# Patient Record
Sex: Male | Born: 1950
Health system: Southern US, Community
[De-identification: ages and names within clinical notes are randomized; demographics above are authoritative.]

## PROBLEM LIST (undated history)

## (undated) DIAGNOSIS — Z8719 Personal history of other diseases of the digestive system: Secondary | ICD-10-CM

## (undated) DIAGNOSIS — C4491 Basal cell carcinoma of skin, unspecified: Secondary | ICD-10-CM

## (undated) DIAGNOSIS — C801 Malignant (primary) neoplasm, unspecified: Secondary | ICD-10-CM

## (undated) DIAGNOSIS — K219 Gastro-esophageal reflux disease without esophagitis: Secondary | ICD-10-CM

## (undated) DIAGNOSIS — E785 Hyperlipidemia, unspecified: Secondary | ICD-10-CM

## (undated) DIAGNOSIS — Z87442 Personal history of urinary calculi: Secondary | ICD-10-CM

## (undated) DIAGNOSIS — J309 Allergic rhinitis, unspecified: Secondary | ICD-10-CM

## (undated) DIAGNOSIS — E7800 Pure hypercholesterolemia, unspecified: Secondary | ICD-10-CM

## (undated) HISTORY — DX: Hyperlipidemia, unspecified: E78.5

## (undated) HISTORY — PX: TONSILLECTOMY: SUR1361

## (undated) HISTORY — DX: Malignant (primary) neoplasm, unspecified: C80.1

## (undated) HISTORY — PX: CHOLECYSTECTOMY: SHX55

## (undated) HISTORY — PX: SHOULDER SURGERY: SHX246

## (undated) HISTORY — DX: Pure hypercholesterolemia, unspecified: E78.00

## (undated) HISTORY — DX: Gastro-esophageal reflux disease without esophagitis: K21.9

## (undated) HISTORY — PX: TRIGGER FINGER RELEASE: SHX641

## (undated) HISTORY — DX: Basal cell carcinoma of skin, unspecified: C44.91

## (undated) HISTORY — DX: Allergic rhinitis, unspecified: J30.9

---

## 1999-12-31 ENCOUNTER — Ambulatory Visit (HOSPITAL_COMMUNITY): Admission: RE | Admit: 1999-12-31 | Discharge: 1999-12-31 | Payer: Self-pay | Admitting: Gastroenterology

## 2001-08-01 ENCOUNTER — Emergency Department (HOSPITAL_COMMUNITY): Admission: EM | Admit: 2001-08-01 | Discharge: 2001-08-01 | Payer: Self-pay | Admitting: *Deleted

## 2001-08-15 ENCOUNTER — Encounter: Payer: Self-pay | Admitting: Gastroenterology

## 2001-08-15 ENCOUNTER — Encounter: Admission: RE | Admit: 2001-08-15 | Discharge: 2001-08-15 | Payer: Self-pay | Admitting: Gastroenterology

## 2001-10-05 ENCOUNTER — Ambulatory Visit (HOSPITAL_BASED_OUTPATIENT_CLINIC_OR_DEPARTMENT_OTHER): Admission: RE | Admit: 2001-10-05 | Discharge: 2001-10-05 | Payer: Self-pay | Admitting: Urology

## 2001-10-05 ENCOUNTER — Encounter: Payer: Self-pay | Admitting: Urology

## 2003-04-25 ENCOUNTER — Ambulatory Visit (HOSPITAL_COMMUNITY): Admission: RE | Admit: 2003-04-25 | Discharge: 2003-04-25 | Payer: Self-pay | Admitting: Gastroenterology

## 2007-05-22 ENCOUNTER — Encounter: Payer: Self-pay | Admitting: Family Medicine

## 2008-05-02 ENCOUNTER — Encounter: Payer: Self-pay | Admitting: Family Medicine

## 2008-05-28 ENCOUNTER — Encounter: Payer: Self-pay | Admitting: Family Medicine

## 2008-12-04 ENCOUNTER — Encounter: Payer: Self-pay | Admitting: Family Medicine

## 2009-09-04 ENCOUNTER — Ambulatory Visit: Payer: Self-pay | Admitting: Family Medicine

## 2009-09-04 DIAGNOSIS — K219 Gastro-esophageal reflux disease without esophagitis: Secondary | ICD-10-CM

## 2009-09-04 DIAGNOSIS — J309 Allergic rhinitis, unspecified: Secondary | ICD-10-CM

## 2009-09-04 DIAGNOSIS — E785 Hyperlipidemia, unspecified: Secondary | ICD-10-CM

## 2009-09-04 HISTORY — DX: Gastro-esophageal reflux disease without esophagitis: K21.9

## 2009-09-04 HISTORY — DX: Hyperlipidemia, unspecified: E78.5

## 2009-09-04 HISTORY — DX: Allergic rhinitis, unspecified: J30.9

## 2010-01-24 DIAGNOSIS — C801 Malignant (primary) neoplasm, unspecified: Secondary | ICD-10-CM

## 2010-01-24 HISTORY — DX: Malignant (primary) neoplasm, unspecified: C80.1

## 2010-02-23 NOTE — Letter (Signed)
Summary: [facility]   Imported By: Sherian Rein 09/30/2009 13:38:33  _____________________________________________________________________  External Attachment:    Type:   Image     Comment:   External Document

## 2010-02-23 NOTE — Letter (Signed)
Summary: [facility]   Imported By: Sherian Rein 09/30/2009 13:37:25  _____________________________________________________________________  External Attachment:    Type:   Image     Comment:   External Document

## 2010-02-23 NOTE — Letter (Signed)
Summary: [facility]   Imported By: Sherian Rein 09/30/2009 13:39:55  _____________________________________________________________________  External Attachment:    Type:   Image     Comment:   External Document

## 2010-02-23 NOTE — Assessment & Plan Note (Signed)
Summary: to be est/njr   Vital Signs:  Patient profile:   60 year old male Height:      66.25 inches Weight:      169 pounds BMI:     27.17 Temp:     98.3 degrees F oral Resp:     16 per minute BP sitting:   120 / 74  (left arm) Cuff size:   regular  Vitals Entered By: Duard Brady LPN (September 04, 2009 1:59 PM)  Nutrition Counseling: Patient's BMI is greater than 25 and therefore counseled on weight management options. CC: new to establish  Is Patient Diabetic? No   History of Present Illness: Here to establish care.  Hx of GERD treated with Dexilant on as needed basis.  Symptoms currently stable. No hx of PUD.  Hx of reported hyperlidemia.  No hx of CAD  Recent rhinitis symptoms.  Freq nasal congestion. Symptoms not consistently relieved with oral  antihistamine.  Preventive Screening-Counseling & Management  Alcohol-Tobacco     Smoking Status: quit  Allergies (verified): 1)  ! Levaquin  Past History:  Family History: Last updated: 09/04/2009 Mother-  CVA and cardiac arrhythmia (?atrial fib)  Social History: Last updated: 09/04/2009 Occupation: Comptroller Married Former Smoker  Risk Factors: Smoking Status: quit (09/04/2009)  Past Medical History: GERD HYPERLIPIDEMIA ALLERGIC RHINITIS  Past Surgical History: Tonsillectomy L shoulder scromoplasty 1986, 2009 PMH-FH-SH reviewed for relevance  Family History: Mother-  CVA and cardiac arrhythmia (?atrial fib)  Social History: Occupation: Comptroller Married Former Smoker Smoking Status:  quit Occupation:  employed  Review of Systems  The patient denies anorexia, fever, weight loss, weight gain, chest pain, syncope, dyspnea on exertion, peripheral edema, prolonged cough, headaches, hemoptysis, abdominal pain, melena, hematochezia, and severe indigestion/heartburn.    Physical Exam  General:  Well-developed,well-nourished,in no acute distress; alert,appropriate and  cooperative throughout examination Head:  Normocephalic and atraumatic without obvious abnormalities. No apparent alopecia or balding. Eyes:  pupils equal, pupils round, and pupils reactive to light.   Mouth:  Oral mucosa and oropharynx without lesions or exudates.  Teeth in good repair. Neck:  No deformities, masses, or tenderness noted. Lungs:  Normal respiratory effort, chest expands symmetrically. Lungs are clear to auscultation, no crackles or wheezes. Heart:  normal rate, regular rhythm, and no murmur.   Extremities:  no edema. Neurologic:  alert & oriented X3, cranial nerves II-XII intact, and strength normal in all extremities.   Cervical Nodes:  No lymphadenopathy noted Psych:  normally interactive, good eye contact, not anxious appearing, and not depressed appearing.     Impression & Recommendations:  Problem # 1:  GERD (ICD-530.81)  His updated medication list for this problem includes:    Dexilant 60 Mg Cpdr (Dexlansoprazole) ..... One by mouth once daily  Problem # 2:  ALLERGIC RHINITIS (ICD-477.9) start Flonase. His updated medication list for this problem includes:    Fluticasone Propionate 50 Mcg/act Susp (Fluticasone propionate) .Marland Kitchen... 2 sprays per nostril once daily  Problem # 3:  DYSLIPIDEMIA (ICD-272.4) schedule CPE and reassess then.  Complete Medication List: 1)  Dexilant 60 Mg Cpdr (Dexlansoprazole) .... One by mouth once daily 2)  Fluticasone Propionate 50 Mcg/act Susp (Fluticasone propionate) .... 2 sprays per nostril once daily  Patient Instructions: 1)  Schedule CPE for October. Prescriptions: FLUTICASONE PROPIONATE 50 MCG/ACT SUSP (FLUTICASONE PROPIONATE) 2 sprays per nostril once daily  #1 x 6   Entered and Authorized by:   [name] MD   Signed by:  [name] MD on 09/04/2009   Method used:   Electronically to        [facility]  [place] [address] (retail)       [address]       [place], [place]  [address]       Ph: [phone] or  [phone]       Fax: [phone]   RxID:   [id number]    Preventive Care Screening  Colonoscopy:    Date:  08/25/2006    Results:  normal

## 2010-02-23 NOTE — Procedures (Signed)
Summary: Ccolonoscopy/[facility]  Ccolonoscopy/[facility]   Imported By: Sherian Rein 09/30/2009 13:36:00  _____________________________________________________________________  External Attachment:    Type:   Image     Comment:   External Document

## 2010-04-07 ENCOUNTER — Other Ambulatory Visit: Payer: Self-pay | Admitting: Family Medicine

## 2010-04-07 ENCOUNTER — Encounter (INDEPENDENT_AMBULATORY_CARE_PROVIDER_SITE_OTHER): Payer: Self-pay | Admitting: *Deleted

## 2010-04-07 ENCOUNTER — Other Ambulatory Visit: Payer: Self-pay

## 2010-04-07 DIAGNOSIS — Z0389 Encounter for observation for other suspected diseases and conditions ruled out: Secondary | ICD-10-CM

## 2010-04-07 DIAGNOSIS — Z0000 Encounter for general adult medical examination without abnormal findings: Secondary | ICD-10-CM

## 2010-04-07 DIAGNOSIS — E785 Hyperlipidemia, unspecified: Secondary | ICD-10-CM

## 2010-04-07 LAB — CBC WITH DIFFERENTIAL/PLATELET
Basophils Absolute: 0 10*3/uL (ref 0.0–0.1)
Basophils Relative: 0.2 % (ref 0.0–3.0)
Eosinophils Relative: 2.4 % (ref 0.0–5.0)
HCT: 43.5 % (ref 39.0–52.0)
Hemoglobin: 15.1 g/dL (ref 13.0–17.0)
Lymphocytes Relative: 30.6 % (ref 12.0–46.0)
Lymphs Abs: 1.7 10*3/uL (ref 0.7–4.0)
MCHC: 34.8 g/dL (ref 30.0–36.0)
MCV: 93.1 fl (ref 78.0–100.0)
Monocytes Relative: 7.3 % (ref 3.0–12.0)
Neutro Abs: 3.3 10*3/uL (ref 1.4–7.7)
RBC: 4.67 Mil/uL (ref 4.22–5.81)
RDW: 13 % (ref 11.5–14.6)
WBC: 5.5 10*3/uL (ref 4.5–10.5)

## 2010-04-07 LAB — URINALYSIS
Bilirubin Urine: NEGATIVE
Hgb urine dipstick: NEGATIVE
Ketones, ur: NEGATIVE
Leukocytes, UA: NEGATIVE
Nitrite: NEGATIVE
Specific Gravity, Urine: 1.015 (ref 1.000–1.030)
Total Protein, Urine: NEGATIVE
Urine Glucose: NEGATIVE
Urobilinogen, UA: 4 (ref 0.0–1.0)
pH: 8 (ref 5.0–8.0)

## 2010-04-07 LAB — HEPATIC FUNCTION PANEL
ALT: 16 U/L (ref 0–53)
AST: 21 U/L (ref 0–37)
Albumin: 4.1 g/dL (ref 3.5–5.2)
Alkaline Phosphatase: 55 U/L (ref 39–117)
Total Protein: 6.7 g/dL (ref 6.0–8.3)

## 2010-04-07 LAB — TSH: TSH: 1.09 u[IU]/mL (ref 0.35–5.50)

## 2010-04-07 LAB — BASIC METABOLIC PANEL
BUN: 18 mg/dL (ref 6–23)
Calcium: 9 mg/dL (ref 8.4–10.5)
Chloride: 106 mEq/L (ref 96–112)
Creatinine, Ser: 1 mg/dL (ref 0.4–1.5)
Glucose, Bld: 89 mg/dL (ref 70–99)
Potassium: 5.1 mEq/L (ref 3.5–5.1)
Sodium: 140 mEq/L (ref 135–145)

## 2010-04-07 LAB — LIPID PANEL
Cholesterol: 248 mg/dL — ABNORMAL HIGH (ref 0–200)
HDL: 35.7 mg/dL — ABNORMAL LOW (ref >39.00)
Total CHOL/HDL Ratio: 7
Triglycerides: 138 mg/dL (ref 0.0–149.0)

## 2010-04-20 ENCOUNTER — Encounter: Payer: Self-pay | Admitting: Family Medicine

## 2010-04-21 ENCOUNTER — Encounter: Payer: Self-pay | Admitting: Family Medicine

## 2010-04-21 ENCOUNTER — Ambulatory Visit (INDEPENDENT_AMBULATORY_CARE_PROVIDER_SITE_OTHER): Payer: PRIVATE HEALTH INSURANCE | Admitting: Family Medicine

## 2010-04-21 DIAGNOSIS — D239 Other benign neoplasm of skin, unspecified: Secondary | ICD-10-CM

## 2010-04-21 DIAGNOSIS — Z0000 Encounter for general adult medical examination without abnormal findings: Secondary | ICD-10-CM

## 2010-04-21 DIAGNOSIS — D229 Melanocytic nevi, unspecified: Secondary | ICD-10-CM

## 2010-04-21 DIAGNOSIS — E785 Hyperlipidemia, unspecified: Secondary | ICD-10-CM

## 2010-04-21 MED ORDER — ATORVASTATIN CALCIUM 20 MG PO TABS: 20.0000 mg | ORAL_TABLET | Freq: Every day | ORAL | Status: DC

## 2010-04-21 NOTE — Progress Notes (Signed)
  Subjective:    Patient ID: Dennis Villarreal, male    DOB: [birth year], 60 y.o.   MRN: [id number]  HPI Patient here for complete physical examination. Colonoscopy 2008. Last tetanus 2008.  Patient does not exercise regularly. Diet low in saturated fat. Family history reviewed. Mother died of complications of stroke. No history of premature CAD. The patient is an ex-smoker.  Father had melanoma.  Patient relates a several year history of pigmented lesion left side which he thinks has grown over the past 5 years but no itching or bleeding.  Patient has history of GERD which is controlled with PPI. Had recent endoscopy back in 2008. Seasonal allergies generally controlled with over-the-counter medication.   Review of Systems  Constitutional: Negative for fever, activity change, appetite change and fatigue.  HENT: Negative for ear pain, congestion and trouble swallowing.   Eyes: Negative for pain and visual disturbance.  Respiratory: Negative for cough, shortness of breath and wheezing.   Cardiovascular: Negative for chest pain and palpitations.  Gastrointestinal: Negative for nausea, vomiting, abdominal pain, diarrhea, constipation, blood in stool, abdominal distention and rectal pain.  Genitourinary: Negative for dysuria, hematuria and testicular pain.  Musculoskeletal: Negative for joint swelling and arthralgias.  Skin: Negative for rash.  Neurological: Negative for dizziness, syncope and headaches.  Hematological: Negative for adenopathy.  Psychiatric/Behavioral: Negative for confusion and dysphoric mood.       Objective:   Physical Exam  Constitutional: He is oriented to person, place, and time. He appears well-developed and well-nourished. No distress.  HENT:  Head: Normocephalic and atraumatic.  Right Ear: External ear normal.  Left Ear: External ear normal.  Mouth/Throat: Oropharynx is clear and moist.  Eyes: Conjunctivae and EOM are normal. Pupils are equal, round, and  reactive to light.  Neck: Normal range of motion. Neck supple. No thyromegaly present.  Cardiovascular: Normal rate, regular rhythm and normal heart sounds.   No murmur heard. Pulmonary/Chest: No respiratory distress. He has no wheezes. He has no rales.  Abdominal: Soft. Bowel sounds are normal. He exhibits no distension and no mass. There is no tenderness. There is no rebound and no guarding.  Musculoskeletal: He exhibits no edema.  Lymphadenopathy:    He has no cervical adenopathy.  Neurological: He is alert and oriented to person, place, and time. He displays normal reflexes. No cranial nerve deficit.  Skin: No rash noted.       Patient has pigmented lesions oval in shape left side which is about 9 mm x 1.5 cm. Fairly clear border but does have significant color variegation. He also states is growing in size of the past few years  Psychiatric: He has a normal mood and affect.          Assessment & Plan:  #1 health maintenance issues addressed. Discussed sun protection. Colonoscopy up to date. Labs significant for elevated total cholesterol and LDL with low HDL. See below #2 atypical pigmented lesion left side a patient with positive family history of melanoma. Given the growth pattern and abnormal features recommend further evaluation dermatologist for consideration of excision #3 hyperlipidemia check. Start Lipitor 20 mg daily and reassess lipids 2 months

## 2010-05-12 ENCOUNTER — Other Ambulatory Visit: Payer: Self-pay | Admitting: Family Medicine

## 2010-05-14 ENCOUNTER — Encounter: Payer: Self-pay | Admitting: Family Medicine

## 2010-05-19 ENCOUNTER — Encounter: Payer: Self-pay | Admitting: Family Medicine

## 2010-06-11 NOTE — Procedures (Signed)
[facility]  Patient:    Dennis Villarreal, Dennis Villarreal                       MRN: [id number] Proc. Date: 12/31/99 Adm. Date:  [id number] Attending:  Louie Bun CC:         Doreatha Lew, M.D.   Procedure Report  PROCEDURE:  Esophagogastroduodenoscopy with biopsy.  INDICATIONS FOR PROCEDURE:  Chronic gastroesophageal reflux disease, requiring daily proton pump inhibitor for the last three years.  DESCRIPTION OF PROCEDURE:  The patient was placed in the left lateral decubitus position and placed on the pulse monitor with continuous low-flow oxygen delivered by nasal cannula.  He was sedated with 50 mg IV Demerol and 5 mg IV Versed.  The Olympus video endoscope was advanced under direct vision into the oropharynx and esophagus.  The esophagus was straight and of normal caliber. At the squamocolumnar line at 36 cm, there is a 2 cm hiatal hernia.  The GE junction appeared somewhat patchulous, but there is no Barretts esophagus or visible erosions or endoscopic evidence of esophagitis.  No ring or stricture was present.  The stomach was entered, a small amount of liquid secretions were suctioned from the fundus.  Retroflexed view of the cardia confirmed the hiatal hernia and was otherwise unremarkable.  The fundus body, antrum and pylorus all appeared normal.  The duodenum was entered.  Both the bulb and second portion are well inspected and appear to be within normal limits.  The endoscope was then withdrawn into the stomach and a CLOtest obtained.  The scope was then withdrawn and the patient returned to the recovery room in stable condition.  He tolerated the procedure well and there were no immediate complications.  IMPRESSION:  A 2 cm hiatal hernia with patchulous gastroesophageal junction.  PLAN:  Continue proton pump inhibitor.  Reinforce dietary and lifestyle modifications for reflux.  Await CLOtest. DD:  12/31/99 TD:  01/01/00 Job:  [id number]

## 2010-06-11 NOTE — Op Note (Signed)
NAME:  Dennis Villarreal, Dennis Villarreal                        ACCOUNT NO.:  [id number]   MEDICAL RECORD NO.:  [id number]                   PATIENT TYPE:  AMB   LOCATION:  ENDO                                 FACILITY:  [facility]   PHYSICIAN:  John C. Madilyn Fireman, M.D.                 DATE OF BIRTH:  [birth year]   DATE OF PROCEDURE:  04/25/2003  DATE OF DISCHARGE:                                 OPERATIVE REPORT   PROCEDURE:  Colonoscopy.   INDICATION FOR PROCEDURE:  Family history of colon cancer in a second-degree  relative in a 60 year old patient with no prior colon screening.   PROCEDURE:  The patient was placed in the left lateral decubitus position  and placed no the pulse monitor with continuous low-flow oxygen delivered by  nasal cannula.  He was sedated with 87.5 mg IV fentanyl and 9 mg of IV  Versed.  The Olympus video colonoscope was inserted into the rectum and  advanced to the cecum, confirmed by transillumination of McBurney's point  and visualization of the ileocecal valve and appendiceal orifice.  The prep  was excellent.  The cecum, ascending, transverse, descending, and sigmoid  colon all appeared normal with no masses, polyps, diverticula, or other  mucosal abnormalities.  The rectum likewise appeared normal and retroflexed  view of the anus revealed no obvious internal hemorrhoids.  The colonoscope  was then withdrawn and the patient returned to the recovery room in stable  condition.  He tolerated the procedure well, and there were no immediate  complications.   IMPRESSION:  Normal colonoscopy.   PLAN:  Repeat colonoscopy in five years.                                               John C. Madilyn Fireman, M.D.    JCH/MEDQ  D:  04/25/2003  T:  04/26/2003  Job:  [id number]   cc:   Duwayne Heck L. Mahaffey, M.D.  [address]  [place]  [place] [address]  Fax: [phone]

## 2010-06-11 NOTE — Op Note (Signed)
Dennis Villarreal, Dennis Villarreal                       ACCOUNT NO.:  [id number]   MEDICAL RECORD NO.:  [id number]                   PATIENT TYPE:  AMB   LOCATION:  [facility]                                 FACILITY:  [facility]   PHYSICIAN:  Claudette Laws, M.D.               DATE OF BIRTH:  1950-07-03   DATE OF PROCEDURE:  10/05/2001  DATE OF DISCHARGE:                                 OPERATIVE REPORT   PREOPERATIVE DIAGNOSES:  Distal right ureteral calculus.   POSTOPERATIVE DIAGNOSES:  Distal right ureteral calculus, renal colic.   OPERATION:  Cystoscopy, balloon dilation right distal ureter, right rigid  ureteroscopy and stone basket extraction distal right ureteral calculus and  insertion of a 6 French 26 cm double J stent.   SURGEON:  Claudette Laws, M.D.   HISTORY OF PRESENT ILLNESS:  This patient who I last saw about six weeks ago  in the office with a small distal right ureteral calculus and intermittent  renal colic was seen, evaluated and felt that he could pass the stone  spontaneously. However since then, he has not passed the stone, he has had  intermittent symptoms of renal colic, and requested that we go ahead with a  procedure. I recommended ureteroscopy with stone basket. He understands and  agrees to the proposed surgery. I mentioned complications to him such as  postop ureteral stricture, inadvertent perforation of the ureter or  inability to extract the stone for whatever reason. I told him, he would be  left with a double J stent postop for three to four days postop. He  understands and agrees to the proposed surgery. He comes in as an outpatient  procedure.   DESCRIPTION OF PROCEDURE:  The patient was prepped and draped in the dorsal  lithotomy position under LMA anesthesia. Cystoscopy was performed with a 22  French rigid cystoscope. He had a normal anterior urethra, some early BPH,  elevation of the median lobe, normal bladder smooth, no tumors and no  calculi, normal  ureteral orifices.   Initially I intubated the distal right ureter with a 0.35 Glidewire. Using  fluoroscopic control, it was passed up to the kidney. Initially I tried to  intubate the ureter with a 6 French rigid ureteroscope but it was  unsuccessful so then we went ahead and balloon dilated the distal ureter a  with 4 cm balloon for about 5 minutes. I then reintroduced the ureteroscope  under direct vision and the stone was visualized. Using a small nitinol  basket, the stone was retrieved in the first pass. I then back loaded the  guidewire over the cystoscope and passed up a 6 French 26 cm double J stent  with fluoroscopic control. The proximal end was curled up in the renal  pelvis, the distal end was curled up in the bladder with the string attached  which was brought out through the urethra. All instruments were removed, the  bladder was emptied. The string was taped to the penis with pink tinctured  Benzoin and pink tape. A B&O suppository was placed per rectum for bladder  spasm and Xylocaine was introduced per urethra for anesthetic purposes. The  patient was then taken back to the recovery room in satisfactory condition.  The patient will go home now with plans to remove the double J stent via the  string in about three days.                                                Claudette Laws, M.D.   RFS/MEDQ  D:  10/05/2001  T:  10/06/2001  Job:  [id number]

## 2010-06-11 NOTE — Procedures (Signed)
[facility]  Patient:    RAYDAN, SCHLABACH                       MRN: [id number] Proc. Date: 12/31/99 Adm. Date:  [id number] Attending:  Louie Bun CC:         Doreatha Lew, M.D.   Procedure Report  PROCEDURE:  Flexible sigmoidoscopy.  INDICATIONS FOR PROCEDURE:  Constipation in a 60 year old patient with a history of colon cancer in a second-degree relative.  DESCRIPTION OF PROCEDURE:  The patient was placed in the left lateral decubitus position and placed on the pulse monitor with continuous low-flow oxygen delivered by nasal cannula.  He was sedated with 50 mg IV Demerol and 5 mg IV Versed for the previous EGD, and then was given 2 mg more of Versed for this procedure.  The Olympus video colonoscope was inserted into the rectum and advanced to approximately 45 cm.  Between 35 cm and 45 cm there was some solid stool which precluded visualization of some areas, and above 45 cm the prep was inadequate; the scope was not advanced beyond this point.  Otherwise, the descending, sigmoid and rectum appeared normal down to the anus.  There a retroflexed view revealed no obvious internal hemorrhoids.  The colonoscope was then withdrawn and the patient returned to the recovery room in stable condition.  He tolerated the procedure well and there were no immediate complications.  IMPRESSION:  Basically normal sigmoidoscopy to 45 cm. DD:  12/31/99 TD:  01/01/00 Job: [id number]

## 2010-06-22 ENCOUNTER — Encounter: Payer: Self-pay | Admitting: Family Medicine

## 2010-06-22 ENCOUNTER — Ambulatory Visit (INDEPENDENT_AMBULATORY_CARE_PROVIDER_SITE_OTHER): Payer: No Typology Code available for payment source | Admitting: Family Medicine

## 2010-06-22 VITALS — BP 110/78 | Temp 98.6°F | Wt 168.0 lb

## 2010-06-22 DIAGNOSIS — E785 Hyperlipidemia, unspecified: Secondary | ICD-10-CM

## 2010-06-22 LAB — LIPID PANEL
Cholesterol: 163 mg/dL (ref 0–200)
LDL Cholesterol: 98 mg/dL (ref 0–99)
Triglycerides: 118 mg/dL (ref 0.0–149.0)

## 2010-06-22 LAB — HEPATIC FUNCTION PANEL
AST: 24 U/L (ref 0–37)
Alkaline Phosphatase: 57 U/L (ref 39–117)
Bilirubin, Direct: 0.1 mg/dL (ref 0.0–0.3)
Total Protein: 6.6 g/dL (ref 6.0–8.3)

## 2010-06-22 NOTE — Progress Notes (Signed)
  Subjective:    Patient ID: Dennis Villarreal, male    DOB: 1950/05/19, 60 y.o.   MRN: [id number]  HPI Patient seen for followup hyperlipidemia. Recent complete physical. Total cholesterol 248. LDL 175. Initiated Lipitor 20 mg daily. Patient tolerating well. Compliant with therapy. No side effects. No prior history of CAD. Patient is an ex-smoker. Moderate risk factors.  Abnormal pigmented lesion noted at physical. Patient sent to dermatology and this turned out to be melanoma. He had some complications of post operative wound infection currently treated with cephalexin. Reportedly had a thickness of 0.4 mm   Review of Systems  Constitutional: Negative for activity change, fatigue and unexpected weight change.  Respiratory: Negative for shortness of breath.   Cardiovascular: Negative for chest pain and palpitations.       Objective:   Physical Exam  Constitutional: He is oriented to person, place, and time. He appears well-developed and well-nourished. No distress.  HENT:  Head: Normocephalic.  Right Ear: External ear normal.  Left Ear: External ear normal.  Mouth/Throat: Oropharynx is clear and moist. No oropharyngeal exudate.  Cardiovascular: Normal rate, regular rhythm and normal heart sounds.   No murmur heard. Pulmonary/Chest: Effort normal and breath sounds normal. No respiratory distress. He has no wheezes. He has no rales.  Musculoskeletal: He exhibits no edema.  Neurological: He is alert and oriented to person, place, and time. No cranial nerve deficit.          Assessment & Plan:  #1 hyperlipidemia. Recheck lipid and hepatic panel #2 malignant melanoma left chest wall. Followed by dermatology

## 2010-06-24 NOTE — Progress Notes (Signed)
Quick Note:  Pt informed ______ 

## 2010-07-29 ENCOUNTER — Ambulatory Visit: Payer: No Typology Code available for payment source | Admitting: Family Medicine

## 2010-07-30 ENCOUNTER — Ambulatory Visit: Payer: No Typology Code available for payment source | Admitting: Family Medicine

## 2010-07-30 ENCOUNTER — Encounter: Payer: Self-pay | Admitting: Family Medicine

## 2010-07-30 ENCOUNTER — Ambulatory Visit (INDEPENDENT_AMBULATORY_CARE_PROVIDER_SITE_OTHER): Payer: No Typology Code available for payment source | Admitting: Family Medicine

## 2010-07-30 DIAGNOSIS — K5900 Constipation, unspecified: Secondary | ICD-10-CM

## 2010-07-30 NOTE — Progress Notes (Signed)
  Subjective:    Patient ID: Dennis Villarreal, male    DOB: 03/30/1950, 60 y.o.   MRN: [id number]  HPI  Constipation off and on for 10 years. Especially worse over the past couple months. Drinks plenty of fluids. No regular exercise. TSH normal in March. Colonoscopy 2008. Eats fair number of high fiber foods. He has not taken any stool softeners or laxatives at this time. No bloody stools. Good appetite. No weight loss. No anticholinergic drugs.  Review of Systems  Constitutional: Negative for fever, chills, appetite change and unexpected weight change.  Gastrointestinal: Positive for constipation. Negative for nausea, vomiting, abdominal pain, blood in stool and abdominal distention.       Objective:   Physical Exam  Constitutional: He appears well-developed and well-nourished. No distress.  Cardiovascular: Normal rate, regular rhythm and normal heart sounds.   No murmur heard. Pulmonary/Chest: Effort normal and breath sounds normal. No respiratory distress. He has no wheezes. He has no rales. He exhibits no tenderness.  Abdominal: Soft. Bowel sounds are normal. He exhibits no distension and no mass. There is no tenderness. There is no rebound and no guarding.  Genitourinary:       Rectal exam normal sphincter tone. No impaction. No mass palpated          Assessment & Plan:  Constipation. Discussed measures including exercise, plenty of fluids. Increase fiber consumption, short-term use of senna or MiraLax

## 2010-07-30 NOTE — Patient Instructions (Signed)
Constipation in Adults Constipation is having fewer than 2 bowel movements per week. Usually, the stools are hard. As we grow older, constipation is more common. If you try to fix constipation with laxatives, the problem may get worse. This is because laxatives taken over a long period of time make the colon muscles weaker. A low-fiber diet, not taking in enough fluids, and taking some medicines may make these problems worse. MEDICATIONS THAT MAY CAUSE CONSTIPATION  Water pills (diuretics).  Calcium channel blockers (used to control blood pressure and for the heart).   Certain pain medicines (narcotics).   Anticholinergics.  Anti-inflammatory agents.   Antacids that contain aluminum.   DISEASES THAT CONTRIBUTE TO CONSTIPATION  Diabetes.  Parkinson's disease.   Dementia.   Stroke.  Depression.   Illnesses that cause problems with salt and water metabolism.   HOME CARE INSTRUCTIONS  Constipation is usually best cared for without medicines. Increasing dietary fiber and eating more fruits and vegetables is the best way to manage constipation.   Slowly increase fiber intake to 25 to 38 grams per day. Whole grains, fruits, vegetables, and legumes are good sources of fiber. A dietitian can further help you incorporate high-fiber foods into your diet.   Drink enough water and fluids to keep your urine clear or pale yellow.   A fiber supplement may be added to your diet if you cannot get enough fiber from foods.   Increasing your activities also helps improve regularity.   Suppositories, as suggested by your caregiver, will also help. If you are using antacids, such as aluminum or calcium containing products, it will be helpful to switch to products containing magnesium if your caregiver says it is okay.   If you have been given a liquid injection (enema) today, this is only a temporary measure. It should not be relied on for treatment of longstanding (chronic) constipation.    Stronger measures, such as magnesium sulfate, should be avoided if possible. This may cause uncontrollable diarrhea. Using magnesium sulfate may not allow you time to make it to the bathroom.  SEEK IMMEDIATE MEDICAL CARE IF:  There is bright red blood in the stool.   The constipation stays for more than 4 days.   There is belly (abdominal) or rectal pain.   You do not seem to be getting better.   You have any questions or concerns.  MAKE SURE YOU:  Understand these instructions.   Will watch your condition.   Will get help right away if you are not doing well or get worse.  Document Released: 10/09/2003 Document Re-Released: 04/06/2009 [facility] Patient Information 2011 [place], [place].  Try to exercise more consistently.  Consider over the counter such as Senna or Miralax.

## 2010-09-28 ENCOUNTER — Telehealth: Payer: Self-pay | Admitting: Family Medicine

## 2010-09-28 NOTE — Telephone Encounter (Signed)
Pt informed of referral

## 2010-09-28 NOTE — Telephone Encounter (Signed)
Will go ahead and set up with Dr Narda Bonds (ENT)

## 2010-09-28 NOTE — Telephone Encounter (Signed)
Pt requesting a referral for the constant ringing in his ears. ENT? Please call pt if necessary.

## 2010-11-01 ENCOUNTER — Other Ambulatory Visit: Payer: Self-pay | Admitting: Family Medicine

## 2010-11-11 ENCOUNTER — Telehealth: Payer: Self-pay | Admitting: Family Medicine

## 2010-11-11 MED ORDER — PANTOPRAZOLE SODIUM 40 MG PO TBEC: 40.0000 mg | DELAYED_RELEASE_TABLET | Freq: Every day | ORAL | Status: DC

## 2010-11-11 NOTE — Telephone Encounter (Signed)
Per Tedd Sias, this patient's Prior Auth for Dexilant will be denied unless: patient tries (or has tried) Nexium AND one of the following: protonix, omeprazole, pantoprazole, Zegrid OTC, Prevacid OTC. I did not see anything documented to say that he has done this. Please let me know if pt has tried & failed Nexium & one of the above. Thanks.

## 2010-11-11 NOTE — Telephone Encounter (Signed)
Pt informed on home VM Dr Caryl Never has decided to try Protonix 40 mg as his insurance has denied Dexilant.  Protonix sent to pt pharmacy, pt informed of change in meds.

## 2010-11-11 NOTE — Telephone Encounter (Signed)
Let's try Protonix (pantoprozole) 40 mg po qd #90 with 3 refills

## 2010-12-14 ENCOUNTER — Ambulatory Visit: Payer: PRIVATE HEALTH INSURANCE | Admitting: Family Medicine

## 2010-12-24 ENCOUNTER — Ambulatory Visit: Payer: No Typology Code available for payment source | Admitting: Family Medicine

## 2011-01-22 ENCOUNTER — Ambulatory Visit (INDEPENDENT_AMBULATORY_CARE_PROVIDER_SITE_OTHER): Payer: PRIVATE HEALTH INSURANCE | Admitting: Internal Medicine

## 2011-01-22 ENCOUNTER — Encounter: Payer: Self-pay | Admitting: Internal Medicine

## 2011-01-22 VITALS — BP 110/72 | HR 75 | Temp 99.0°F | Resp 16 | Wt 166.8 lb

## 2011-01-22 DIAGNOSIS — J329 Chronic sinusitis, unspecified: Secondary | ICD-10-CM

## 2011-01-22 MED ORDER — AMOXICILLIN-POT CLAVULANATE 875-125 MG PO TABS: 1.0000 | ORAL_TABLET | Freq: Two times a day (BID) | ORAL | Status: AC

## 2011-01-22 MED ORDER — PROMETHAZINE-CODEINE 6.25-10 MG/5ML PO SYRP: 5.0000 mL | ORAL_SOLUTION | ORAL | Status: AC | PRN

## 2011-01-22 NOTE — Progress Notes (Signed)
  Subjective:    Patient ID: Dennis Villarreal, male    DOB: September 24, 1950, 60 y.o.   MRN: [id number]  HPI Dennis Villarreal presents with a 4 day h/o cough that productive of thick, bitter sputum; he has had head congestion with pain behind the sinuses, no SOB, mild nausea, anorexia, no sore throat. No documented fever at home but low grade fever in the office. Feels like a wet bubble in the ear, no drainage from the [facility].  Past Medical History  Diagnosis Date  . DYSLIPIDEMIA 09/04/2009  . ALLERGIC RHINITIS 09/04/2009  . GERD 09/04/2009   Past Surgical History  Procedure Date  . Tonsillectomy   . Shoulder surgery 1986, 2009    left shoulder scromoplasty   Family History  Problem Relation Age of Onset  . Heart disease Mother     CVA and cardiac arrhythmia, ? atrial fib  . Stroke Mother    History   Social History  . Marital Status: Married    Spouse Name: N/A    Number of Children: N/A  . Years of Education: N/A   Occupational History  . Not on file.   Social History Main Topics  . Smoking status: Former Smoker -- 1.0 packs/day for 20 years    Types: Cigarettes    Quit date: 04/20/1981  . Smokeless tobacco: Not on file  . Alcohol Use: Yes  . Drug Use: Not on file  . Sexually Active: Not on file   Other Topics Concern  . Not on file   Social History Narrative  . No narrative on file       Review of Systems System review is negative for any constitutional, cardiac, pulmonary, GI or neuro symptoms or complaints other than as described in the HPI.     Objective:   Physical Exam Vitals reviewed - low grade fever Gen'l - WNWD white man in no acute distress HEENT - mild sinus discomfort to exam, EACs/TMs normal, throat w/o erythema Neck - supple Nodes - negative cervical supraclavicular region Chest - good breath sounds, no rales or wheezes, no increased work of breathing Cor - RRR, 2 + radial pulse        Assessment & Plan:  URI/sinus - looks like a bacterial  infection with congestion and fever.  Plan - augmentin bid x 7, prom/cod 1 tsp q 6, supportive care including stove top vaporizer treatments.

## 2011-01-22 NOTE — Patient Instructions (Signed)
Upper respiratory infection / sinus - lungs are clear with no signs of pneumonia or bronchitis. Plan - Augmentin twice a day for 7 days; promethazine with codeine 1 tsp every 6 hours for cough; hydrate; tylenol for fever and aches - up to 3,000 mg per day maximum dose.    Sinusitis Sinuses are air pockets within the bones of your face. The growth of bacteria within a sinus leads to infection. The infection prevents the sinuses from draining. This infection is called sinusitis. SYMPTOMS   There will be different areas of pain depending on which sinuses have become infected.  The maxillary sinuses often produce pain beneath the eyes.     Frontal sinusitis may cause pain in the middle of the forehead and above the eyes.  Other problems (symptoms) include:  Toothaches.     Colored, pus-like (purulent) drainage from the nose.     Swelling, warmth, and tenderness over the sinus areas may be signs of infection.  TREATMENT   Sinusitis is most often determined by an exam.X-rays may be taken. If x-rays have been taken, make sure you obtain your results or find out how you are to obtain them. Your caregiver may give you medications (antibiotics). These are medications that will help kill the bacteria causing the infection. You may also be given a medication (decongestant) that helps to reduce sinus swelling.   HOME CARE INSTRUCTIONS    Only take over-the-counter or prescription medicines for pain, discomfort, or fever as directed by your caregiver.     Drink extra fluids. Fluids help thin the mucus so your sinuses can drain more easily.     Applying either moist heat or ice packs to the sinus areas may help relieve discomfort.     Use saline nasal sprays to help moisten your sinuses. The sprays can be found at your local drugstore.  SEEK IMMEDIATE MEDICAL CARE IF:  You have a fever.     You have increasing pain, severe headaches, or toothache.     You have nausea, vomiting, or drowsiness.       You develop unusual swelling around the face or trouble seeing.  MAKE SURE YOU:    Understand these instructions.     Will watch your condition.     Will get help right away if you are not doing well or get worse.  Document Released: 01/10/2005 Document Revised: 09/22/2010 Document Reviewed: 08/09/2006 [facility] Patient Information 2012 [place], [place].

## 2011-05-13 ENCOUNTER — Other Ambulatory Visit: Payer: Self-pay | Admitting: Family Medicine

## 2011-05-17 ENCOUNTER — Other Ambulatory Visit: Payer: Self-pay | Admitting: Family Medicine

## 2011-06-17 ENCOUNTER — Other Ambulatory Visit (INDEPENDENT_AMBULATORY_CARE_PROVIDER_SITE_OTHER): Payer: PRIVATE HEALTH INSURANCE

## 2011-06-17 DIAGNOSIS — Z0000 Encounter for general adult medical examination without abnormal findings: Secondary | ICD-10-CM

## 2011-06-17 LAB — TSH: TSH: 1.2 u[IU]/mL (ref 0.35–5.50)

## 2011-06-17 LAB — CBC WITH DIFFERENTIAL/PLATELET
Basophils Relative: 0.2 % (ref 0.0–3.0)
Eosinophils Relative: 2.2 % (ref 0.0–5.0)
HCT: 44.1 % (ref 39.0–52.0)
Lymphs Abs: 1.8 10*3/uL (ref 0.7–4.0)
MCV: 93.8 fl (ref 78.0–100.0)
Monocytes Absolute: 0.4 10*3/uL (ref 0.1–1.0)
Platelets: 163 10*3/uL (ref 150.0–400.0)
RBC: 4.7 Mil/uL (ref 4.22–5.81)
WBC: 5.9 10*3/uL (ref 4.5–10.5)

## 2011-06-17 LAB — LIPID PANEL: Cholesterol: 150 mg/dL (ref 0–200)

## 2011-06-17 LAB — POCT URINALYSIS DIPSTICK
Glucose, UA: NEGATIVE
Ketones, UA: NEGATIVE
Spec Grav, UA: 1.02
Urobilinogen, UA: 0.2

## 2011-06-17 LAB — BASIC METABOLIC PANEL
BUN: 14 mg/dL (ref 6–23)
CO2: 27 mEq/L (ref 19–32)
Chloride: 109 mEq/L (ref 96–112)
Potassium: 4.6 mEq/L (ref 3.5–5.1)

## 2011-06-17 LAB — HEPATIC FUNCTION PANEL
ALT: 18 U/L (ref 0–53)
Total Protein: 6.8 g/dL (ref 6.0–8.3)

## 2011-06-17 LAB — PSA: PSA: 1.43 ng/mL (ref 0.10–4.00)

## 2011-06-30 ENCOUNTER — Encounter: Payer: Self-pay | Admitting: Family Medicine

## 2011-06-30 ENCOUNTER — Ambulatory Visit (INDEPENDENT_AMBULATORY_CARE_PROVIDER_SITE_OTHER): Payer: PRIVATE HEALTH INSURANCE | Admitting: Family Medicine

## 2011-06-30 VITALS — BP 110/82 | HR 72 | Temp 98.4°F | Resp 12 | Ht 66.0 in | Wt 168.0 lb

## 2011-06-30 DIAGNOSIS — Z85820 Personal history of malignant melanoma of skin: Secondary | ICD-10-CM

## 2011-06-30 DIAGNOSIS — Z0000 Encounter for general adult medical examination without abnormal findings: Secondary | ICD-10-CM

## 2011-06-30 NOTE — Patient Instructions (Signed)
Check on insurance coverage for shingles vaccine Check on last tetanus.  This vaccine should be given every 10 years.

## 2011-06-30 NOTE — Progress Notes (Signed)
  Subjective:    Patient ID: Dennis Villarreal, male    DOB: 1950-06-08, 61 y.o.   MRN: [id number]  HPI  Patient here for complete physical. We noted atypical pigmented lesion left back last year he was refer to dermatologist and this proved to be melanoma. 0.4 mm depth. Elliptical excision. Sees dermatologist regularly. Other problems are history of hyperlipidemia, GERD, allergic rhinitis. GERD symptoms well controlled with Protonix.  Colonoscopy 2008. Last tetanus unknown he thinks less than 10 years ago. No history of shingles vaccine. Brother diagnosed with prostate cancer in the past year  Past Medical History  Diagnosis Date  . DYSLIPIDEMIA 09/04/2009  . ALLERGIC RHINITIS 09/04/2009  . GERD 09/04/2009  . Cancer 2012    melanoma   Past Surgical History  Procedure Date  . Tonsillectomy   . Shoulder surgery 1986, 2009    left shoulder scromoplasty    reports that he quit smoking about 30 years ago. His smoking use included Cigarettes. He has a 20 pack-year smoking history. He does not have any smokeless tobacco history on file. He reports that he drinks alcohol. His drug history not on file. family history includes Heart disease in his mother and Stroke in his mother. Allergies  Allergen Reactions  . Levofloxacin     REACTION: hives and GI upset      Review of Systems  Constitutional: Negative for fever, activity change, appetite change and fatigue.  HENT: Negative for ear pain, congestion and trouble swallowing.   Eyes: Negative for pain and visual disturbance.  Respiratory: Negative for cough, shortness of breath and wheezing.   Cardiovascular: Negative for chest pain and palpitations.  Gastrointestinal: Negative for nausea, vomiting, abdominal pain, diarrhea, constipation, blood in stool, abdominal distention and rectal pain.  Genitourinary: Negative for dysuria, hematuria and testicular pain.  Musculoskeletal: Negative for joint swelling and arthralgias.  Skin: Negative  for rash.  Neurological: Negative for dizziness, syncope and headaches.  Hematological: Negative for adenopathy.  Psychiatric/Behavioral: Negative for confusion and dysphoric mood.       Objective:   Physical Exam  Constitutional: He is oriented to person, place, and time. He appears well-developed and well-nourished. No distress.  HENT:  Head: Normocephalic and atraumatic.  Right Ear: External ear normal.  Left Ear: External ear normal.  Mouth/Throat: Oropharynx is clear and moist.  Eyes: Conjunctivae and EOM are normal. Pupils are equal, round, and reactive to light.  Neck: Normal range of motion. Neck supple. No thyromegaly present.  Cardiovascular: Normal rate, regular rhythm and normal heart sounds.   No murmur heard. Pulmonary/Chest: No respiratory distress. He has no wheezes. He has no rales.  Abdominal: Soft. Bowel sounds are normal. He exhibits no distension and no mass. There is no tenderness. There is no rebound and no guarding.  Musculoskeletal: He exhibits no edema.  Lymphadenopathy:    He has no cervical adenopathy.  Neurological: He is alert and oriented to person, place, and time. He displays normal reflexes. No cranial nerve deficit.  Skin: No rash noted.  Psychiatric: He has a normal mood and affect.          Assessment & Plan:  Complete physical. Check on insurance coverage for shingles vaccine. Labs reviewed with patient. Continue regular dermatology followup.

## 2011-07-29 ENCOUNTER — Other Ambulatory Visit: Payer: Self-pay | Admitting: Family Medicine

## 2011-08-15 ENCOUNTER — Ambulatory Visit (INDEPENDENT_AMBULATORY_CARE_PROVIDER_SITE_OTHER): Payer: PRIVATE HEALTH INSURANCE | Admitting: Family Medicine

## 2011-08-15 ENCOUNTER — Encounter: Payer: Self-pay | Admitting: Family Medicine

## 2011-08-15 VITALS — BP 110/72 | Temp 98.5°F | Wt 170.0 lb

## 2011-08-15 DIAGNOSIS — M76899 Other specified enthesopathies of unspecified lower limb, excluding foot: Secondary | ICD-10-CM

## 2011-08-15 DIAGNOSIS — M7061 Trochanteric bursitis, right hip: Secondary | ICD-10-CM

## 2011-08-15 DIAGNOSIS — M6530 Trigger finger, unspecified finger: Secondary | ICD-10-CM

## 2011-08-15 DIAGNOSIS — M6740 Ganglion, unspecified site: Secondary | ICD-10-CM

## 2011-08-15 NOTE — Patient Instructions (Addendum)
Ganglion A ganglion is a swelling under the skin that is filled with a thick, jelly-like substance. It is a synovial cyst. This is caused by a break (rupture) of the joint lining from the joint space. A ganglion often occurs near an area of repeated minor trauma (damage caused by an accident). Trauma may also be a repetitive movement at work or in a sport. TREATMENT  It often goes away without treatment. It may reappear later. Sometimes a ganglion may need to be surgically removed. Often they are drained and injected with a steroid. Sometimes they respond to:  Rest.   Splinting.  HOME CARE INSTRUCTIONS   Your caregiver will decide the best way of treating your ganglion. Do not try to break the ganglion yourself by pressing on it, poking it with a needle, or hitting it with a heavy object.   Use medications as directed.  SEEK MEDICAL CARE IF:   The ganglion becomes larger or more painful.   You have increased redness or swelling.   You have weakness or numbness in your hand or wrist.  MAKE SURE YOU:   Understand these instructions.   Will watch your condition.   Will get help right away if you are not doing well or get worse.  Document Released: 01/08/2000 Document Revised: 12/30/2010 Document Reviewed: 03/06/2007 [facility] Patient Information 2012 [place], [place]. Trigger Finger Trigger finger (digital tendinitis and stenosing tenosynovitis) is a common disorder that causes an often painful catching of the fingers or thumb. It occurs as a clicking, snapping or locking of a finger in the palm of the hand. The reason for this is that there is a problem with the tendons which flex the fingers sliding smoothly through their sheaths. The cause of this may be inflammation of the tendon and sheath, or from a thickening or nodule in the tendon. The condition may occur in any finger or a couple fingers at the same time. The cause may be overuse while doing the same activity over and over again  with your hands.  Tendons are the tough cords that connect the muscles to bones. Muscles and tendons are part of the system which allows your body to move. When muscles contract in the forearm on the palm side, they pull the tendons toward the elbow and cause the fingers and thumb to bend (flex) toward the palm. These are the flexor tendons. The tendons slide through a slippery smooth membrane (synovium) which is called the tendon sheath. The sheaths have areas of tough fibrous tissues surrounding them which hold the tendons close to the bone. These are called pulleys because they work like a pulley. The first pulley is in the palm of the hand near the crease which runs across your palm. If the area of the tendon thickening is near the pulley, the tendon cannot slide smoothly through the pulley and this causes the trigger finger. The finger may lock with the finger curled or suddenly straighten out with a snap. This is more common in patients with rheumatoid arthritis and diabetes. Left untreated, the condition may get worse to the point where the finger becomes locked in flexion, like making a fist, or less commonly locked with the finger straightened out. DIAGNOSIS  Your caregiver will easily make this diagnosis on examination. TREATMENT   Splinting for 6 to 8 weeks of time may be helpful. Use the splints as your caregiver suggests.   Heat used for twenty minutes at least four times a day followed by ice  packs for twenty minutes unless directed otherwise by your caregiver may be helpful. If you find either heat or cold seems to be making the problem worse, quit using them and ask your caregiver for directions.   Cortisone injections along with splinting may speed up recovery. Several injections may be required. Cortisone may give relief after one injection.   Only take over-the-counter or prescription medicines for pain, discomfort, or fever as directed by your caregiver.   Surgery is another  treatment that may be used if conservative treatments using injection and splinting does not work. Surgery can be minor without incisions (a cut does not have to be made) and can be done with a needle through the skin. No stitches are needed and most patients may return to work the same day.   Other surgical choices involve an open procedure where the surgeon opens the hand through a small incision (cut) and cuts the pulley so the tendon can again slide smoothly. Your hand will still work fine. This small operation requires stitches and the recovery will be a little longer and the incisions will need to be protected until completely healed. You may have to limit your activities for up to 6 months.   Occupational or hand therapy may be required if there is stiffness remaining in the finger.  RISKS AND COMPLICATIONS Complications are uncommon but some problems that may occur are:  Recurrence of the trigger finger. This does not mean that the surgery was not well done. It simply means that you may have formed scar tissue following surgery that causes the problem to reoccur.   Infection which could ruin the results of the surgery and can result in a finger which is frozen and can not move normally.   Nerve injury is possible which could result in permanent numbness of one or more fingers.  CARE AFTER SURGERY  Elevate your hand above your heart and use ice as instructed.   Follow instructions regarding finger motion/exercise.   Keep the surgical wound dry for at least 48 hrs or longer if instructed.   Keep your follow-up appointments.   Return to work and normal activities as instructed.  SEEK IMMEDIATE MEDICAL CARE IF:  Your problems are getting worse or you do not obtain relief from the treatment. Document Released: 10/31/2003 Document Revised: 12/30/2010 Document Reviewed: 06/24/2008 [facility] Patient Information 2012 [place], [place].  Hip Bursitis Bursitis is a swelling and soreness  (inflammation) of a fluid-filled sac (bursa). This sac overlies and protects the joints.  CAUSES   Injury.   Overuse of the muscles surrounding the joint.   Arthritis.   Gout.   Infection.   Cold weather.   Inadequate warm-up and conditioning prior to activities.  The cause may not be known.  SYMPTOMS   Mild to severe irritation.   Tenderness and swelling over the outside of the hip.   Pain with motion of the hip.   If the bursa becomes infected, a fever may be present. Redness, tenderness, and warmth will develop over the hip.  Symptoms usually lessen in 3 to 4 weeks with treatment, but can come back. TREATMENT If conservative treatment does not work, your caregiver may advise draining the bursa and injecting cortisone into the area. This may speed up the healing process. This may also be used as an initial treatment of choice. HOME CARE INSTRUCTIONS   Apply ice to the affected area for 15 to 20 minutes every 3 to 4 hours while awake for the  first 2 days. Put the ice in a plastic bag and place a towel between the bag of ice and your skin.   Rest the painful joint as much as possible, but continue to put the joint through a normal range of motion at least 4 times per day. When the pain lessens, begin normal, slow movements and usual activities to help prevent stiffness of the hip.   Only take over-the-counter or prescription medicines for pain, discomfort, or fever as directed by your caregiver.   Use crutches to limit weight bearing on the hip joint, if advised.   Elevate your painful hip to reduce swelling. Use pillows for propping and cushioning your legs and hips.   Gentle massage may provide comfort and decrease swelling.  SEEK IMMEDIATE MEDICAL CARE IF:   Your pain increases even during treatment, or you are not improving.   You have a fever.   You have heat and inflammation over the involved bursa.   You have any other questions or concerns.  MAKE SURE YOU:     Understand these instructions.   Will watch your condition.   Will get help right away if you are not doing well or get worse.  Document Released: 07/02/2001 Document Revised: 12/30/2010 Document Reviewed: 01/30/2008 [facility] Patient Information 2012 [place], [place].

## 2011-08-15 NOTE — Progress Notes (Signed)
  Subjective:    Patient ID: Dennis Villarreal, male    DOB: 09-05-1950, 61 y.o.   MRN: [id number]  HPI  Patient with several items as follows  Left middle finger frequent trigger issues. Nonpainful. Occasionally he has to manually open. For the most part this is not causing any major issues. Also, he recently noted small minimally painful mobile cystic lesion volar surface left hand fifth digit.  Denies injury.  Right lateral hip pain over the past couple months. Pain with first walking and generally improves after several minutes of walking. Has used Advil with slight relief. No heat or ice. No medial hip pain. No appetite or weight changes. Denies low back pain.  Past Medical History  Diagnosis Date  . DYSLIPIDEMIA 09/04/2009  . ALLERGIC RHINITIS 09/04/2009  . GERD 09/04/2009  . Cancer 2012    melanoma   Past Surgical History  Procedure Date  . Tonsillectomy   . Shoulder surgery 1986, 2009    left shoulder scromoplasty    reports that he quit smoking about 30 years ago. His smoking use included Cigarettes. He has a 20 pack-year smoking history. He does not have any smokeless tobacco history on file. He reports that he drinks alcohol. His drug history not on file. family history includes Heart disease in his mother and Stroke in his mother. Allergies  Allergen Reactions  . Levofloxacin     REACTION: hives and GI upset      Review of Systems  Constitutional: Negative for fever, chills, appetite change and unexpected weight change.  Genitourinary: Negative for dysuria.  Neurological: Negative for weakness and numbness.       Objective:   Physical Exam  Constitutional: He appears well-developed and well-nourished.  Cardiovascular: Normal rate and regular rhythm.   Musculoskeletal:       Left hand reveals small mobile approximately 2 mm cystic lesion ventral surface base of the fifth phalanx. Minimally tender to palpation. Full range of motion fifth digit  Left middle finger  reveals full fluid range of motion. No tender nodules noted  Right hip reveals tenderness greater trochanteric bursa region. Full range of motion right hip with internal and external rotation. Full-strength.          Assessment & Plan:  #1 benign ganglion cyst left hand. Reassurance given #2 trigger finger left middle finger. Minimally bothersome at this time. Observation. We could not appreciate or localize any tender nodule to inject but if he has ongoing issues consider orthopedic referral #3 right greater trochanteric bursitis. 2 try icing initially and over-the-counter anti-inflammatories as needed. Consider corticosteroid injection if not improving

## 2011-09-04 ENCOUNTER — Other Ambulatory Visit: Payer: Self-pay | Admitting: Family Medicine

## 2011-12-14 ENCOUNTER — Other Ambulatory Visit: Payer: Self-pay | Admitting: Family Medicine

## 2012-01-25 DIAGNOSIS — J189 Pneumonia, unspecified organism: Secondary | ICD-10-CM

## 2012-01-25 HISTORY — DX: Pneumonia, unspecified organism: J18.9

## 2012-09-04 ENCOUNTER — Other Ambulatory Visit (INDEPENDENT_AMBULATORY_CARE_PROVIDER_SITE_OTHER): Payer: 59

## 2012-09-04 DIAGNOSIS — Z0000 Encounter for general adult medical examination without abnormal findings: Secondary | ICD-10-CM

## 2012-09-04 LAB — LIPID PANEL
HDL: 37.4 mg/dL — ABNORMAL LOW (ref >39.00)
LDL Cholesterol: 103 mg/dL — ABNORMAL HIGH (ref 0–99)
Total CHOL/HDL Ratio: 4
Triglycerides: 125 mg/dL (ref 0.0–149.0)

## 2012-09-04 LAB — CBC WITH DIFFERENTIAL/PLATELET
Eosinophils Relative: 2.4 % (ref 0.0–5.0)
Monocytes Relative: 6.5 % (ref 3.0–12.0)
Neutrophils Relative %: 59.7 % (ref 43.0–77.0)
Platelets: 162 10*3/uL (ref 150.0–400.0)
RBC: 4.77 Mil/uL (ref 4.22–5.81)
WBC: 5.9 10*3/uL (ref 4.5–10.5)

## 2012-09-04 LAB — BASIC METABOLIC PANEL
CO2: 28 mEq/L (ref 19–32)
Calcium: 9.3 mg/dL (ref 8.4–10.5)
Chloride: 105 mEq/L (ref 96–112)
Sodium: 140 mEq/L (ref 135–145)

## 2012-09-04 LAB — HEPATIC FUNCTION PANEL
ALT: 17 U/L (ref 0–53)
AST: 19 U/L (ref 0–37)
Alkaline Phosphatase: 55 U/L (ref 39–117)
Bilirubin, Direct: 0.2 mg/dL (ref 0.0–0.3)
Total Bilirubin: 1.2 mg/dL (ref 0.3–1.2)

## 2012-09-04 LAB — POCT URINALYSIS DIPSTICK
Bilirubin, UA: NEGATIVE
Blood, UA: NEGATIVE
Glucose, UA: NEGATIVE
Ketones, UA: NEGATIVE
Nitrite, UA: NEGATIVE
Spec Grav, UA: 1.02

## 2012-09-13 ENCOUNTER — Encounter: Payer: Self-pay | Admitting: Family Medicine

## 2012-09-13 ENCOUNTER — Ambulatory Visit (INDEPENDENT_AMBULATORY_CARE_PROVIDER_SITE_OTHER): Payer: 59 | Admitting: Family Medicine

## 2012-09-13 VITALS — BP 128/78 | HR 66 | Temp 98.8°F | Ht 66.0 in | Wt 169.0 lb

## 2012-09-13 DIAGNOSIS — F329 Major depressive disorder, single episode, unspecified: Secondary | ICD-10-CM | POA: Insufficient documentation

## 2012-09-13 DIAGNOSIS — F341 Dysthymic disorder: Secondary | ICD-10-CM

## 2012-09-13 DIAGNOSIS — Z0000 Encounter for general adult medical examination without abnormal findings: Secondary | ICD-10-CM

## 2012-09-13 DIAGNOSIS — F32A Depression, unspecified: Secondary | ICD-10-CM

## 2012-09-13 MED ORDER — SERTRALINE HCL 50 MG PO TABS: 50.0000 mg | ORAL_TABLET | Freq: Every day | ORAL | Status: DC

## 2012-09-13 NOTE — Progress Notes (Signed)
Subjective:    Patient ID: Dennis Villarreal, male    DOB: 1950-07-31, 62 y.o.   MRN: [id number]  HPI Patient here for complete physical. Chronic problems include history of hyperlipidemia, GERD, and history of melanoma. Remains on Lipitor for hyperlipidemia. Protonix 40 mg daily and reflux is been well controlled. He continues to see dermatologist yearly for skin cancer screening. Not aware of any new skin lesions.  Since last visit, he has taken on new job. Generally working well but had tremendous job stress. Poor sleep quality. Some depressed mood. No history of panic disorder. Frequently has situational stressors and he is extremely stressed at work.  Past Medical History  Diagnosis Date  . DYSLIPIDEMIA 09/04/2009  . ALLERGIC RHINITIS 09/04/2009  . GERD 09/04/2009  . Cancer 2012    melanoma   Past Surgical History  Procedure Laterality Date  . Tonsillectomy    . Shoulder surgery  1986, 2009    left shoulder scromoplasty    reports that he quit smoking about 31 years ago. His smoking use included Cigarettes. He has a 20 pack-year smoking history. He does not have any smokeless tobacco history on file. He reports that  drinks alcohol. His drug history is not on file. family history includes Heart disease in his mother; Stroke in his mother. Allergies  Allergen Reactions  . Levofloxacin     REACTION: hives and GI upset     Review of Systems  Constitutional: Negative for fever, activity change, appetite change, fatigue and unexpected weight change.  HENT: Negative for ear pain, congestion and trouble swallowing.   Eyes: Negative for pain and visual disturbance.  Respiratory: Negative for cough, shortness of breath and wheezing.   Cardiovascular: Negative for chest pain and palpitations.  Gastrointestinal: Negative for nausea, vomiting, abdominal pain, diarrhea, constipation, blood in stool, abdominal distention and rectal pain.  Genitourinary: Negative for dysuria, hematuria and  testicular pain.  Musculoskeletal: Negative for joint swelling and arthralgias.  Skin: Negative for rash.  Neurological: Negative for dizziness, syncope and headaches.  Hematological: Negative for adenopathy.  Psychiatric/Behavioral: Positive for sleep disturbance and dysphoric mood. Negative for suicidal ideas and confusion. The patient is nervous/anxious.        Objective:   Physical Exam  Constitutional: He is oriented to person, place, and time. He appears well-developed and well-nourished. No distress.  HENT:  Head: Normocephalic and atraumatic.  Right Ear: External ear normal.  Left Ear: External ear normal.  Mouth/Throat: Oropharynx is clear and moist.  Eyes: Conjunctivae and EOM are normal. Pupils are equal, round, and reactive to light.  Neck: Normal range of motion. Neck supple. No thyromegaly present.  Cardiovascular: Normal rate, regular rhythm and normal heart sounds.   No murmur heard. Pulmonary/Chest: No respiratory distress. He has no wheezes. He has no rales.  Abdominal: Soft. Bowel sounds are normal. He exhibits no distension and no mass. There is no tenderness. There is no rebound and no guarding.  Musculoskeletal: He exhibits no edema.  Lymphadenopathy:    He has no cervical adenopathy.  Neurological: He is alert and oriented to person, place, and time. He displays normal reflexes. No cranial nerve deficit.  Skin: No rash noted.  Psychiatric: He has a normal mood and affect.          Assessment & Plan:  Complete physical. Lab work reviewed with no major abnormalities. Colonoscopy up to date. He thinks tetanus within the past 10 years. We discussed regular exercise. Discussed options regarding his stress and  anxiety issues. He has some mild depression. We agreed to trial of sertraline 50 mg once daily and reassess one month. We reviewed possible side effects. Discussed nonpharmacologic ways of dealing with stress and anxiety He is aware of importance of sun  protection with history of melanoma

## 2012-09-17 ENCOUNTER — Other Ambulatory Visit: Payer: Self-pay | Admitting: Family Medicine

## 2013-01-04 ENCOUNTER — Telehealth: Payer: Self-pay | Admitting: Family Medicine

## 2013-01-04 NOTE — Telephone Encounter (Signed)
Noted  

## 2013-01-04 NOTE — Telephone Encounter (Signed)
Patient Information:  Caller Name: [place]  Phone: [phone]  Patient: Dennis Villarreal, Dennis Villarreal  Gender: Male  DOB: 1950-12-29  Age: 62 Years  PCP: [name] (Family Practice)  Office Follow Up:  Does the office need to follow up with this patient?: No  Instructions For The Office: N/A  RN Note:  Pt is requesting an appt for Monday; office appt for Saturday at [facility] but pt declined  Symptoms  Reason For Call & Symptoms: Pt is calling and states that he is having dizziness; sx started "a few months" ago  dizziness is on and off; but when it does come on it can be severe; also having eye pressure with the dizziness;  dizziness and vision impairment last a "couple" minutes; last episode of dizziness was on 01/03/13; no dixzziness today;  dizziness occurs daily  Reviewed Health History In EMR: Yes  Reviewed Medications In EMR: Yes  Reviewed Allergies In EMR: Yes  Reviewed Surgeries / Procedures: Yes  Date of Onset of Symptoms: Unknown  Guideline(s) Used:  Dizziness  Disposition Per Guideline:   See Within 2 Weeks in Office  Reason For Disposition Reached:   Dizziness not present now, but is a chronic symptom (recurrent or ongoing AND lasting > 4 weeks)  Advice Given:  Some Causes of Temporary Dizziness:  Poor Fluid Intake - Not drinking enough fluids and being a little dehydrated is a common cause of temporary dizziness. This is always worse during hot weather.  Standing Up Suddenly - Standing up suddenly (especially getting out of bed) or prolonged standing in one place are common causes of temporary dizziness. Not drinking enough fluids always makes it worse. Certain medications can cause or increase this type of dizziness (e.g., blood pressure medications).  Heat Exposure - Hot weather, hot tubs, or too much sun exposure are common causes of temporary dizziness. Not drinking enough fluids always makes it worse.  Drink Fluids:  Drink several glasses of fruit juice, other clear  fluids, or water. This will improve hydration and blood glucose. If you have a fever or have had heat exposure, make sure the fluids are cold.  Rest for 1-2 Hours:  Lie down with feet elevated for 1 hour. This will improve blood flow and increase blood flow to the brain.  Stand Up Slowly:  In the mornings, sit up for a few minutes before you stand up. That will help your blood flow make the adjustment.  Sit down or lie down if you feel dizzy.  Call Back If:  Still feel dizzy after 2 hours of rest and fluids  Passes out (faints)  You become worse.  Patient Will Follow Care Advice:  YES  Appointment Scheduled:  01/07/2013 16:30:00 Appointment Scheduled Provider:  [name] (Family Practice) Scheduled per office staff

## 2013-01-07 ENCOUNTER — Encounter: Payer: Self-pay | Admitting: Family Medicine

## 2013-01-07 ENCOUNTER — Ambulatory Visit (INDEPENDENT_AMBULATORY_CARE_PROVIDER_SITE_OTHER): Payer: 59 | Admitting: Family Medicine

## 2013-01-07 VITALS — BP 124/74 | HR 60 | Temp 98.2°F | Wt 178.0 lb

## 2013-01-07 DIAGNOSIS — R42 Dizziness and giddiness: Secondary | ICD-10-CM

## 2013-01-07 NOTE — Patient Instructions (Signed)
Vertigo Vertigo means you feel like you or your surroundings are moving when they are not. Vertigo can be dangerous if it occurs when you are at work, driving, or performing difficult activities.  CAUSES  Vertigo occurs when there is a conflict of signals sent to your brain from the visual and sensory systems in your body. There are many different causes of vertigo, including:  Infections, especially in the inner ear.  A bad reaction to a drug or misuse of alcohol and medicines.  Withdrawal from drugs or alcohol.  Rapidly changing positions, such as lying down or rolling over in bed.  A migraine headache.  Decreased blood flow to the brain.  Increased pressure in the brain from a head injury, infection, tumor, or bleeding. SYMPTOMS  You may feel as though the world is spinning around or you are falling to the ground. Because your balance is upset, vertigo can cause nausea and vomiting. You may have involuntary eye movements (nystagmus). DIAGNOSIS  Vertigo is usually diagnosed by physical exam. If the cause of your vertigo is unknown, your caregiver may perform imaging tests, such as an MRI scan (magnetic resonance imaging). TREATMENT  Most cases of vertigo resolve on their own, without treatment. Depending on the cause, your caregiver may prescribe certain medicines. If your vertigo is related to body position issues, your caregiver may recommend movements or procedures to correct the problem. In rare cases, if your vertigo is caused by certain inner ear problems, you may need surgery. HOME CARE INSTRUCTIONS   Follow your caregiver's instructions.  Avoid driving.  Avoid operating heavy machinery.  Avoid performing any tasks that would be dangerous to you or others during a vertigo episode.  Tell your caregiver if you notice that certain medicines seem to be causing your vertigo. Some of the medicines used to treat vertigo episodes can actually make them worse in some people. SEEK  IMMEDIATE MEDICAL CARE IF:   Your medicines do not relieve your vertigo or are making it worse.  You develop problems with talking, walking, weakness, or using your arms, hands, or legs.  You develop severe headaches.  Your nausea or vomiting continues or gets worse.  You develop visual changes.  A family member notices behavioral changes.  Your condition gets worse. MAKE SURE YOU:  Understand these instructions.  Will watch your condition.  Will get help right away if you are not doing well or get worse. Document Released: 10/20/2004 Document Revised: 04/04/2011 Document Reviewed: 07/29/2010 [employer] Patient Information 2014 [employer].  

## 2013-01-07 NOTE — Progress Notes (Signed)
Pre visit review using our clinic review tool, if applicable. No additional management support is needed unless otherwise documented below in the visit note. 

## 2013-01-07 NOTE — Progress Notes (Signed)
   Subjective:    Patient ID: Dennis Villarreal, male    DOB: [birth year], 62 y.o.   MRN: [id number]  HPI Patient here as a work in today with 2 month history of dizziness. On further questioning, this sounds more like vertigo and disequilibrium. He denies any orthostatic symptoms or syncope. He has some chronic bilateral hearing loss and has been seen by ENT physician previously. He has some chronic tinnitus and chronic bilateral hearing loss (?sensorineural) left > right.  Recently saw audiology. They've not recommended hearing aid yet. Relates 2 month history of symptoms which are also daily. Worse with head movement. Rare nausea. No vomiting. He denies any focal weakness, speech changes, headaches, ataxia, fever, chills, appetite or weight changes. No alleviating factors.  Past Medical History  Diagnosis Date  . DYSLIPIDEMIA 09/04/2009  . ALLERGIC RHINITIS 09/04/2009  . GERD 09/04/2009  . Cancer 2012    melanoma   Past Surgical History  Procedure Laterality Date  . Tonsillectomy    . Shoulder surgery  1986, 2009    left shoulder scromoplasty    reports that he quit smoking about 31 years ago. His smoking use included Cigarettes. He has a 20 pack-year smoking history. He does not have any smokeless tobacco history on file. He reports that he drinks alcohol. His drug history is not on file. family history includes Heart disease in his mother; Stroke in his mother. Allergies  Allergen Reactions  . Levofloxacin     REACTION: hives and GI upset      Review of Systems  Constitutional: Negative for appetite change and unexpected weight change.  HENT: Positive for hearing loss. Negative for congestion, ear pain and trouble swallowing.   Respiratory: Negative for cough and shortness of breath.   Cardiovascular: Negative for chest pain.  Neurological: Positive for dizziness. Negative for weakness and headaches.       Objective:   Physical Exam  Constitutional: He is oriented to  person, place, and time. He appears well-developed and well-nourished.  HENT:  Right Ear: External ear normal.  Left Ear: External ear normal.  Mouth/Throat: Oropharynx is clear and moist.  Neck: Neck supple. No thyromegaly present.  No carotid bruits  Cardiovascular: Normal rate.   Pulmonary/Chest: Effort normal and breath sounds normal. No respiratory distress. He has no wheezes. He has no rales.  Neurological: He is alert and oriented to person, place, and time. He has normal reflexes. No cranial nerve deficit. Coordination normal.  Weber lateralizes to right ear.   Rinne-air conduction > bone conduction bilateral.  Psychiatric: He has a normal mood and affect. His behavior is normal.          Assessment & Plan:  Patient has history of relatively constant vertigo/disequilibrium symptoms over 2 months duration.  ? atypical for benign positional vertigo with this duration.   Nonfocal neurologic exam.  May be candidate for vestibular rehab.  I would like to get neurology consult first.

## 2013-01-31 ENCOUNTER — Telehealth: Payer: Self-pay | Admitting: Family Medicine

## 2013-01-31 ENCOUNTER — Ambulatory Visit (INDEPENDENT_AMBULATORY_CARE_PROVIDER_SITE_OTHER): Payer: 59 | Admitting: Family Medicine

## 2013-01-31 ENCOUNTER — Encounter: Payer: Self-pay | Admitting: Family Medicine

## 2013-01-31 VITALS — BP 132/82 | HR 61 | Temp 97.6°F | Ht 66.0 in | Wt 174.0 lb

## 2013-01-31 DIAGNOSIS — J329 Chronic sinusitis, unspecified: Secondary | ICD-10-CM

## 2013-01-31 DIAGNOSIS — K219 Gastro-esophageal reflux disease without esophagitis: Secondary | ICD-10-CM

## 2013-01-31 MED ORDER — CEFDINIR 300 MG PO CAPS: 300.0000 mg | ORAL_CAPSULE | Freq: Two times a day (BID) | ORAL | Status: AC

## 2013-01-31 MED ORDER — HYDROCODONE-HOMATROPINE 5-1.5 MG/5ML PO SYRP: 5.0000 mL | ORAL_SOLUTION | Freq: Every evening | ORAL | Status: DC | PRN

## 2013-01-31 NOTE — Progress Notes (Signed)
Pre visit review using our clinic review tool, if applicable. No additional management support is needed unless otherwise documented below in the visit note. 

## 2013-01-31 NOTE — Progress Notes (Signed)
Dennis Villarreal [id number] 01-Jul-1950 02/03/2013      Progress Note-Follow Up  Subjective  Chief Complaint  Chief Complaint  Patient presents with  . Cough    w/ blood tinged sputum    HPI  Patient is a 63 year old male who is in with complaints Cough 11 days with cold. Onset last Sunday tightening in throat.  Head cold, sinus pressure, headache in back, dull and nagging pains, stuffed nose  missed a couple days of work.  Started to feel better until 2 days ago.  Bright red blood coughing up x 2 days.   Tylenol cold and flu for cough Recent travel to [facility] by car,  No palpitations, chest pain,  SOB Denies nausea, vomiting, diarrhea, constipation. No fever, night sweats, cold chills.  Bright red blood, mostly in the morning No pain in throat or mouth     Past Medical History  Diagnosis Date  . DYSLIPIDEMIA 09/04/2009  . ALLERGIC RHINITIS 09/04/2009  . GERD 09/04/2009  . Cancer 2012    melanoma    Past Surgical History  Procedure Laterality Date  . Tonsillectomy    . Shoulder surgery  1986, 2009    left shoulder scromoplasty    Family History  Problem Relation Age of Onset  . Heart disease Mother     CVA and cardiac arrhythmia, ? atrial fib  . Stroke Mother     History   Social History  . Marital Status: Married    Spouse Name: N/A    Number of Children: N/A  . Years of Education: N/A   Occupational History  . Not on file.   Social History Main Topics  . Smoking status: Former Smoker -- 1.00 packs/day for 20 years    Types: Cigarettes    Quit date: 04/20/1981  . Smokeless tobacco: Not on file  . Alcohol Use: Yes  . Drug Use: Not on file  . Sexual Activity: Not on file   Other Topics Concern  . Not on file   Social History Narrative  . No narrative on file    Current Outpatient Prescriptions on File Prior to Visit  Medication Sig Dispense Refill  . atorvastatin (LIPITOR) 20 MG tablet TAKE 1 TABLET BY MOUTH EVERY DAY  30 tablet  6  .  Linaclotide (LINZESS) 145 MCG CAPS capsule Take 145 mcg by mouth daily.      . multivitamin (ONE-A-DAY MEN'S) TABS Take 1 tablet by mouth daily.        . pantoprazole (PROTONIX) 40 MG tablet TAKE 1 TABLET BY MOUTH EVERY DAY  90 tablet  3   No current facility-administered medications on file prior to visit.    Allergies  Allergen Reactions  . Levofloxacin     REACTION: hives and GI upset    Review of Systems  Review of Systems  Constitutional: Positive for malaise/fatigue. Negative for fever.  HENT: Positive for congestion.   Eyes: Negative for discharge.  Respiratory: Positive for cough and sputum production. Negative for shortness of breath.   Cardiovascular: Negative for chest pain, palpitations and leg swelling.  Gastrointestinal: Negative for nausea, abdominal pain and diarrhea.  Genitourinary: Negative for dysuria.  Musculoskeletal: Positive for myalgias. Negative for falls.  Skin: Negative for rash.  Neurological: Negative for loss of consciousness and headaches.  Endo/Heme/Allergies: Negative for polydipsia.  Psychiatric/Behavioral: Negative for depression and suicidal ideas. The patient is not nervous/anxious and does not have insomnia.     Objective  BP 132/82  Pulse 61  Temp(Src) 97.6 F (36.4 C) (Oral)  Ht 5\' 6"  (1.676 m)  Wt 174 lb (78.926 kg)  BMI 28.10 kg/m2  SpO2 97%  Physical Exam Physical Exam  Constitutional: He is oriented to person, place, and time and well-developed, well-nourished, and in no distress. No distress.  HENT:  Head: Normocephalic and atraumatic.  Eyes: Conjunctivae are normal.  Neck: Neck supple. No thyromegaly present.  Cardiovascular: Normal rate, regular rhythm and normal heart sounds.   No murmur heard. Pulmonary/Chest: Effort normal and breath sounds normal. No respiratory distress.  Abdominal: He exhibits no distension and no mass. There is no tenderness.  Musculoskeletal: He exhibits no edema.  Neurological: He is alert  and oriented to person, place, and time.  Skin: Skin is warm.  Psychiatric: Memory, affect and judgment normal.    Lab Results  Component Value Date   TSH 1.19 09/04/2012   Lab Results  Component Value Date   WBC 5.9 09/04/2012   HGB 15.2 09/04/2012   HCT 45.3 09/04/2012   MCV 94.8 09/04/2012   PLT 162.0 09/04/2012   Lab Results  Component Value Date   CREATININE 0.8 09/04/2012   BUN 13 09/04/2012   NA 140 09/04/2012   K 4.9 09/04/2012   CL 105 09/04/2012   CO2 28 09/04/2012   Lab Results  Component Value Date   ALT 17 09/04/2012   AST 19 09/04/2012   ALKPHOS 55 09/04/2012   BILITOT 1.2 09/04/2012   Lab Results  Component Value Date   CHOL 165 09/04/2012   Lab Results  Component Value Date   HDL 37.40* 09/04/2012   Lab Results  Component Value Date   LDLCALC 103* 09/04/2012   Lab Results  Component Value Date   TRIG 125.0 09/04/2012   Lab Results  Component Value Date   CHOLHDL 4 09/04/2012     Assessment & Plan  Sinusitis Started on cefdinir and probiotics, given Hycodan for cough and encouraged increased rest and hydration  GERD Well controlled on current meds most days

## 2013-01-31 NOTE — Patient Instructions (Signed)
Mucinex 600 mg po bid x 10 days Probiotic Digestive Advantage, Florastor, Culturelle, Align Calcium, magnesium Zinc tab daily hydrate  Sinusitis Sinusitis is redness, soreness, and swelling (inflammation) of the paranasal sinuses. Paranasal sinuses are air pockets within the bones of your face (beneath the eyes, the middle of the forehead, or above the eyes). In healthy paranasal sinuses, mucus is able to drain out, and air is able to circulate through them by way of your nose. However, when your paranasal sinuses are inflamed, mucus and air can become trapped. This can allow bacteria and other germs to grow and cause infection. Sinusitis can develop quickly and last only a short time (acute) or continue over a long period (chronic). Sinusitis that lasts for more than 12 weeks is considered chronic.  CAUSES  Causes of sinusitis include:  Allergies.  Structural abnormalities, such as displacement of the cartilage that separates your nostrils (deviated septum), which can decrease the air flow through your nose and sinuses and affect sinus drainage.  Functional abnormalities, such as when the small hairs (cilia) that line your sinuses and help remove mucus do not work properly or are not present. SYMPTOMS  Symptoms of acute and chronic sinusitis are the same. The primary symptoms are pain and pressure around the affected sinuses. Other symptoms include:  Upper toothache.  Earache.  Headache.  Bad breath.  Decreased sense of smell and taste.  A cough, which worsens when you are lying flat.  Fatigue.  Fever.  Thick drainage from your nose, which often is green and may contain pus (purulent).  Swelling and warmth over the affected sinuses. DIAGNOSIS  Your caregiver will perform a physical exam. During the exam, your caregiver may:  Look in your nose for signs of abnormal growths in your nostrils (nasal polyps).  Tap over the affected sinus to check for signs of infection.  View  the inside of your sinuses (endoscopy) with a special imaging device with a light attached (endoscope), which is inserted into your sinuses. If your caregiver suspects that you have chronic sinusitis, one or more of the following tests may be recommended:  Allergy tests.  Nasal culture A sample of mucus is taken from your nose and sent to a lab and screened for bacteria.  Nasal cytology A sample of mucus is taken from your nose and examined by your caregiver to determine if your sinusitis is related to an allergy. TREATMENT  Most cases of acute sinusitis are related to a viral infection and will resolve on their own within 10 days. Sometimes medicines are prescribed to help relieve symptoms (pain medicine, decongestants, nasal steroid sprays, or saline sprays).  However, for sinusitis related to a bacterial infection, your caregiver will prescribe antibiotic medicines. These are medicines that will help kill the bacteria causing the infection.  Rarely, sinusitis is caused by a fungal infection. In theses cases, your caregiver will prescribe antifungal medicine. For some cases of chronic sinusitis, surgery is needed. Generally, these are cases in which sinusitis recurs more than 3 times per year, despite other treatments. HOME CARE INSTRUCTIONS   Drink plenty of water. Water helps thin the mucus so your sinuses can drain more easily.  Use a humidifier.  Inhale steam 3 to 4 times a day (for example, sit in the bathroom with the shower running).  Apply a warm, moist washcloth to your face 3 to 4 times a day, or as directed by your caregiver.  Use saline nasal sprays to help moisten and clean your  sinuses.  Take over-the-counter or prescription medicines for pain, discomfort, or fever only as directed by your caregiver. SEEK IMMEDIATE MEDICAL CARE IF:  You have increasing pain or severe headaches.  You have nausea, vomiting, or drowsiness.  You have swelling around your face.  You have  vision problems.  You have a stiff neck.  You have difficulty breathing. MAKE SURE YOU:   Understand these instructions.  Will watch your condition.  Will get help right away if you are not doing well or get worse. Document Released: 01/10/2005 Document Revised: 04/04/2011 Document Reviewed: 01/25/2011 [facility] Patient Information 2014 [place], [place].

## 2013-01-31 NOTE — Telephone Encounter (Signed)
Patient Information:  Caller Name: Ed[place]  Phone: [phone]  Patient: Casmer, Yepiz  Gender: Male  DOB: 1950/10/03  Age: 63 Years  PCP: Carolann Littler [facility])  Office Follow Up:  Does the office need to follow up with this patient?: No  Instructions For The Office: N/A  RN Note:  Tajh states he has had cold symptoms since 01/20/13.  Cough is worsening and now producing blood tinged sputum along with blood tinged nasal discharge at times.  Afebrile.  Denies difficulty breathing or wheezing.  No appts available at [facility] location.  Scheduled appt for today at 1445 with Dr. Charlett Blake [facility] Location).  Symptoms  Reason For Call & Symptoms: Cough and nasal congestion  Reviewed Health History In EMR: Yes  Reviewed Medications In EMR: Yes  Reviewed Allergies In EMR: Yes  Reviewed Surgeries / Procedures: Yes  Date of Onset of Symptoms: 01/20/2013  Treatments Tried: Tylenol Cold and Flu  Treatments Tried Worked: Yes  Guideline(s) Used:  Cough  Disposition Per Guideline:   See Today in Office  Reason For Disposition Reached:   Coughing up rusty-colored (reddish-brown) or blood-tinged sputum  Advice Given:  Call Back If:  You become worse.  Patient Will Follow Care Advice:  YES

## 2013-02-03 ENCOUNTER — Encounter: Payer: Self-pay | Admitting: Family Medicine

## 2013-02-03 DIAGNOSIS — J329 Chronic sinusitis, unspecified: Secondary | ICD-10-CM | POA: Insufficient documentation

## 2013-02-03 NOTE — Assessment & Plan Note (Signed)
Well controlled on current meds most days

## 2013-02-03 NOTE — Assessment & Plan Note (Signed)
Started on cefdinir and probiotics, given Hycodan for cough and encouraged increased rest and hydration

## 2013-02-07 ENCOUNTER — Ambulatory Visit (INDEPENDENT_AMBULATORY_CARE_PROVIDER_SITE_OTHER): Payer: 59 | Admitting: Neurology

## 2013-02-07 ENCOUNTER — Encounter: Payer: Self-pay | Admitting: Neurology

## 2013-02-07 VITALS — BP 132/76 | HR 64 | Temp 98.0°F | Ht 66.0 in | Wt 172.0 lb

## 2013-02-07 DIAGNOSIS — R42 Dizziness and giddiness: Secondary | ICD-10-CM

## 2013-02-07 DIAGNOSIS — H9319 Tinnitus, unspecified ear: Secondary | ICD-10-CM

## 2013-02-07 DIAGNOSIS — H93A9 Pulsatile tinnitus, unspecified ear: Secondary | ICD-10-CM

## 2013-02-07 DIAGNOSIS — G44209 Tension-type headache, unspecified, not intractable: Secondary | ICD-10-CM

## 2013-02-07 NOTE — Progress Notes (Addendum)
NEUROLOGY CONSULTATION NOTE  Dennis Villarreal MRN: [id number] DOB: 12/09/1950  Referring provider: Dr. Elease Hashimoto Primary care provider: Dr. Elease Hashimoto  Reason for consult:  Dizziness.  HISTORY OF PRESENT ILLNESS: Dennis Villarreal is a 63 year old right-handed man with history of dyslipidemia, GERD, melanoma and allergic rhinitis who presents for dizziness.  Records and images were personally reviewed where available.    He began experiencing dizziness approximately 6 months ago.  He describes it as a sense of movement.  It usually occurs with change in head movement or just getting up but on occasion it can occur while walking.  It lasts about 30 seconds.  He stumbles a bit when it occurs but he has never fell.  There is no associated nausea or vomiting.  Unchanged over the past 6 months.    About 4 months ago, he began experiencing a pulsating sound and sensation in his head.  The pulsating sensation is felt behind his eyes and he hears a wooshing type sound in his head.  It is intermittent and states that he can trigger it when he turns his eyes to the right.  He has a history of dull headaches, but has been more frequent lately.  It can occur anywhere on the head, but often left-sided.  It is non-throbbing.  It is not associated with nausea, vomiting, photophobia or phonophobia.  It occurs about 3 times a week and usually only lasts 30 minutes.  He does not take medication for it.  Over the past few months, he has had brief changes in vision, in which he "loses focus".  It usually occurs when he is reading or looking at something up close.  He says his eyes become crossed and sees double vision (horizontal) or blurred vision.  He has seen an optometrist and his eyes are fine.  There is no eye drooping and it can occur anytime of day.    Over the past 6 weeks, he has had about 6 episodes of left arm fatigue.  It occurs spontaneously, not any particular time of day, and not with activity.   It is a sensation that he had performed a long day of manual labor with his arm.  No shooting pain or numbness.  Just a dull aching and fatigue.  It is very brief, lasting only up to one minute.  He denies fever, neck pain, facial weakness, facial numbness or focal weakness of the extremities.  He denies swallowing or breathing difficulties.  He has history of bilateral hearing loss and chronic tinnitus, progressed over the past several years.  He went to see the audiologist, who wanted to hold off on hearing aids until his symptoms are under better control.  Recently, he has had an upper respiratory tract infection.  Over the past couple of weeks, he developed cough, head cold, sinus pressure, nasal congestion.  He also was coughing up bright red blood.  Last week, he was started on cefdinir, priobiotics and hycodan.  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  PAST MEDICAL HISTORY: Past Medical History  Diagnosis Date  . DYSLIPIDEMIA 09/04/2009  . ALLERGIC RHINITIS 09/04/2009  . GERD 09/04/2009  . Cancer 2012    melanoma    PAST SURGICAL HISTORY: Past Surgical History  Procedure Laterality Date  . Tonsillectomy    . Shoulder surgery  1986, 2009    left shoulder scromoplasty    MEDICATIONS: Current  Outpatient Prescriptions on File Prior to Visit  Medication Sig Dispense Refill  . atorvastatin (LIPITOR) 20 MG tablet TAKE 1 TABLET BY MOUTH EVERY DAY  30 tablet  6  . cefdinir (OMNICEF) 300 MG capsule Take 1 capsule (300 mg total) by mouth 2 (two) times daily.  20 capsule  0  . HYDROcodone-homatropine (HYCODAN) 5-1.5 MG/5ML syrup Take 5 mLs by mouth at bedtime as needed for cough.  120 mL  0  . Linaclotide (LINZESS) 145 MCG CAPS capsule Take 145 mcg by mouth daily.      . multivitamin (ONE-A-DAY MEN'S) TABS Take 1 tablet by mouth daily.        . pantoprazole (PROTONIX) 40 MG tablet TAKE 1 TABLET BY MOUTH EVERY DAY  90  tablet  3   No current facility-administered medications on file prior to visit.    ALLERGIES: Allergies  Allergen Reactions  . Levofloxacin     REACTION: hives and GI upset  . Other Hives    levaquin    FAMILY HISTORY: Family History  Problem Relation Age of Onset  . Heart disease Mother     CVA and cardiac arrhythmia, ? atrial fib  . Stroke Mother     SOCIAL HISTORY: History   Social History  . Marital Status: Married    Spouse Name: N/A    Number of Children: N/A  . Years of Education: N/A   Occupational History  . Not on file.   Social History Main Topics  . Smoking status: Former Smoker -- 1.00 packs/day for 20 years    Types: Cigarettes    Quit date: 04/20/1981  . Smokeless tobacco: Not on file  . Alcohol Use: Yes  . Drug Use: Not on file  . Sexual Activity: Not on file   Other Topics Concern  . Not on file   Social History Narrative  . No narrative on file    REVIEW OF SYSTEMS: Constitutional: No fevers, chills, or sweats, no generalized fatigue, change in appetite Eyes: No visual changes, double vision, eye pain Ear, nose and throat: Hearing loss, nasal congestion Cardiovascular: No chest pain, palpitations Respiratory:  No shortness of breath at rest or with exertion, wheezes GastrointestinaI: No nausea, vomiting, diarrhea, abdominal pain, fecal incontinence Genitourinary:  No dysuria, urinary retention or frequency Musculoskeletal:  No neck pain, back pain Integumentary: No rash, pruritus, skin lesions Neurological: as above Psychiatric: No depression, insomnia, anxiety Endocrine: No palpitations, fatigue, diaphoresis, mood swings, change in appetite, change in weight, increased thirst Hematologic/Lymphatic:  No anemia, purpura, petechiae. Allergic/Immunologic: no itchy/runny eyes, nasal congestion, recent allergic reactions, rashes  PHYSICAL EXAM: Filed Vitals:   02/07/13 0754  BP: 132/76  Pulse: 64  Temp: 98 F (36.7 C)   General:  No acute distress Head:  Normocephalic/atraumatic Neck: supple, no paraspinal tenderness, full range of motion Back: No paraspinal tenderness Heart: regular rate and rhythm Lungs: Clear to auscultation bilaterally. Vascular: No carotid bruits.  No orbital bruits. Neurological Exam: Mental status: alert and oriented to person, place, and time, speech fluent and not dysarthric, language intact. Cranial nerves: CN I: not tested CN II: pupils equal, round and reactive to light, visual fields intact, fundi unremarkable. CN III, IV, VI:  full range of motion, no nystagmus, no ptosis CN V: facial sensation intact CN VII: upper and lower face symmetric CN VIII: reduced hearing bilaterally CN IX, X: gag intact, uvula midline CN XI: sternocleidomastoid and trapezius muscles intact CN XII: tongue midline Bulk & Tone: normal, no  fasciculations. Motor: 5/5 throughout Sensation: pinprick and vibration intact Deep Tendon Reflexes: 2+ throughout, toes down Finger to nose testing: normal Heel to shin: normal Gait: normal stance and stride.  Able to walk in tandem. Romberg negative. Dix-Hallpike negative.  IMPRESSION: 1.  Vertigo, more likely peripheral vestibulopathy 2.  Pulsatile tinnitus 3.  Tension-type headaches. 4.  Brief diplopia  5.  Subjective brief episodic left arm fatigue (not sure what to make of this.  Unusual presentation.  Exam is normal.)  PLAN: Since this is new and persistent for several months, we should rule out intracranial abnormality.  Also we will check intracranial and extracranial vasculature to look for structural cause of pulsatile tinnitus.  If unremarkable, then may benefit from vestibular rehab and evaluation by ophthalmologist. 1.  MRI of brain w/wo 2.  MRA head and neck 3.  Follow up soon after  45 minutes spent with patient, over 50% spent counseling and coordinating care.  Thank you for allowing me to take part in the care of this patient.  Metta Clines,  DO  CC:  Carolann Littler, MD

## 2013-02-07 NOTE — Patient Instructions (Addendum)
Most likely, it is related to the peripheral vestibular system (meaning the nerve in the ear or the inner ear itself), rather than something going on in the brain.  But we will get MRI to make sure.  The pulsating sensation in your head is often benign and without any specific cause, but the MRI will look for any specific cause of it.  The headaches are likely tension-type headaches.  I want to see you right after the MRIs so we can review and determine next steps.  Your MRI/MRA is scheduled at [facility] located at [address] (beside the hospital) on Friday, February 15, 2013 at 3:00 pm.   Please arrive 15 minutes prior to your appointment.     [phone].

## 2013-02-11 ENCOUNTER — Other Ambulatory Visit: Payer: Self-pay | Admitting: Physician Assistant

## 2013-02-11 ENCOUNTER — Telehealth: Payer: Self-pay

## 2013-02-11 DIAGNOSIS — J329 Chronic sinusitis, unspecified: Secondary | ICD-10-CM

## 2013-02-11 MED ORDER — HYDROCODONE-HOMATROPINE 5-1.5 MG/5ML PO SYRP: 5.0000 mL | ORAL_SOLUTION | Freq: Every evening | ORAL | Status: DC | PRN

## 2013-02-11 NOTE — Telephone Encounter (Signed)
Refill granted.  No additional refills without return appointment. Rx ready for pickup.  Office open until 7 PM tonight.

## 2013-02-11 NOTE — Telephone Encounter (Signed)
Patient informed and voiced understanding

## 2013-02-11 NOTE — Telephone Encounter (Signed)
Patient states he is out of his cough syrup and it has really helped him. Pt stated he thinks he was in the wind a little bit to much.  Please advise refill of cough syrup? Last RX was done on 01-31-13 quantity 120 mls with 0 refills

## 2013-02-15 ENCOUNTER — Ambulatory Visit (HOSPITAL_COMMUNITY): Payer: 59

## 2013-02-18 ENCOUNTER — Ambulatory Visit: Payer: 59 | Admitting: Neurology

## 2013-03-13 ENCOUNTER — Other Ambulatory Visit: Payer: Self-pay | Admitting: Family Medicine

## 2013-03-13 ENCOUNTER — Telehealth: Payer: Self-pay | Admitting: Family Medicine

## 2013-03-13 NOTE — Telephone Encounter (Signed)
Left a message for patient to return our call. Please try and get ahold of him tomorrow

## 2013-03-13 NOTE — Telephone Encounter (Signed)
Please advise 

## 2013-03-13 NOTE — Telephone Encounter (Signed)
Saw Dr B for a sinus infection was given antibiotic.  He is still not feeling well and would like another round of the antibiotic

## 2013-03-13 NOTE — Telephone Encounter (Signed)
Please call and inform pt of this and get an appt scheduled with Dr Anastasio Auerbach office.

## 2013-03-13 NOTE — Telephone Encounter (Signed)
I reviewed his chart and he has already had 2 antibiotics in past 2 months, I think he will need to be seen again by someone to assess best course of treatment

## 2013-03-14 NOTE — Telephone Encounter (Signed)
Left message for patient to return my call.

## 2013-03-18 NOTE — Telephone Encounter (Signed)
Left message for patient to return my call.

## 2013-09-21 ENCOUNTER — Other Ambulatory Visit: Payer: Self-pay | Admitting: Family Medicine

## 2013-09-29 ENCOUNTER — Other Ambulatory Visit: Payer: Self-pay | Admitting: Family Medicine

## 2013-12-25 ENCOUNTER — Ambulatory Visit (INDEPENDENT_AMBULATORY_CARE_PROVIDER_SITE_OTHER): Payer: 59 | Admitting: Family Medicine

## 2013-12-25 ENCOUNTER — Encounter: Payer: Self-pay | Admitting: Family Medicine

## 2013-12-25 VITALS — BP 137/78 | HR 60 | Temp 98.5°F | Ht 66.0 in | Wt 170.0 lb

## 2013-12-25 DIAGNOSIS — J0100 Acute maxillary sinusitis, unspecified: Secondary | ICD-10-CM

## 2013-12-25 MED ORDER — HYDROCODONE-HOMATROPINE 5-1.5 MG/5ML PO SYRP: 5.0000 mL | ORAL_SOLUTION | ORAL | Status: DC | PRN

## 2013-12-25 MED ORDER — AMOXICILLIN-POT CLAVULANATE 875-125 MG PO TABS: 1.0000 | ORAL_TABLET | Freq: Two times a day (BID) | ORAL | Status: DC

## 2013-12-25 NOTE — Progress Notes (Signed)
Pre visit review using our clinic review tool, if applicable. No additional management support is needed unless otherwise documented below in the visit note. 

## 2013-12-25 NOTE — Progress Notes (Signed)
   Subjective:    Patient ID: Dennis Villarreal, male    DOB: 12-06-1950, 63 y.o.   MRN: [id number]  HPI Here for 5 days of sinus pressure, PND, ST, and a dry cough.    Review of Systems  Constitutional: Negative.   HENT: Positive for congestion, postnasal drip and sinus pressure.   Eyes: Negative.   Respiratory: Positive for cough.        Objective:   Physical Exam  Constitutional: He appears well-developed and well-nourished.  HENT:  Right Ear: External ear normal.  Left Ear: External ear normal.  Nose: Nose normal.  Mouth/Throat: Oropharynx is clear and moist.  Eyes: Conjunctivae are normal.  Pulmonary/Chest: Effort normal and breath sounds normal.  Lymphadenopathy:    He has no cervical adenopathy.          Assessment & Plan:  Written out of work for today

## 2013-12-29 ENCOUNTER — Other Ambulatory Visit: Payer: Self-pay | Admitting: Family Medicine

## 2013-12-30 ENCOUNTER — Other Ambulatory Visit: Payer: Self-pay | Admitting: Family Medicine

## 2014-01-08 ENCOUNTER — Other Ambulatory Visit: Payer: Self-pay | Admitting: Family Medicine

## 2014-02-04 ENCOUNTER — Telehealth: Payer: Self-pay | Admitting: Family Medicine

## 2014-02-04 NOTE — Telephone Encounter (Signed)
Patient Name: Dennis Villarreal  DOB: May 19, 1950    Nurse Assessment  Nurse: Mallie Mussel, RN, Alveta Heimlich Date/Time Eilene Ghazi Time): 02/04/2014 9:44:21 AM  Confirm and document reason for call. If symptomatic, describe symptoms. ---Caller states that he has had a cough which began Sunday. He has fatigue, body aches and a fever which began around noon yesterday. Temp is 100.5 orally. He thinks he has the flu. He denies difficulty breathing. He has some cough medication, hydrocodone/homatropine left over from being seen in December.  Has the patient traveled out of the country within the last 30 days? ---No  Does the patient require triage? ---Yes  Related visit to physician within the last 2 weeks? ---No  Does the PT have any chronic conditions? (i.e. diabetes, asthma, etc.) ---No     Guidelines    Guideline Title Affirmed Question Affirmed Notes  Influenza - Seasonal [1] Probable influenza (fever) with no complications AND [8] NOT HIGH RISK (all triage questions negative)    Final Disposition User   [place], RN, Alveta Heimlich

## 2014-02-04 NOTE — Telephone Encounter (Signed)
Can you please set up appointment.

## 2014-02-04 NOTE — Telephone Encounter (Signed)
Pt states he is hoping to go to work tomorrow. We set a tentative appt for Friday. If he is better, he will call back and cancel appt.

## 2014-02-04 NOTE — Telephone Encounter (Signed)
Saw Dr. Sarajane Jews on 12/25/13

## 2014-02-04 NOTE — Telephone Encounter (Signed)
Pt called bacck and states his fever is back to 101 tonight.  appt made for wed.

## 2014-02-04 NOTE — Telephone Encounter (Signed)
Recommend he be seen for any persistent fever or worsening symptoms.

## 2014-02-05 ENCOUNTER — Ambulatory Visit (INDEPENDENT_AMBULATORY_CARE_PROVIDER_SITE_OTHER): Payer: 59 | Admitting: Family Medicine

## 2014-02-05 ENCOUNTER — Ambulatory Visit (INDEPENDENT_AMBULATORY_CARE_PROVIDER_SITE_OTHER)
Admission: RE | Admit: 2014-02-05 | Discharge: 2014-02-05 | Disposition: A | Discharge status: Home / Self Care | Payer: 59 | Source: Ambulatory Visit | Attending: Family Medicine | Admitting: Family Medicine

## 2014-02-05 ENCOUNTER — Encounter: Payer: Self-pay | Admitting: Family Medicine

## 2014-02-05 VITALS — BP 136/72 | HR 79 | Temp 99.2°F | Wt 171.0 lb

## 2014-02-05 DIAGNOSIS — R509 Fever, unspecified: Secondary | ICD-10-CM

## 2014-02-05 DIAGNOSIS — R059 Cough, unspecified: Secondary | ICD-10-CM

## 2014-02-05 DIAGNOSIS — R05 Cough: Secondary | ICD-10-CM | POA: Diagnosis not present

## 2014-02-05 IMAGING — CR DG CHEST 2V
2 series · 2 of 2 positions shown · non-contrast
Comparison: None available

CLINICAL DATA: Acute cough and fever for 4 days

EXAM:
CHEST - 2 VIEW

[view not recorded (1 of 2)]
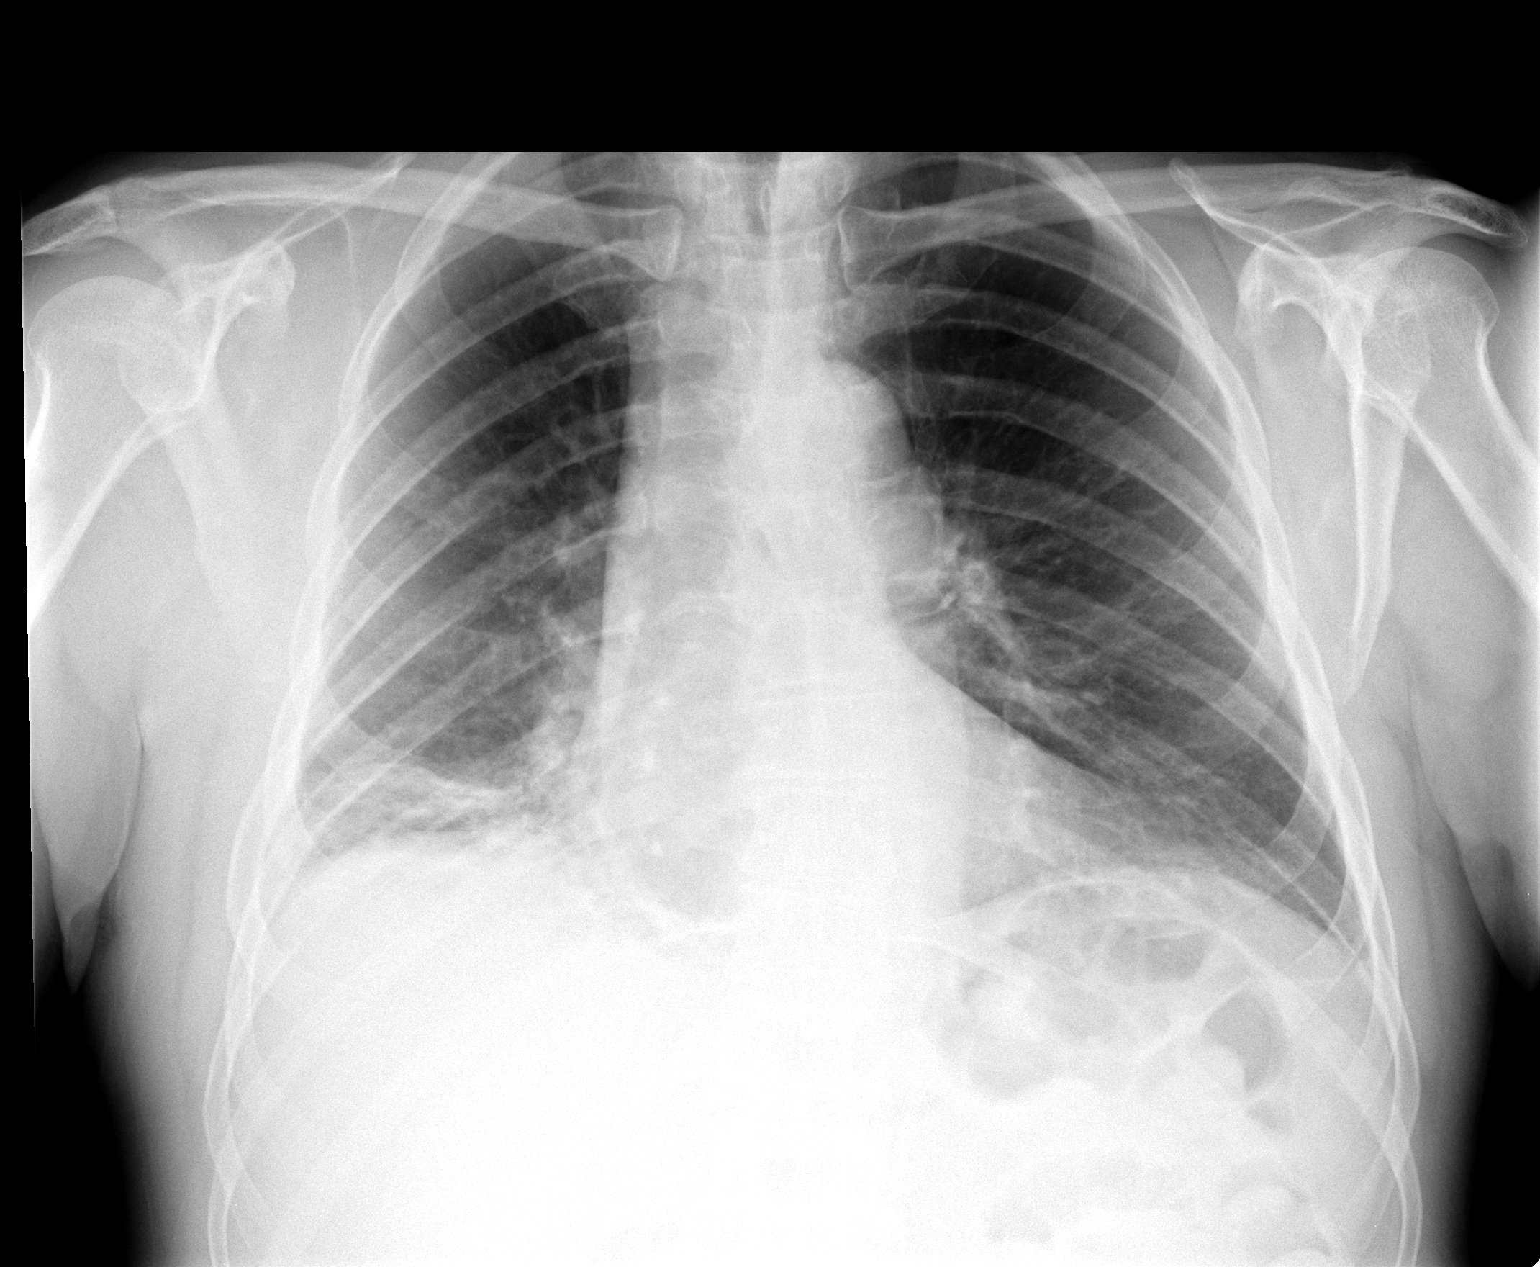

[view not recorded (2 of 2)]
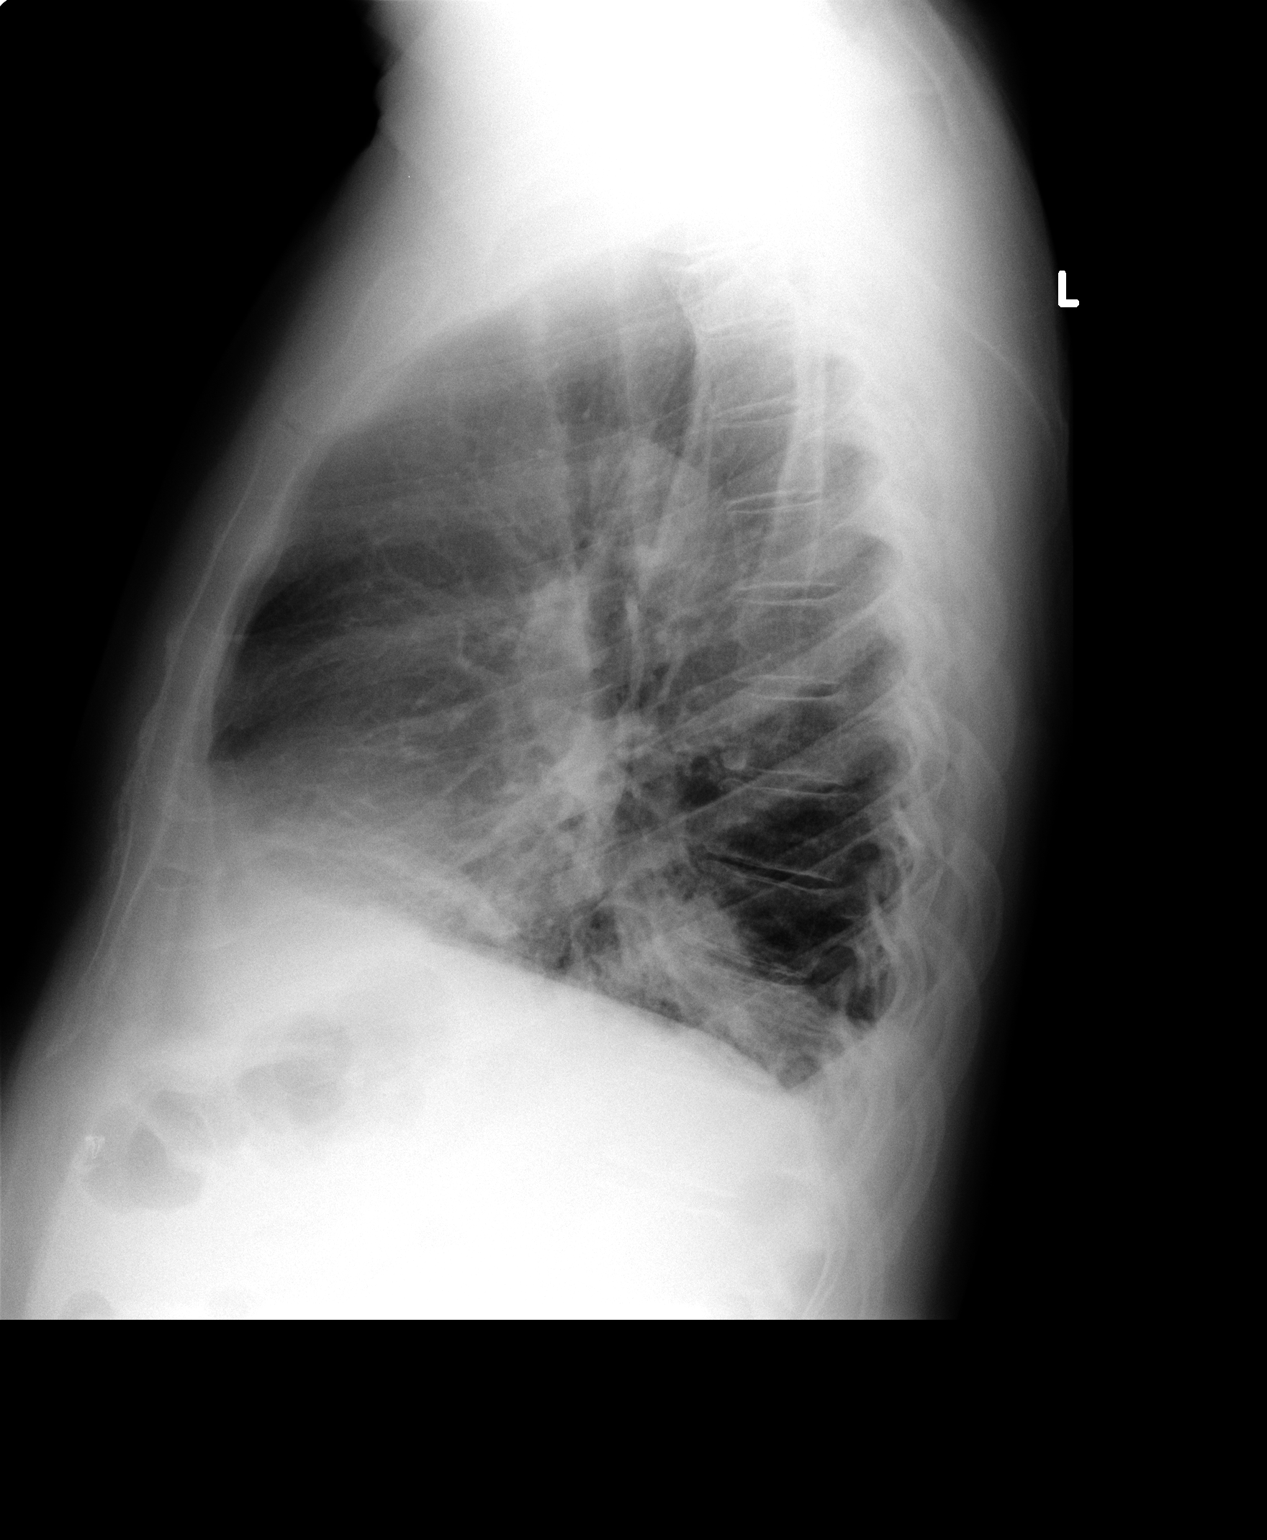

[2 of 2 positions shown; findings below may reference images not displayed]

FINDINGS: Streaky right middle and bibasilar bronchovascular opacities, worse
in the right lower lobe compatible with bilateral pneumonia. Normal
heart size and vascularity. Upper lobes clear. No edema, effusion or
pneumothorax. Trachea midline. Slight curvature of the thoracic
spine.
IMPRESSION: Right middle and bibasilar streaky opacities compatible with basilar
pneumonia, worsened in the right lower lobe.

## 2014-02-05 MED ORDER — AZITHROMYCIN 250 MG PO TABS: ORAL_TABLET | ORAL | Status: AC

## 2014-02-05 NOTE — Patient Instructions (Signed)

## 2014-02-05 NOTE — Telephone Encounter (Signed)
Noted  

## 2014-02-05 NOTE — Progress Notes (Signed)
   Subjective:    Patient ID: Dennis Villarreal, male    DOB: 10/07/1950, 64 y.o.   MRN: [id number]  HPI Patient seen with onset last weekend of cough and past couple days fever up to 101.6. He had upper respiratory illness back in December and was treated Augmentin and seemed to fully improve following that. He has prior allergies to Levaquin. He is not having nausea or vomiting. Increased malaise. Some dyspnea with activity but not at rest. Intermittent headaches. Body aches. Keeping down fluids well. No history of smoking. No sick contacts.  Reviewed with no changes  Past Medical History  Diagnosis Date  . DYSLIPIDEMIA 09/04/2009  . ALLERGIC RHINITIS 09/04/2009  . GERD 09/04/2009  . Cancer 2012    melanoma   Past Surgical History  Procedure Laterality Date  . Tonsillectomy    . Shoulder surgery  1986, 2009    left shoulder scromoplasty    reports that he quit smoking about 32 years ago. His smoking use included Cigarettes. He has a 20 pack-year smoking history. He has never used smokeless tobacco. He reports that he drinks alcohol. He reports that he does not use illicit drugs. family history includes Heart disease in his mother; Stroke in his mother. Allergies  Allergen Reactions  . Levofloxacin     REACTION: hives and GI upset  . Other Hives    levaquin      Review of Systems  Constitutional: Positive for fever, chills and fatigue.  HENT: Negative for congestion and sore throat.   Respiratory: Positive for cough and shortness of breath. Negative for wheezing.   Cardiovascular: Negative for chest pain, palpitations and leg swelling.       Objective:   Physical Exam  Constitutional: He appears well-developed and well-nourished.  HENT:  Right Ear: External ear normal.  Left Ear: External ear normal.  Mouth/Throat: Oropharynx is clear and moist.  Neck: Neck supple.  Cardiovascular: Normal rate and regular rhythm.   Pulmonary/Chest:  Normal respiratory rate. No  respiratory distress. Rales right base. Left base is clear  Musculoskeletal: He exhibits no edema.  Lymphadenopathy:    He has no cervical adenopathy.          Assessment & Plan:  Febrile illness with cough. Asymmetric lung exam with rales right base. Suspect community acquired pneumonia. He does not have evidence for dehydration, increased respiratory rate, hypotension, confusion or other high risk. Sent for chest x-ray. Prior allergy to Levaquin. Start Zithromax. Reassess 2 days. We'll plan to keep him out of work this week

## 2014-02-05 NOTE — Progress Notes (Signed)
Pre visit review using our clinic review tool, if applicable. No additional management support is needed unless otherwise documented below in the visit note. 

## 2014-02-06 ENCOUNTER — Telehealth: Payer: Self-pay | Admitting: Family Medicine

## 2014-02-06 NOTE — Telephone Encounter (Signed)
We should definitely see him back. We'll try to work on some liquids such as may be clear soups first

## 2014-02-06 NOTE — Telephone Encounter (Signed)
Pt is back on his feet today.  Pt states he is not hungry. Would like to know if he should be trying to eat anything. Pt afraid of vomiting.  Do you want to see pt on Friday. Would like a cb if you do not want to see.

## 2014-02-07 ENCOUNTER — Encounter: Payer: Self-pay | Admitting: Family Medicine

## 2014-02-07 ENCOUNTER — Ambulatory Visit (INDEPENDENT_AMBULATORY_CARE_PROVIDER_SITE_OTHER): Payer: 59 | Admitting: Family Medicine

## 2014-02-07 ENCOUNTER — Ambulatory Visit: Payer: Self-pay | Admitting: Family Medicine

## 2014-02-07 VITALS — BP 130/74 | HR 78 | Temp 98.1°F | Wt 168.0 lb

## 2014-02-07 DIAGNOSIS — J189 Pneumonia, unspecified organism: Secondary | ICD-10-CM

## 2014-02-07 NOTE — Progress Notes (Signed)
   Subjective:    Patient ID: Dennis Villarreal, male    DOB: 08-13-1950, 64 y.o.   MRN: [id number]  HPI Follow-up community-acquired pneumonia. Right middle lobe and lower lobe involvement. He had prior history of anaphylaxis with Levaquin. We started Zithromax. His fever is down today. He is having less nausea still poor appetite. Keeping fluids down. Some cough which is nonproductive. He had some nausea with Hycodan cough syrup. Overall feels better. He has some dyspnea with activity but not at rest. Patient did smoke previously for several years but has been quit now for many years. No recent hemoptysis  Past Medical History  Diagnosis Date  . DYSLIPIDEMIA 09/04/2009  . ALLERGIC RHINITIS 09/04/2009  . GERD 09/04/2009  . Cancer 2012    melanoma   Past Surgical History  Procedure Laterality Date  . Tonsillectomy    . Shoulder surgery  1986, 2009    left shoulder scromoplasty    reports that he quit smoking about 32 years ago. His smoking use included Cigarettes. He has a 20 pack-year smoking history. He has never used smokeless tobacco. He reports that he drinks alcohol. He reports that he does not use illicit drugs. family history includes Heart disease in his mother; Stroke in his mother. Allergies  Allergen Reactions  . Levofloxacin     REACTION: hives and GI upset  . Other Hives    levaquin      Review of Systems  Constitutional: Positive for fatigue. Negative for fever and chills.  Respiratory: Positive for cough and shortness of breath.        Objective:   Physical Exam  Constitutional: He appears well-developed and well-nourished.  HENT:  Mouth/Throat: Oropharynx is clear and moist.  Cardiovascular: Normal rate and regular rhythm.   Pulmonary/Chest: Effort normal and breath sounds normal.  He has a few faint rales right base otherwise clear. Normal respiratory rate          Assessment & Plan:  Community acquired pneumonia involving right middle and probably  lower lobes. Clinically improved. Finish out [employer]. Stay hydrated well. Consider over-the-counter Mucinex DM. Wrote prescription for repeat chest x-ray in 3 weeks to make sure this is clearing with his prior smoking history

## 2014-02-07 NOTE — Patient Instructions (Addendum)
Follow up for any fever or increased shortness of breath. Consider Mucinex DM for cough

## 2014-02-07 NOTE — Telephone Encounter (Signed)
Pt is scheduled for 10:30 today.

## 2014-02-07 NOTE — Progress Notes (Signed)
Pre visit review using our clinic review tool, if applicable. No additional management support is needed unless otherwise documented below in the visit note. 

## 2014-02-12 ENCOUNTER — Telehealth: Payer: Self-pay | Admitting: Family Medicine

## 2014-02-12 MED ORDER — HYDROCODONE-HOMATROPINE 5-1.5 MG/5ML PO SYRP: 5.0000 mL | ORAL_SOLUTION | ORAL | Status: DC | PRN

## 2014-02-12 NOTE — Telephone Encounter (Signed)
Pt request refill HYDROcodone-homatropine (HYDROMET) 5-1.5 MG/5ML syrup Pt still has ongoing cough and would like a refill of cough med. Pt has finished antibiotic and is feeling better, just ongoing cough.

## 2014-02-12 NOTE — Telephone Encounter (Signed)
Refill OK

## 2014-02-12 NOTE — Telephone Encounter (Signed)
Last visit = 11/29/14

## 2014-02-12 NOTE — Telephone Encounter (Signed)
Pt is aware that Rx is ready for pickup  

## 2014-02-21 ENCOUNTER — Ambulatory Visit (INDEPENDENT_AMBULATORY_CARE_PROVIDER_SITE_OTHER)
Admission: RE | Admit: 2014-02-21 | Discharge: 2014-02-21 | Disposition: A | Discharge status: Home / Self Care | Payer: 59 | Source: Ambulatory Visit | Attending: Family Medicine | Admitting: Family Medicine

## 2014-02-21 DIAGNOSIS — J189 Pneumonia, unspecified organism: Secondary | ICD-10-CM

## 2014-02-21 IMAGING — CR DG CHEST 2V
2 series · 2 of 2 positions shown · non-contrast
Comparison: [DATE]

CLINICAL DATA: Followup pneumonia, congestion, mild cough, former
smoker, GERD

EXAM:
CHEST  2 VIEW

[view not recorded (1 of 2)]
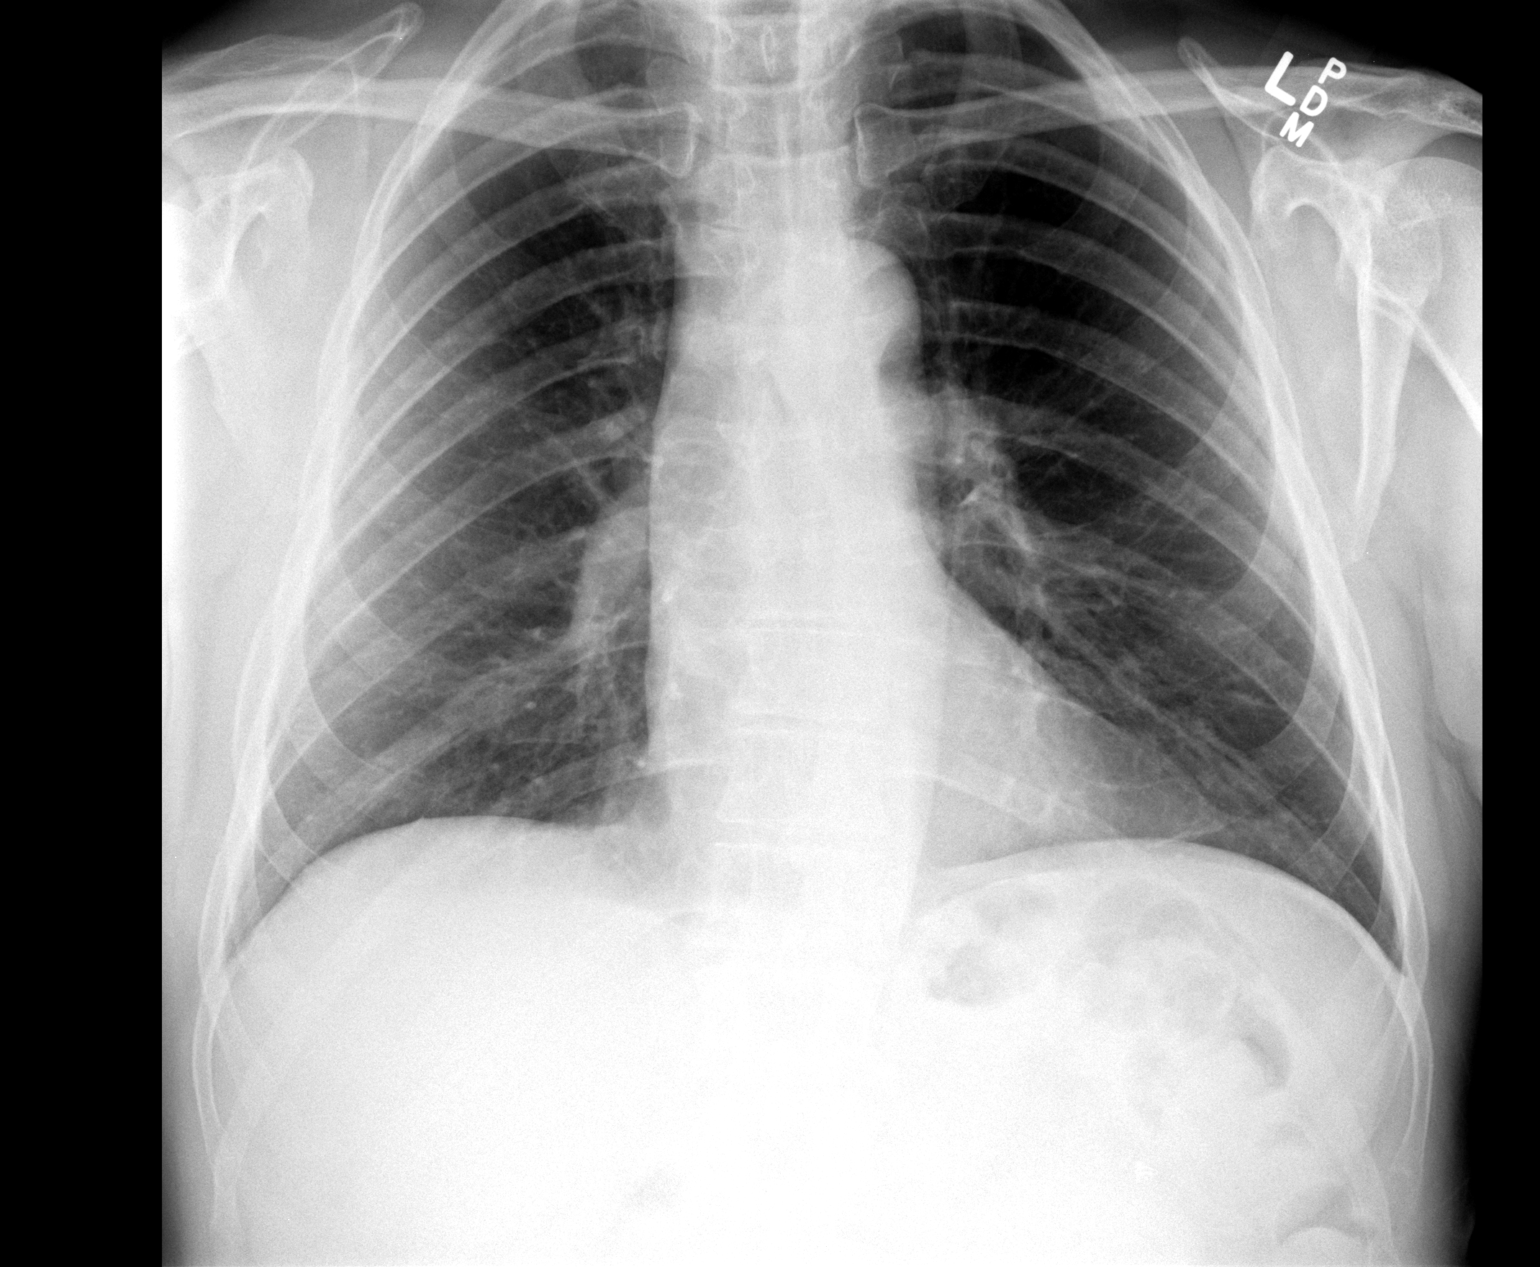

[view not recorded (2 of 2)]
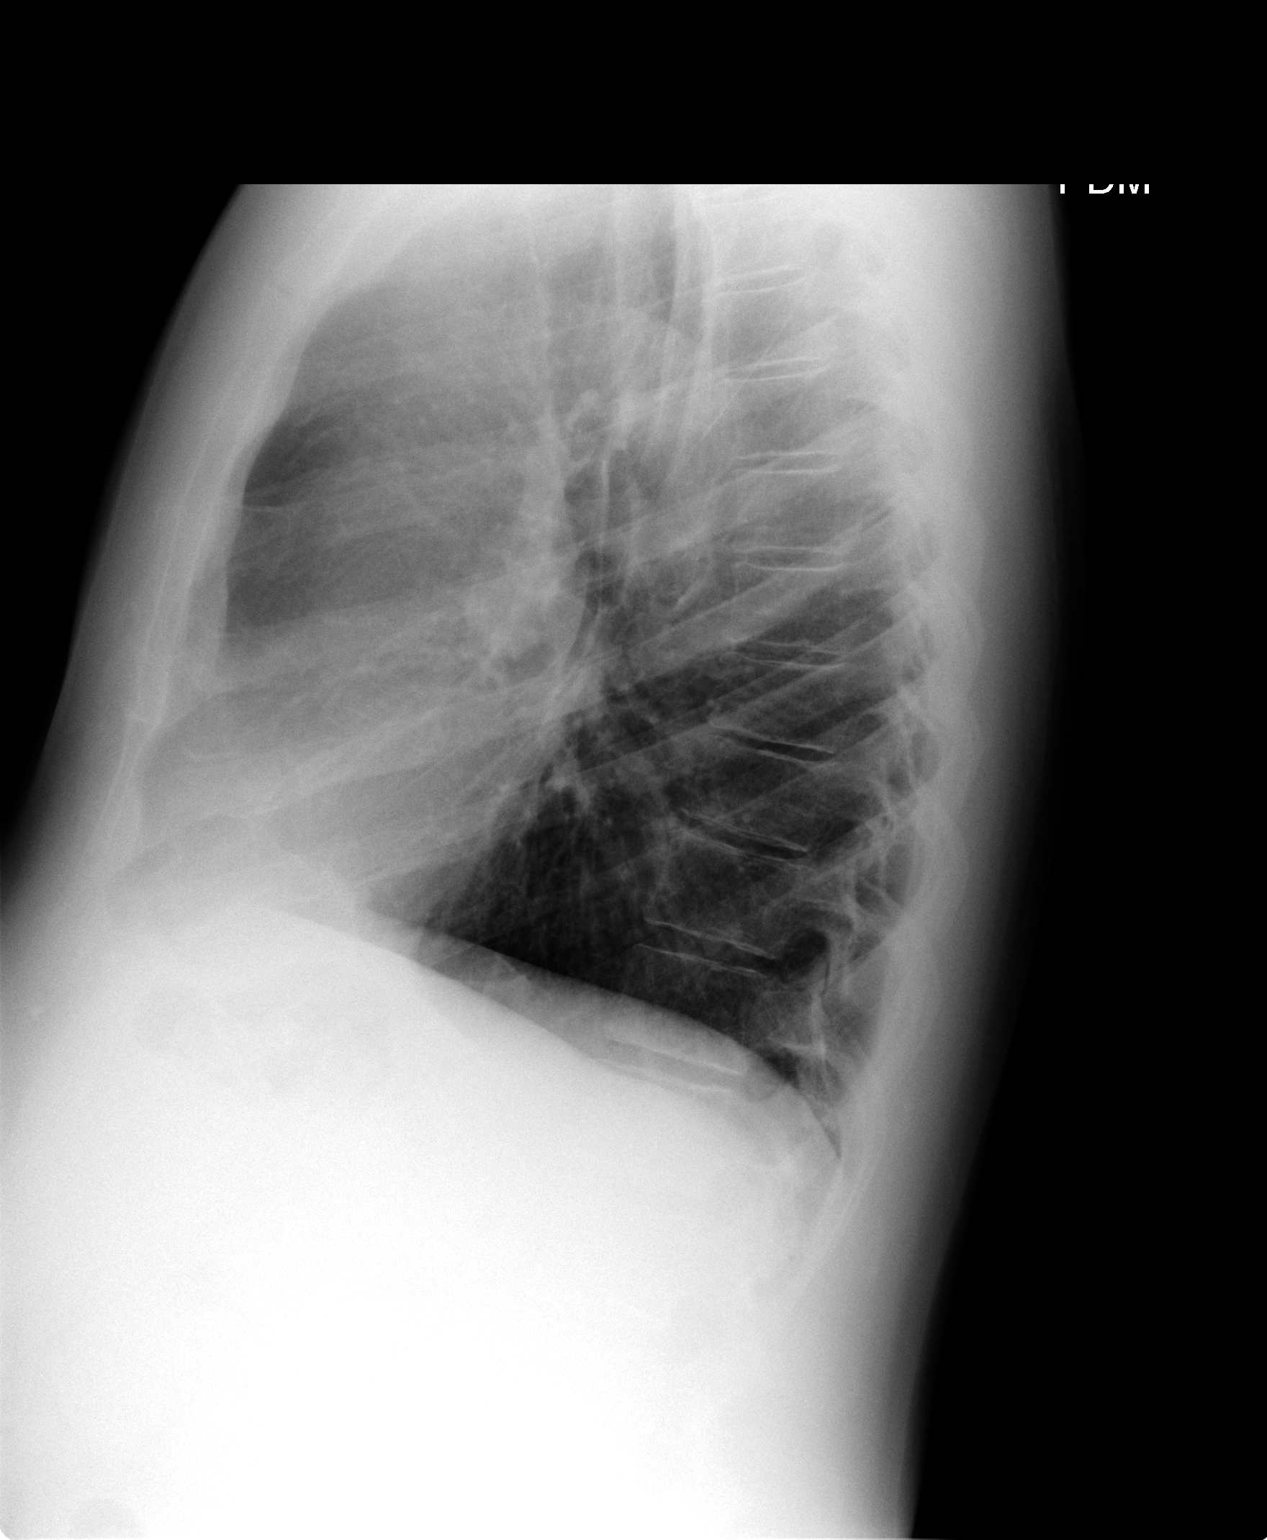

[2 of 2 positions shown; findings below may reference images not displayed]

FINDINGS: Tips of lung apices excluded.

Normal heart size, mediastinal contours and pulmonary vascularity.

Lungs clear.

Improved RIGHT basilar opacities since previous study.

No pleural effusion or gross evidence of pneumothorax within
limitations of apical exclusion.

Bones unremarkable.
IMPRESSION: No acute abnormalities.

Improved RIGHT basilar aeration since previous exam.

## 2014-02-25 ENCOUNTER — Other Ambulatory Visit: Payer: Self-pay | Admitting: Family Medicine

## 2014-02-26 ENCOUNTER — Telehealth: Payer: Self-pay | Admitting: Family Medicine

## 2014-02-26 MED ORDER — BENZONATATE 200 MG PO CAPS: 200.0000 mg | ORAL_CAPSULE | Freq: Three times a day (TID) | ORAL | Status: DC | PRN

## 2014-02-26 NOTE — Telephone Encounter (Signed)
i recommend that we try Tessalon Perles 200 mg q 8 hours prn cough #30.  We need to try to get away from using the hydrocodone chronically to avoid dependency.

## 2014-02-26 NOTE — Telephone Encounter (Signed)
Daughter fu on rx.  Aware that script will be called in to  [facility] [address]

## 2014-02-26 NOTE — Telephone Encounter (Signed)
Pt request refill of the following: HYDROcodone-homatropine (HYDROMET) 5-1.5 MG/5ML syrup ° ° °Phamacy: ° °

## 2014-02-26 NOTE — Telephone Encounter (Signed)
Last visit 02/07/14 Last refill 02/12/14 254ml 0 refill

## 2014-02-26 NOTE — Telephone Encounter (Signed)
Pt called and will pu rx on his way home form work.

## 2014-02-26 NOTE — Telephone Encounter (Signed)
Rx sent to pharmacy   

## 2014-03-17 ENCOUNTER — Telehealth: Payer: Self-pay | Admitting: Family Medicine

## 2014-03-17 ENCOUNTER — Ambulatory Visit: Payer: 59 | Admitting: Family Medicine

## 2014-03-17 ENCOUNTER — Other Ambulatory Visit: Payer: Self-pay | Admitting: Family Medicine

## 2014-03-17 NOTE — Telephone Encounter (Signed)
Refill sent to pharmacy.   

## 2014-03-17 NOTE — Telephone Encounter (Signed)
Pt cancelled 1:45 appt for today. Pt walked in this morning w/ double vision in one eye,. Went to eye md after he left here. They were able to see him right away and  are referring to neuro dr.  However pt did want refill of benzonatate (TESSALON) 200 MG capsule . Pt states this does help w/ his cough.  [facility]

## 2014-03-17 NOTE — Telephone Encounter (Signed)
Refill Tessalon OK.

## 2014-03-17 NOTE — Telephone Encounter (Signed)
[facility] Night - Client TELEPHONE [place] [facility] Patient Name: Dennis Villarreal Gender: Male DOB: 1950/10/02 Age: 64 Y 13 M 23 D Return Phone Number: [phone] (Primary) Address: [address] City/State/Zip: [place] [address] Client [facility] Night - Client Client Site [facility] - Night Physician Carolann Littler Contact Type Call Call Type Triage / Clinical Relationship To Patient Self Return Phone Number [phone] (Primary) Chief Complaint VISION - sudden loss or sudden decrease (NOT blurred vision) Initial Comment Caller states he is experiencing possible ministrokes. His vision decreases and feels dizzy. Yesterday and today he sees a bright flash in his right eye. *would like to speak with on call if possible* PreDisposition Home Care Nurse Assessment Nurse: Melina Modena, RN, Estill Bamberg Date/Time Eilene Ghazi Time): 03/14/2014 7:46:29 PM Confirm and document reason for call. If symptomatic, describe symptoms. ---Caller states he is experiencing possible mini-strokes. His vision decreases and felt dizzy yesterday but that has subsided. Yesterday and today he sees a bright flash in his right eye; double vision Has the patient traveled out of the country within the last 30 days? ---Not Applicable Does the patient require triage? ---Yes Related visit to physician within the last 2 weeks? ---N/A Does the PT have any chronic conditions? (i.e. diabetes, asthma, etc.) ---No Guidelines Guideline Title Affirmed Question Affirmed Notes Nurse Date/Time (Eastern Time) Vision Loss or Change Flashes of light (Exception: transient from pressing on the eyeball) [place], RN, Estill Bamberg 03/14/2014 7:46:50 PM Disp. Time Eilene Ghazi Time) Disposition Final User 03/14/2014 7:42:45 PM Send to Urgent Sherre Poot 03/14/2014 7:49:27 PM See Physician within 4 Hours (or PCP triage) Yes [place],

## 2014-03-17 NOTE — Telephone Encounter (Signed)
Left message on Vm per Dr. B patient needs to go to ER.

## 2014-03-17 NOTE — Telephone Encounter (Signed)
Refill OK

## 2014-03-17 NOTE — Telephone Encounter (Signed)
Spoke with patient. Pt went to [facility] doctor. Eye doctor is going to send patient to Neurology office.

## 2014-03-17 NOTE — Telephone Encounter (Signed)
Spoke with patient he stated that he did not go to any urgent care over the weekend. When he went to his eye doctor everything checked out ok at the visit. The eye is going to send patient to Neurology office Pt stated that he should have appt this week.

## 2014-03-17 NOTE — Telephone Encounter (Signed)
Called pt wife and she stated that the patient is at work. Called patient work number and there was no answer.

## 2014-03-18 ENCOUNTER — Ambulatory Visit (INDEPENDENT_AMBULATORY_CARE_PROVIDER_SITE_OTHER): Payer: 59 | Admitting: Neurology

## 2014-03-18 ENCOUNTER — Encounter: Payer: Self-pay | Admitting: Neurology

## 2014-03-18 VITALS — Ht 66.0 in

## 2014-03-18 DIAGNOSIS — H532 Diplopia: Secondary | ICD-10-CM

## 2014-03-18 DIAGNOSIS — H547 Unspecified visual loss: Secondary | ICD-10-CM | POA: Insufficient documentation

## 2014-03-18 DIAGNOSIS — F411 Generalized anxiety disorder: Secondary | ICD-10-CM

## 2014-03-18 DIAGNOSIS — R42 Dizziness and giddiness: Secondary | ICD-10-CM

## 2014-03-18 NOTE — Progress Notes (Signed)
[facility]    Provider:  Dr Jaynee Eagles Referring Provider: [name], MD Primary Care Physician:  [name], MD  CC:  Diplopia, dizziness,   HPI:  Dennis Villarreal is a 64 y.o. male here as a referral from Dr. Elease Hashimoto for multiple complaints including diplopia, dizziness, vision changes. PMHx HLD, anxiety,   A year ago he started experiencing moments of dizziness, horizontal double vision for about 6 months. A few days ago he had it again. It was a double vision all of a sudden. He has pain in the right eye with headache. Throughout the weekend, Friday night and Saturday he was seeing a light in the periphery of his vision bilaterally. Recent VEP test was normal. He is having anxiety mostly related to work. The double lasts less than a minute. It has happened 6-8 times, mainly when sitting in front of his computer. He never paid attention to it thought his eyes were tired. Usually mid afternoon. No history of headaches, no migraines. No room spinning. He gets lightheaded like he needs to hold onto something. Momentary. Mostly random. No CP, SOB. He is experiencing weakness in the shoulder and arms no pain. No jaw claudication. No pain on eye movements. No previous history of neurologic problems before 18 months ago. He has some tingling and numbness in the toes and feet. Tingling in the fingers with spasms in the left hand. No falls. Has swooshing in his ears, sometimes like birds, it varies. Wears hearing aids. He has history of bilateral hearing loss and chronic tinnitus, progressed over the past several years. Denies room spinning. He is stressed out at work. He hears pulses in his ears. He has significant anxiety.   Reviewed notes, labs and imaging from outside physicians, which showed: He was seen by Dr. Loretta Plume in January 2015 for similar complaints. Dr. Loretta Plume recommended MRI of the brain and MRA, patient declined to get the tests. Notes from ophthalmology OS  20/20, OD 20/20, normal exam, formal visul field testing normal.   Review of Systems: Patient complains of symptoms per HPI as well as the following symptoms: blurred vision, double vision, cough, ringing in ears, memory loss, confusion, dizziness,anxiety, decreased energy, disinterest in activities  Pertinent negatives per HPI. All others negative.   History   Social History  . Marital Status: Married    Spouse Name: N/A  . Number of Children: 3  . Years of Education: College   Occupational History  . Dance movement psychotherapist   Social History Main Topics  . Smoking status: Former Smoker -- 1.00 packs/day for 20 years    Types: Cigarettes    Quit date: 01/24/1982  . Smokeless tobacco: Never Used  . Alcohol Use: 0.0 oz/week    0 Standard drinks or equivalent per week     Comment: socially  . Drug Use: No  . Sexual Activity: Not on file   Other Topics Concern  . Not on file   Social History Narrative   Lives at home with wife and youngest daughter.   Has 3 children.    Caffeine: 2 cups/day     Family History  Problem Relation Age of Onset  . Heart disease Mother     CVA and cardiac arrhythmia, ? atrial fib  . Stroke Mother   . Melanoma Father   . Glaucoma Father   . Glaucoma Brother   . Hypertension Mother     Past Medical History  Diagnosis Date  . DYSLIPIDEMIA 09/04/2009  . ALLERGIC RHINITIS 09/04/2009  . GERD 09/04/2009  . Cancer 2012    melanoma  . High cholesterol     just above borderline per patient     Past Surgical History  Procedure Laterality Date  . Tonsillectomy    . Shoulder surgery  1986, 2009    left shoulder scromoplasty    Current Outpatient Prescriptions  Medication Sig Dispense Refill  . atorvastatin (LIPITOR) 20 MG tablet TAKE 1 TABLET BY MOUTH EVERY DAY 30 tablet 6  . benzonatate (TESSALON) 200 MG capsule TAKE ONE CAPSULE BY MOUTH EVERY 8 HOURS AS NEEDED FOR COUGH 30 capsule 0  . multivitamin (ONE-A-DAY  MEN'S) TABS Take 1 tablet by mouth daily.      . pantoprazole (PROTONIX) 40 MG tablet TAKE 1 TABLET BY MOUTH EVERY DAY 90 tablet 1  . atorvastatin (LIPITOR) 20 MG tablet TAKE 1 TABLET BY MOUTH EVERY DAY (Patient not taking: Reported on 03/18/2014) 30 tablet 11  . Linaclotide (LINZESS) 145 MCG CAPS capsule Take 145 mcg by mouth daily.     No current facility-administered medications for this visit.    Allergies as of 03/18/2014 - Review Complete 03/18/2014  Allergen Reaction Noted  . Levofloxacin  09/04/2009  . Other Hives 02/07/2013    Vitals: Ht 5\' 6"  (1.676 m) Last Weight:  Wt Readings from Last 1 Encounters:  02/07/14 168 lb (76.204 kg)   Last Height:   Ht Readings from Last 1 Encounters:  03/18/14 5\' 6"  (1.676 m)   Physical exam: Exam: Gen: NAD, conversant, well nourised, well groomed                     CV: RRR, no MRG. No Carotid Bruits. No peripheral edema, warm, nontender Eyes: Conjunctivae clear without exudates or hemorrhage  Neuro: Detailed Neurologic Exam  Speech:    Speech is normal; fluent and spontaneous with normal comprehension.  Cognition:    The patient is oriented to person, place, and time;     recent and remote memory intact;     language fluent;     normal attention, concentration,     fund of knowledge Cranial Nerves:    The pupils are equal, round, and reactive to light. The fundi are normal and spontaneous venous pulsations are present. Visual fields are full to finger confrontation. Extraocular movements are intact. Trigeminal sensation is intact and the muscles of mastication are normal. The face is symmetric. The palate elevates in the midline. Hearing impaired . Voice is normal. Shoulder shrug is normal. The tongue has normal motion without fasciculations.   Coordination:    Normal finger to nose and heel to shin. Normal rapid alternating movements.   Gait:    Heel-toe and tandem gait are normal.   Motor Observation:    No asymmetry, no  atrophy, and no involuntary movements noted. Tone:    Normal muscle tone.    Posture:    Posture is normal. normal erect    Strength:    Strength is V/V in the upper and lower limbs.      Sensation: intact to LT     Reflex Exam:  DTR's:    Deep tendon reflexes in the upper and lower extremities are normal bilaterally.   Toes:    The toes are downgoing bilaterally.   Clonus:    Clonus is absent.       Assessment/Plan:  HPI:  MARSDEN ZAINO is a 64  y.o. male here as a referral from Dr. Elease Hashimoto for multiple complaints including diplopia, dizziness, vision changes, fatigue. PMHx HLD, anxiety. He describes acute onset diplopia, pain in the eye with headache, pulsatile tinnitus. Also has episodes of feeling lightheaded like he is going to pass out. Also has some other complaints including paresthesias.   -Says he has "gone round and round" for his anxiety with his pcp and asks if I can recommend anything. Suggest psychiatry at this point to explore medication for his anxiety. - Having episodes of presyncope, will defer back to pcp for cardiac workup as clinically warranted - Will order MRI of the brain, MRA of the head and neck, labwork - Suggest daily ASA. Folllow closely with PCP to manage vascular risk factors.   Sarina Ill, MD  [facility] [address] [place], [place] [address]  Phone [phone] Fax [phone]

## 2014-03-18 NOTE — Patient Instructions (Signed)
Overall you are doing fairly well but I do want to suggest a few things today:   Remember to drink plenty of fluid, eat healthy meals and do not skip any meals. Try to eat protein with a every meal and eat a healthy snack such as fruit or nuts in between meals. Try to keep a regular sleep-wake schedule and try to exercise daily, particularly in the form of walking, 20-30 minutes a day, if you can.   As far as your medications are concerned, I would like to suggest: Daily ASA  As far as diagnostic testing: MRI of the brain, MRA of the head and neck, Labs  I would like to see you back after workup complete, sooner if we need to. Please call us with any interim questions, concerns, problems, updates or refill requests.   Please also call us for any test results so we can go over those with you on the phone.  My clinical assistant and will answer any of your questions and relay your messages to me and also relay most of my messages to you.   Our phone number is [phone]. We also have an after hours call service for urgent matters and there is a physician on-call for urgent questions. For any emergencies you know to call 911 or go to the nearest emergency room

## 2014-03-19 LAB — THYROID PANEL WITH TSH
Free Thyroxine Index: 2 (ref 1.2–4.9)
T3 Uptake Ratio: 29 % (ref 24–39)
T4 TOTAL: 7 ug/dL (ref 4.5–12.0)
TSH: 1.56 u[IU]/mL (ref 0.450–4.500)

## 2014-03-19 LAB — COMPREHENSIVE METABOLIC PANEL
A/G RATIO: 1.8 (ref 1.1–2.5)
ALBUMIN: 4.3 g/dL (ref 3.6–4.8)
ALT: 14 IU/L (ref 0–44)
AST: 22 IU/L (ref 0–40)
Alkaline Phosphatase: 54 IU/L (ref 39–117)
BUN/Creatinine Ratio: 16 (ref 10–22)
BUN: 14 mg/dL (ref 8–27)
Bilirubin Total: 0.7 mg/dL (ref 0.0–1.2)
CALCIUM: 9.3 mg/dL (ref 8.6–10.2)
CO2: 26 mmol/L (ref 18–29)
CREATININE: 0.87 mg/dL (ref 0.76–1.27)
Chloride: 101 mmol/L (ref 97–108)
GFR, EST AFRICAN AMERICAN: 106 mL/min/{1.73_m2} (ref >59)
GFR, EST NON AFRICAN AMERICAN: 92 mL/min/{1.73_m2} (ref >59)
GLOBULIN, TOTAL: 2.4 g/dL (ref 1.5–4.5)
GLUCOSE: 79 mg/dL (ref 65–99)
Potassium: 5 mmol/L (ref 3.5–5.2)
Sodium: 140 mmol/L (ref 134–144)
Total Protein: 6.7 g/dL (ref 6.0–8.5)

## 2014-03-19 LAB — HEMOGLOBIN A1C
Est. average glucose Bld gHb Est-mCnc: 111 mg/dL
Hgb A1c MFr Bld: 5.5 % (ref 4.8–5.6)

## 2014-03-19 LAB — SEDIMENTATION RATE: Sed Rate: 2 mm/hr (ref 0–30)

## 2014-03-19 LAB — C-REACTIVE PROTEIN: CRP: 0.1 mg/L (ref 0.0–4.9)

## 2014-03-20 ENCOUNTER — Ambulatory Visit (INDEPENDENT_AMBULATORY_CARE_PROVIDER_SITE_OTHER): Payer: 59

## 2014-03-20 ENCOUNTER — Telehealth: Payer: Self-pay | Admitting: *Deleted

## 2014-03-20 DIAGNOSIS — R42 Dizziness and giddiness: Secondary | ICD-10-CM

## 2014-03-20 DIAGNOSIS — H532 Diplopia: Secondary | ICD-10-CM

## 2014-03-20 DIAGNOSIS — H547 Unspecified visual loss: Secondary | ICD-10-CM

## 2014-03-20 MED ORDER — GADOPENTETATE DIMEGLUMINE 469.01 MG/ML IV SOLN: 20.0000 mL | Freq: Once | INTRAVENOUS | Status: AC | PRN

## 2014-03-20 NOTE — Telephone Encounter (Signed)
Left a message on patients voicemail that lab were normal. Gave [place] phone number and told patient to call back if they had any more questions.

## 2014-03-25 ENCOUNTER — Telehealth: Payer: Self-pay | Admitting: Neurology

## 2014-03-25 ENCOUNTER — Other Ambulatory Visit (INDEPENDENT_AMBULATORY_CARE_PROVIDER_SITE_OTHER): Payer: 59

## 2014-03-25 DIAGNOSIS — Z0000 Encounter for general adult medical examination without abnormal findings: Secondary | ICD-10-CM

## 2014-03-25 LAB — CBC WITH DIFFERENTIAL/PLATELET
BASOS ABS: 0 10*3/uL (ref 0.0–0.1)
Basophils Relative: 0.3 % (ref 0.0–3.0)
Eosinophils Absolute: 0.2 10*3/uL (ref 0.0–0.7)
Eosinophils Relative: 2.4 % (ref 0.0–5.0)
HEMATOCRIT: 42 % (ref 39.0–52.0)
Hemoglobin: 14.4 g/dL (ref 13.0–17.0)
Lymphocytes Relative: 33.9 % (ref 12.0–46.0)
Lymphs Abs: 2.1 10*3/uL (ref 0.7–4.0)
MCHC: 34.3 g/dL (ref 30.0–36.0)
MCV: 92.1 fl (ref 78.0–100.0)
MONO ABS: 0.4 10*3/uL (ref 0.1–1.0)
Monocytes Relative: 6.8 % (ref 3.0–12.0)
NEUTROS PCT: 56.6 % (ref 43.0–77.0)
Neutro Abs: 3.6 10*3/uL (ref 1.4–7.7)
Platelets: 172 10*3/uL (ref 150.0–400.0)
RBC: 4.57 Mil/uL (ref 4.22–5.81)
RDW: 13.5 % (ref 11.5–15.5)
WBC: 6.3 10*3/uL (ref 4.0–10.5)

## 2014-03-25 LAB — PSA: PSA: 1.06 ng/mL (ref 0.10–4.00)

## 2014-03-25 LAB — HEPATIC FUNCTION PANEL
ALT: 14 U/L (ref 0–53)
AST: 18 U/L (ref 0–37)
Albumin: 4.3 g/dL (ref 3.5–5.2)
Alkaline Phosphatase: 57 U/L (ref 39–117)
BILIRUBIN DIRECT: 0.2 mg/dL (ref 0.0–0.3)
BILIRUBIN TOTAL: 1.1 mg/dL (ref 0.2–1.2)
Total Protein: 6.8 g/dL (ref 6.0–8.3)

## 2014-03-25 LAB — BASIC METABOLIC PANEL
BUN: 14 mg/dL (ref 6–23)
CO2: 31 meq/L (ref 19–32)
CREATININE: 0.82 mg/dL (ref 0.40–1.50)
Calcium: 9.2 mg/dL (ref 8.4–10.5)
Chloride: 103 mEq/L (ref 96–112)
GFR: 100.69 mL/min (ref >60.00)
GLUCOSE: 88 mg/dL (ref 70–99)
Potassium: 4.2 mEq/L (ref 3.5–5.1)
Sodium: 138 mEq/L (ref 135–145)

## 2014-03-25 LAB — LIPID PANEL
Cholesterol: 179 mg/dL (ref 0–200)
HDL: 43.7 mg/dL (ref >39.00)
LDL CALC: 116 mg/dL — AB (ref 0–99)
NonHDL: 135.3
Total CHOL/HDL Ratio: 4
Triglycerides: 96 mg/dL (ref 0.0–149.0)
VLDL: 19.2 mg/dL (ref 0.0–40.0)

## 2014-03-25 LAB — TSH: TSH: 1.06 u[IU]/mL (ref 0.35–4.50)

## 2014-03-25 NOTE — Telephone Encounter (Signed)
Called patient to discuss the images of his brain and neck. He was not home, left message that I will try again tomorrow.

## 2014-03-26 NOTE — Telephone Encounter (Signed)
Pt called back returning your call, would like a call back.

## 2014-03-26 NOTE — Telephone Encounter (Signed)
Called again this evening, no answer. Left message.   Terrence Dupont - would you call patient back please and ask him to make an appointment to discuss MRI findings? I probably need 30 minutes with this patient. Have tried to call him a few times, playing phone tag. Thanks.

## 2014-03-27 ENCOUNTER — Telehealth: Payer: Self-pay | Admitting: *Deleted

## 2014-03-27 NOTE — Telephone Encounter (Signed)
Talked with patient and he said Dr. Jaynee Eagles left a voicemail saying it was not an urgent matter and she would discuss the results with him over the phone. I told him I would check with Dr. Jaynee Eagles  About this because in her notes she wanted him to set up an appointment to come in. Told him we would give him a call back. Pt verbalized understanding.

## 2014-03-27 NOTE — Telephone Encounter (Signed)
Discussed MRI of the brain, MRA of the head and neck with patient. He has a right basal ganglia remote-age infarct likely due to small-vessel disease. He also has non-specific white matter changes likely small-vessel ischemic changes.  Discussed following closely with his pcp to manage vascular risk factors and taking daily asa 81mg .for stroke prevention.  If he likes, he is welcome to follow up in the office so that I can show him the images. Thank you.

## 2014-04-02 ENCOUNTER — Encounter: Payer: Self-pay | Admitting: Family Medicine

## 2014-04-02 ENCOUNTER — Ambulatory Visit (INDEPENDENT_AMBULATORY_CARE_PROVIDER_SITE_OTHER): Payer: 59 | Admitting: Family Medicine

## 2014-04-02 VITALS — BP 128/78 | HR 60 | Temp 98.4°F | Ht 66.0 in | Wt 170.0 lb

## 2014-04-02 DIAGNOSIS — Z23 Encounter for immunization: Secondary | ICD-10-CM | POA: Diagnosis not present

## 2014-04-02 DIAGNOSIS — Z0000 Encounter for general adult medical examination without abnormal findings: Secondary | ICD-10-CM | POA: Diagnosis not present

## 2014-04-02 NOTE — Progress Notes (Signed)
Pre visit review using our clinic review tool, if applicable. No additional management support is needed unless otherwise documented below in the visit note. 

## 2014-04-02 NOTE — Patient Instructions (Signed)
Check on coverage for shingles vaccine 

## 2014-04-02 NOTE — Progress Notes (Signed)
Subjective:    Patient ID: Dennis Villarreal, male    DOB: [birth year], 64 y.o.   MRN: [id number]  HPI Patient seen for complete physical. He has had some recent issues with diplopia and has had neurologic workup which has been unrevealing. He has history of hyperlipidemia and GERD. Remains on Lipitor and Protonix. He has occasional breakthrough GERD symptoms. No dysphagia. Appetite and weight are stable. Remote history of melanoma. He is followed by dermatologist twice yearly for screening  Last tetanus unknown. Other immunizations up-to-date but exception of no shingles. Former smoker. Quit 1984. Denies any sore throat or dysphagia.  Past Medical History  Diagnosis Date  . DYSLIPIDEMIA 09/04/2009  . ALLERGIC RHINITIS 09/04/2009  . GERD 09/04/2009  . Cancer 2012    melanoma  . High cholesterol     just above borderline per patient    Past Surgical History  Procedure Laterality Date  . Tonsillectomy    . Shoulder surgery  1986, 2009    left shoulder scromoplasty    reports that he quit smoking about 32 years ago. His smoking use included Cigarettes. He has a 20 pack-year smoking history. He has never used smokeless tobacco. He reports that he drinks alcohol. He reports that he does not use illicit drugs. family history includes Glaucoma in his brother and father; Heart disease in his mother; Hypertension in his mother; Melanoma in his father; Stroke in his mother. Allergies  Allergen Reactions  . Levofloxacin     REACTION: hives and GI upset  . Other Hives    levaquin      Review of Systems  Constitutional: Negative for fever, activity change, appetite change and fatigue.  HENT: Negative for congestion, ear pain, sore throat, trouble swallowing and voice change.   Eyes: Negative for pain and visual disturbance.  Respiratory: Negative for cough, shortness of breath and wheezing.   Cardiovascular: Negative for chest pain and palpitations.  Gastrointestinal: Negative for nausea,  vomiting, abdominal pain, diarrhea, constipation, blood in stool, abdominal distention and rectal pain.  Genitourinary: Negative for dysuria, hematuria and testicular pain.  Musculoskeletal: Negative for joint swelling and arthralgias.  Skin: Negative for rash.  Neurological: Negative for dizziness, syncope and headaches.  Hematological: Negative for adenopathy.  Psychiatric/Behavioral: Negative for confusion and dysphoric mood.       Objective:   Physical Exam  Constitutional: He is oriented to person, place, and time. He appears well-developed and well-nourished. No distress.  HENT:  Head: Normocephalic and atraumatic.  Right Ear: External ear normal.  Left Ear: External ear normal.  He's had previous tonsillectomy. He does have some pinkish to reddish colored tissue in the left tonsillar region which may be some residual tonsillar tissue.  Eyes: Conjunctivae and EOM are normal. Pupils are equal, round, and reactive to light.  Neck: Normal range of motion. Neck supple. No thyromegaly present.  No neck adenopathy  Cardiovascular: Normal rate, regular rhythm and normal heart sounds.   No murmur heard. Pulmonary/Chest: No respiratory distress. He has no wheezes. He has no rales.  Abdominal: Soft. Bowel sounds are normal. He exhibits no distension and no mass. There is no tenderness. There is no rebound and no guarding.  Musculoskeletal: He exhibits no edema.  Lymphadenopathy:    He has no cervical adenopathy.  Neurological: He is alert and oriented to person, place, and time. He displays normal reflexes. No cranial nerve deficit.  Skin: No rash noted.  Psychiatric: He has a normal mood and affect.  Assessment & Plan:  Complete physical. Labs reviewed with no major concerns. Tetanus booster given. Check on coverage for shingles vaccine. Reassess 2 weeks to reassess oropharyngeal exam. He has some redness left tonsillar region which may be from his recent vomiting. If still  prominent at that point consider ENT referral.

## 2014-04-16 ENCOUNTER — Ambulatory Visit (INDEPENDENT_AMBULATORY_CARE_PROVIDER_SITE_OTHER): Payer: 59 | Admitting: Family Medicine

## 2014-04-16 ENCOUNTER — Encounter: Payer: Self-pay | Admitting: Family Medicine

## 2014-04-16 VITALS — BP 118/76 | HR 56 | Temp 97.7°F | Wt 170.0 lb

## 2014-04-16 DIAGNOSIS — J358 Other chronic diseases of tonsils and adenoids: Secondary | ICD-10-CM | POA: Diagnosis not present

## 2014-04-16 NOTE — Progress Notes (Signed)
   Subjective:    Patient ID: Dennis Villarreal, male    DOB: 1950/08/26, 64 y.o.   MRN: [id number]  HPI Patient seen for recent physical exam. Incidental note of some prominent tissue left tonsillar region. He quit smoking 1984. He did have recent episode of vomiting but has not had any sore throat complaints nor any appetite or weight changes. No adenopathy noted. He states he had tonsillectomy around age 73.  Past Medical History  Diagnosis Date  . DYSLIPIDEMIA 09/04/2009  . ALLERGIC RHINITIS 09/04/2009  . GERD 09/04/2009  . Cancer 2012    melanoma  . High cholesterol     just above borderline per patient    Past Surgical History  Procedure Laterality Date  . Tonsillectomy    . Shoulder surgery  1986, 2009    left shoulder scromoplasty    reports that he quit smoking about 32 years ago. His smoking use included Cigarettes. He has a 20 pack-year smoking history. He has never used smokeless tobacco. He reports that he drinks alcohol. He reports that he does not use illicit drugs. family history includes Glaucoma in his brother and father; Heart disease in his mother; Hypertension in his mother; Melanoma in his father; Stroke in his mother. Allergies  Allergen Reactions  . Levofloxacin     REACTION: hives and GI upset  . Other Hives    levaquin      Review of Systems  Constitutional: Negative for fever, chills, appetite change and unexpected weight change.  Hematological: Negative for adenopathy.       Objective:   Physical Exam  Constitutional: He appears well-developed and well-nourished.  HENT:  Oropharynx reveals some pinkish colored tissue left tonsillar region. This is more prominent in the left side.  Neck: Neck supple.  Cardiovascular: Normal rate and regular rhythm.   Pulmonary/Chest: Effort normal and breath sounds normal. No respiratory distress. He has no wheezes. He has no rales.  Lymphadenopathy:    He has no cervical adenopathy.          Assessment &  Plan:  Prominence of left tonsillar tissue. He's had previous tonsillectomy. This may simply be some residual tonsillar tissue but would like to get ENT opinion. He does not have any worrisome symptoms such as appetite or weight changes or any adenopathy

## 2014-04-16 NOTE — Patient Instructions (Signed)
We will call you regarding ENT referral.

## 2014-04-16 NOTE — Progress Notes (Signed)
Pre visit review using our clinic review tool, if applicable. No additional management support is needed unless otherwise documented below in the visit note. 

## 2014-06-18 ENCOUNTER — Other Ambulatory Visit: Payer: Self-pay | Admitting: Family Medicine

## 2014-09-08 ENCOUNTER — Other Ambulatory Visit: Payer: Self-pay | Admitting: Family Medicine

## 2014-11-10 ENCOUNTER — Other Ambulatory Visit: Payer: Self-pay | Admitting: Family Medicine

## 2015-01-06 ENCOUNTER — Telehealth: Payer: Self-pay | Admitting: Family Medicine

## 2015-01-06 DIAGNOSIS — N50819 Testicular pain, unspecified: Secondary | ICD-10-CM

## 2015-01-06 NOTE — Telephone Encounter (Signed)
Dennis Villarreal called saying he'd like a referral to a Urologist due to having testicular pain. He's scheduled for an appt with Dr. Elease Hashimoto on 12.22.16. If you have questions or concerns, please feel free to call him.  Pt's ph# [phone] Thank you.

## 2015-01-06 NOTE — Telephone Encounter (Signed)
Pt has already been seen for this issue by Dr. Elease Hashimoto. Due to the pain not completely resolving he would like to be referred to a specialist. He is going to contact his insurance company and call me back with offices in his network.

## 2015-01-06 NOTE — Telephone Encounter (Signed)
Patient says Dr Jeffie Pollock is who he would like to see at [facility]. He says he will take the first available. He would also like to be placed on the cancellation list.

## 2015-01-06 NOTE — Telephone Encounter (Signed)
Left message to see if we can discuss at up coming appt.

## 2015-01-07 NOTE — Telephone Encounter (Signed)
Spoke with patient and referral entered.

## 2015-01-08 ENCOUNTER — Ambulatory Visit: Payer: 59 | Admitting: Family Medicine

## 2015-01-15 ENCOUNTER — Ambulatory Visit: Payer: Self-pay | Admitting: Family Medicine

## 2015-03-23 ENCOUNTER — Other Ambulatory Visit: Payer: Self-pay | Admitting: Family Medicine

## 2015-05-21 ENCOUNTER — Other Ambulatory Visit (INDEPENDENT_AMBULATORY_CARE_PROVIDER_SITE_OTHER): Payer: Commercial Managed Care - PPO

## 2015-05-21 DIAGNOSIS — Z0000 Encounter for general adult medical examination without abnormal findings: Secondary | ICD-10-CM | POA: Diagnosis not present

## 2015-05-21 LAB — CBC WITH DIFFERENTIAL/PLATELET
BASOS ABS: 0 10*3/uL (ref 0.0–0.1)
BASOS PCT: 0.3 % (ref 0.0–3.0)
EOS ABS: 0.3 10*3/uL (ref 0.0–0.7)
Eosinophils Relative: 4.7 % (ref 0.0–5.0)
HCT: 44.1 % (ref 39.0–52.0)
Hemoglobin: 15.2 g/dL (ref 13.0–17.0)
LYMPHS ABS: 1.9 10*3/uL (ref 0.7–4.0)
Lymphocytes Relative: 28.4 % (ref 12.0–46.0)
MCHC: 34.4 g/dL (ref 30.0–36.0)
MCV: 92.1 fl (ref 78.0–100.0)
MONOS PCT: 7.2 % (ref 3.0–12.0)
Monocytes Absolute: 0.5 10*3/uL (ref 0.1–1.0)
NEUTROS ABS: 3.9 10*3/uL (ref 1.4–7.7)
Neutrophils Relative %: 59.4 % (ref 43.0–77.0)
PLATELETS: 187 10*3/uL (ref 150.0–400.0)
RBC: 4.78 Mil/uL (ref 4.22–5.81)
RDW: 13.4 % (ref 11.5–15.5)
WBC: 6.6 10*3/uL (ref 4.0–10.5)

## 2015-05-21 LAB — HEPATIC FUNCTION PANEL
ALK PHOS: 61 U/L (ref 39–117)
ALT: 17 U/L (ref 0–53)
AST: 21 U/L (ref 0–37)
Albumin: 4.4 g/dL (ref 3.5–5.2)
BILIRUBIN DIRECT: 0.2 mg/dL (ref 0.0–0.3)
Total Bilirubin: 1 mg/dL (ref 0.2–1.2)
Total Protein: 6.9 g/dL (ref 6.0–8.3)

## 2015-05-21 LAB — BASIC METABOLIC PANEL
BUN: 13 mg/dL (ref 6–23)
CO2: 30 meq/L (ref 19–32)
Calcium: 9.3 mg/dL (ref 8.4–10.5)
Chloride: 105 mEq/L (ref 96–112)
Creatinine, Ser: 0.95 mg/dL (ref 0.40–1.50)
GFR: 84.66 mL/min (ref >60.00)
GLUCOSE: 100 mg/dL — AB (ref 70–99)
Potassium: 4.8 mEq/L (ref 3.5–5.1)
SODIUM: 141 meq/L (ref 135–145)

## 2015-05-21 LAB — LIPID PANEL
CHOL/HDL RATIO: 4
Cholesterol: 162 mg/dL (ref 0–200)
HDL: 40.9 mg/dL (ref >39.00)
LDL Cholesterol: 106 mg/dL — ABNORMAL HIGH (ref 0–99)
NONHDL: 120.97
Triglycerides: 77 mg/dL (ref 0.0–149.0)
VLDL: 15.4 mg/dL (ref 0.0–40.0)

## 2015-05-21 LAB — TSH: TSH: 1.15 u[IU]/mL (ref 0.35–4.50)

## 2015-05-21 LAB — PSA: PSA: 1.65 ng/mL (ref 0.10–4.00)

## 2015-05-25 ENCOUNTER — Encounter: Payer: Self-pay | Admitting: Family Medicine

## 2015-05-25 ENCOUNTER — Ambulatory Visit (INDEPENDENT_AMBULATORY_CARE_PROVIDER_SITE_OTHER): Payer: Commercial Managed Care - PPO | Admitting: Family Medicine

## 2015-05-25 VITALS — BP 120/80 | HR 75 | Temp 98.3°F | Ht 66.0 in | Wt 171.3 lb

## 2015-05-25 DIAGNOSIS — Z0000 Encounter for general adult medical examination without abnormal findings: Secondary | ICD-10-CM

## 2015-05-25 NOTE — Progress Notes (Signed)
Subjective:    Patient ID: Dennis Villarreal, male    DOB: 1950/12/24, 65 y.o.   MRN: [id number]  HPI  Patient seen for complete physical  He has history of melanoma and gets yearly checkups with dermatology, though he has not been checked thus far this year. Hyperlipidemia treated with atorvastatin. He takes pantoprazole for reflux symptoms. Denies any medication side effects.   No history of shingles vaccine. Tetanus up-to-date. Colonoscopy will be due next year. No consistent exercise. Quit smoking 1984  Past Medical History  Diagnosis Date  . DYSLIPIDEMIA 09/04/2009  . ALLERGIC RHINITIS 09/04/2009  . GERD 09/04/2009  . Cancer ([place]) 2012    melanoma  . High cholesterol     just above borderline per patient    Past Surgical History  Procedure Laterality Date  . Tonsillectomy    . Shoulder surgery  1986, 2009    left shoulder scromoplasty    reports that he quit smoking about 33 years ago. His smoking use included Cigarettes. He has a 20 pack-year smoking history. He has never used smokeless tobacco. He reports that he drinks alcohol. He reports that he does not use illicit drugs. family history includes Glaucoma in his brother and father; Heart disease in his mother; Hypertension in his mother; Melanoma in his father; Stroke in his mother. Allergies  Allergen Reactions  . Levofloxacin     REACTION: hives and GI upset  . Other Hives    levaquin       Review of Systems  Constitutional: Negative for fever, activity change, appetite change and fatigue.  HENT: Negative for congestion, ear pain and trouble swallowing.   Eyes: Negative for pain and visual disturbance.  Respiratory: Negative for cough, shortness of breath and wheezing.   Cardiovascular: Negative for chest pain and palpitations.  Gastrointestinal: Negative for nausea, vomiting, abdominal pain, diarrhea, constipation, blood in stool, abdominal distention and rectal pain.  Endocrine: Negative for polydipsia and  polyuria.  Genitourinary: Negative for dysuria, hematuria and testicular pain.  Musculoskeletal: Negative for joint swelling and arthralgias.  Skin: Negative for rash.  Neurological: Negative for dizziness, syncope and headaches.  Hematological: Negative for adenopathy.  Psychiatric/Behavioral: Negative for confusion and dysphoric mood.       Objective:   Physical Exam  Constitutional: He is oriented to person, place, and time. He appears well-developed and well-nourished. No distress.  HENT:  Head: Normocephalic and atraumatic.  Right Ear: External ear normal.  Left Ear: External ear normal.  Mouth/Throat: Oropharynx is clear and moist.  Eyes: Conjunctivae and EOM are normal. Pupils are equal, round, and reactive to light.  Neck: Normal range of motion. Neck supple. No thyromegaly present.  Cardiovascular: Normal rate, regular rhythm and normal heart sounds.   No murmur heard. Pulmonary/Chest: No respiratory distress. He has no wheezes. He has no rales.  Abdominal: Soft. Bowel sounds are normal. He exhibits no distension and no mass. There is no tenderness. There is no rebound and no guarding.  Musculoskeletal: He exhibits no edema.  Lymphadenopathy:    He has no cervical adenopathy.  Neurological: He is alert and oriented to person, place, and time. He displays normal reflexes. No cranial nerve deficit.  Skin: No rash noted.  No concerning skin lesions noted  Psychiatric: He has a normal mood and affect.          Assessment & Plan:   Physical exam. Labs reviewed. Borderline elevated glucose of 100. Other labs stable. We've recommended weight loss and establishing  more consistent exercise. Reduce high glycemic foods such as sweetened tea and colas. Colonoscopy repeat by next year. Continue yearly dermatology skin check with past history of melanoma. Check on coverage for shingles vaccine.  [name] MD [facility]

## 2015-05-25 NOTE — Patient Instructions (Signed)
Continue with yearly dermatology skin checks Check on coverage for shingles vaccine. Try to reduce high sugar and white starch foods.

## 2015-05-25 NOTE — Progress Notes (Signed)
Pre visit review using our clinic review tool, if applicable. No additional management support is needed unless otherwise documented below in the visit note. 

## 2015-05-28 ENCOUNTER — Telehealth: Payer: Self-pay | Admitting: Family Medicine

## 2015-05-28 NOTE — Telephone Encounter (Signed)
Pt had a physical on 05-25-15 and needs on our letterhead  that he had a age appropriate physical.

## 2015-06-01 NOTE — Telephone Encounter (Signed)
Pt call to follow up on letter that he need written on company letter head. He this letter by 06/08/15 .

## 2015-06-01 NOTE — Telephone Encounter (Signed)
Letter mailed to pt and he is aware.

## 2015-06-01 NOTE — Telephone Encounter (Signed)
Wrote letter and printed for signature.

## 2015-07-06 ENCOUNTER — Encounter: Payer: Self-pay | Admitting: Family Medicine

## 2015-07-06 ENCOUNTER — Ambulatory Visit (INDEPENDENT_AMBULATORY_CARE_PROVIDER_SITE_OTHER): Payer: Commercial Managed Care - PPO | Admitting: Family Medicine

## 2015-07-06 VITALS — BP 122/80 | HR 65 | Temp 98.4°F | Ht 66.0 in | Wt 173.0 lb

## 2015-07-06 DIAGNOSIS — J069 Acute upper respiratory infection, unspecified: Secondary | ICD-10-CM | POA: Diagnosis not present

## 2015-07-06 DIAGNOSIS — B9789 Other viral agents as the cause of diseases classified elsewhere: Principal | ICD-10-CM

## 2015-07-06 NOTE — Progress Notes (Signed)
   Subjective:    Patient ID: Dennis Villarreal, male    DOB: April 20, 1950, 65 y.o.   MRN: [id number]  HPI Patient seen with acute upper respiratory symptoms Onset Saturday of sore throat. He's had some postnasal drainage. Denies any myalgias. Increased malaise. No fever or chills. No sick contacts. Took over-the-counter decongestant with some relief yesterday but less today. Denies any purulent secretions.  No headaches. Mild laryngitis symptoms-past couple of days  Past Medical History  Diagnosis Date  . DYSLIPIDEMIA 09/04/2009  . ALLERGIC RHINITIS 09/04/2009  . GERD 09/04/2009  . Cancer ([place]) 2012    melanoma  . High cholesterol     just above borderline per patient    Past Surgical History  Procedure Laterality Date  . Tonsillectomy    . Shoulder surgery  1986, 2009    left shoulder scromoplasty    reports that he quit smoking about 33 years ago. His smoking use included Cigarettes. He has a 20 pack-year smoking history. He has never used smokeless tobacco. He reports that he drinks alcohol. He reports that he does not use illicit drugs. family history includes Glaucoma in his brother and father; Heart disease in his mother; Hypertension in his mother; Melanoma in his father; Stroke in his mother. Allergies  Allergen Reactions  . Levofloxacin     REACTION: hives and GI upset  . Other Hives    levaquin      Review of Systems  Constitutional: Positive for fatigue.  HENT: Positive for congestion, sore throat and voice change.   Respiratory: Positive for cough.        Objective:   Physical Exam  Constitutional: He appears well-developed and well-nourished.  HENT:  Right Ear: External ear normal.  Left Ear: External ear normal.  Mouth/Throat: Oropharynx is clear and moist.  Neck: Neck supple.  Cardiovascular: Normal rate and regular rhythm.   Pulmonary/Chest: Effort normal and breath sounds normal. No respiratory distress. He has no wheezes. He has no rales.    Lymphadenopathy:    He has no cervical adenopathy.          Assessment & Plan:  Probable viral URI with cough. Continue over-the-counter decongestant as needed. Stay well-hydrated. Follow-up if symptoms persist or worsen.  [name] MD  Primary Care at [facility]

## 2015-07-06 NOTE — Progress Notes (Signed)
Pre visit review using our clinic review tool, if applicable. No additional management support is needed unless otherwise documented below in the visit note. 

## 2015-07-06 NOTE — Patient Instructions (Signed)
Upper Respiratory Infection, Adult Most upper respiratory infections (URIs) are a viral infection of the air passages leading to the lungs. A URI affects the nose, throat, and upper air passages. The most common type of URI is nasopharyngitis and is typically referred to as "the common cold." URIs run their course and usually go away on their own. Most of the time, a URI does not require medical attention, but sometimes a bacterial infection in the upper airways can follow a viral infection. This is called a secondary infection. Sinus and middle ear infections are common types of secondary upper respiratory infections. Bacterial pneumonia can also complicate a URI. A URI can worsen asthma and chronic obstructive pulmonary disease (COPD). Sometimes, these complications can require emergency medical care and may be life threatening.  CAUSES Almost all URIs are caused by viruses. A virus is a type of germ and can spread from one person to another.  RISKS FACTORS You may be at risk for a URI if:   You smoke.   You have chronic heart or lung disease.  You have a weakened defense (immune) system.   You are very young or very old.   You have nasal allergies or asthma.  You work in crowded or poorly ventilated areas.  You work in health care facilities or schools. SIGNS AND SYMPTOMS  Symptoms typically develop 2-3 days after you come in contact with a cold virus. Most viral URIs last 7-10 days. However, viral URIs from the influenza virus (flu virus) can last 14-18 days and are typically more severe. Symptoms may include:   Runny or stuffy (congested) nose.   Sneezing.   Cough.   Sore throat.   Headache.   Fatigue.   Fever.   Loss of appetite.   Pain in your forehead, behind your eyes, and over your cheekbones (sinus pain).  Muscle aches.  DIAGNOSIS  Your health care provider may diagnose a URI by:  Physical exam.  Tests to check that your symptoms are not due to  another condition such as:  Strep throat.  Sinusitis.  Pneumonia.  Asthma. TREATMENT  A URI goes away on its own with time. It cannot be cured with medicines, but medicines may be prescribed or recommended to relieve symptoms. Medicines may help:  Reduce your fever.  Reduce your cough.  Relieve nasal congestion. HOME CARE INSTRUCTIONS   Take medicines only as directed by your health care provider.   Gargle warm saltwater or take cough drops to comfort your throat as directed by your health care provider.  Use a warm mist humidifier or inhale steam from a shower to increase air moisture. This may make it easier to breathe.  Drink enough fluid to keep your urine clear or pale yellow.   Eat soups and other clear broths and maintain good nutrition.   Rest as needed.   Return to work when your temperature has returned to normal or as your health care provider advises. You may need to stay home longer to avoid infecting others. You can also use a face mask and careful hand washing to prevent spread of the virus.  Increase the usage of your inhaler if you have asthma.   Do not use any tobacco products, including cigarettes, chewing tobacco, or electronic cigarettes. If you need help quitting, ask your health care provider. PREVENTION  The best way to protect yourself from getting a cold is to practice good hygiene.   Avoid oral or hand contact with people with cold   symptoms.   Wash your hands often if contact occurs.  There is no clear evidence that vitamin C, vitamin E, echinacea, or exercise reduces the chance of developing a cold. However, it is always recommended to get plenty of rest, exercise, and practice good nutrition.  SEEK MEDICAL CARE IF:   You are getting worse rather than better.   Your symptoms are not controlled by medicine.   You have chills.  You have worsening shortness of breath.  You have brown or red mucus.  You have yellow or brown nasal  discharge.  You have pain in your face, especially when you bend forward.  You have a fever.  You have swollen neck glands.  You have pain while swallowing.  You have white areas in the back of your throat. SEEK IMMEDIATE MEDICAL CARE IF:   You have severe or persistent:  Headache.  Ear pain.  Sinus pain.  Chest pain.  You have chronic lung disease and any of the following:  Wheezing.  Prolonged cough.  Coughing up blood.  A change in your usual mucus.  You have a stiff neck.  You have changes in your:  Vision.  Hearing.  Thinking.  Mood. MAKE SURE YOU:   Understand these instructions.  Will watch your condition.  Will get help right away if you are not doing well or get worse.   This information is not intended to replace advice given to you by your health care provider. Make sure you discuss any questions you have with your health care provider.   Document Released: 07/06/2000 Document Revised: 05/27/2014 Document Reviewed: 04/17/2013 Elsevier Interactive Patient Education 2016 Elsevier Inc.  

## 2015-07-10 ENCOUNTER — Telehealth: Payer: Self-pay | Admitting: Family Medicine

## 2015-07-10 MED ORDER — HYDROCODONE-HOMATROPINE 5-1.5 MG/5ML PO SYRP: 5.0000 mL | ORAL_SOLUTION | Freq: Three times a day (TID) | ORAL | Status: DC | PRN

## 2015-07-10 NOTE — Telephone Encounter (Signed)
Hycodan one tsp po q 6 hours prn cough #120 ml

## 2015-07-10 NOTE — Telephone Encounter (Signed)
Pt seen this past Monday for upper respiratory infection. Pt is not much better and has developed a bad cough. Pt cannot sleep due to coughing.  Pt  had to come from from work because   he was coughing so much. Pt states Dr Elease Hashimoto has prescribed  HYDROcodone-homatropine (HYCODAN) 5-1.5 MG/5ML syrup    in the past which helps and hopes he can get a refill of this.

## 2015-07-10 NOTE — Telephone Encounter (Signed)
Rx ready to be picked up, LVM notifying pt okay to pick up

## 2015-09-15 ENCOUNTER — Other Ambulatory Visit: Payer: Self-pay | Admitting: Family Medicine

## 2016-03-14 ENCOUNTER — Encounter: Payer: Self-pay | Admitting: Family Medicine

## 2016-03-14 ENCOUNTER — Ambulatory Visit: Payer: Commercial Managed Care - PPO | Admitting: Family Medicine

## 2016-03-14 ENCOUNTER — Ambulatory Visit (INDEPENDENT_AMBULATORY_CARE_PROVIDER_SITE_OTHER): Payer: Commercial Managed Care - PPO | Admitting: Family Medicine

## 2016-03-14 VITALS — BP 120/80 | HR 85 | Ht 66.0 in | Wt 172.8 lb

## 2016-03-14 DIAGNOSIS — K644 Residual hemorrhoidal skin tags: Secondary | ICD-10-CM

## 2016-03-14 MED ORDER — HYDROCORTISONE ACE-PRAMOXINE 1-1 % RE FOAM: 1.0000 | Freq: Two times a day (BID) | RECTAL | 2 refills | Status: DC

## 2016-03-14 NOTE — Progress Notes (Signed)
Pre visit review using our clinic review tool, if applicable. No additional management support is needed unless otherwise documented below in the visit note. 

## 2016-03-14 NOTE — Progress Notes (Signed)
Subjective:     Patient ID: Dennis Villarreal, male   DOB: 14-Jul-1950, 65 y.o.   MRN: [id number]  HPI Patient seen with possible external hemorrhoids. He states he's had these in the past. Last week he had some nausea without vomiting and constipation. He has long history of intermittent constipation and takes polyethylene glycol intermittently for that. He had to strain some last week and noticed last Thursday onset of perianal pain and swelling typical prior hemorrhoids. About a year ago he had what sounded like a thrombosed hemorrhoid and went to urgent care and that was lanced. He's tried some Preparation H over-the-counter cream without much improvement. He had colonoscopy out 10 years ago and is in process of setting up repeat. Denies history of anal fissure.  Past Medical History:  Diagnosis Date  . ALLERGIC RHINITIS 09/04/2009  . Cancer ([place]) 2012   melanoma  . DYSLIPIDEMIA 09/04/2009  . GERD 09/04/2009  . High cholesterol    just above borderline per patient    Past Surgical History:  Procedure Laterality Date  . [place], 2009   left shoulder scromoplasty  . TONSILLECTOMY      reports that he quit smoking about 34 years ago. His smoking use included Cigarettes. He has a 20.00 pack-year smoking history. He has never used smokeless tobacco. He reports that he drinks alcohol. He reports that he does not use drugs. family history includes Glaucoma in his brother and father; Heart disease in his mother; Hypertension in his mother; Melanoma in his father; Stroke in his mother. Allergies  Allergen Reactions  . Levofloxacin     REACTION: hives and GI upset  . Other Hives    levaquin     Review of Systems  Constitutional: Negative for chills and fever.  Gastrointestinal: Positive for constipation and rectal pain. Negative for abdominal distention, abdominal pain, diarrhea and vomiting.       Objective:   Physical Exam  Constitutional: He appears well-developed and  well-nourished.  Cardiovascular: Normal rate and regular rhythm.   Pulmonary/Chest: Effort normal and breath sounds normal. No respiratory distress. He has no wheezes. He has no rales.  Genitourinary:  Genitourinary Comments: Patient has some perianal skin tags and external hemorrhoids. No acute thrombosis. No visible anal fissure. No evidence for perianal abscess       Assessment:     External hemorrhoids. No acute thrombosis    Plan:     -Discussed dietary measures to reduce constipation -Consider warm sitz baths twice daily -Proctofoam HC used twice daily as needed  [name] MD [place] [facility] at [facility]

## 2016-03-14 NOTE — Patient Instructions (Signed)
Hemorrhoids Hemorrhoids are swollen veins in and around the rectum or anus. There are two types of hemorrhoids:  Internal hemorrhoids. These occur in the veins that are just inside the rectum. They may poke through to the outside and become irritated and painful.  External hemorrhoids. These occur in the veins that are outside of the anus and can be felt as a painful swelling or hard lump near the anus.  Most hemorrhoids do not cause serious problems, and they can be managed with home treatments such as diet and lifestyle changes. If home treatments do not help your symptoms, procedures can be done to shrink or remove the hemorrhoids. What are the causes? This condition is caused by increased pressure in the anal area. This pressure may result from various things, including:  Constipation.  Straining to have a bowel movement.  Diarrhea.  Pregnancy.  Obesity.  Sitting for long periods of time.  Heavy lifting or other activity that causes you to strain.  Anal sex.  What are the signs or symptoms? Symptoms of this condition include:  Pain.  Anal itching or irritation.  Rectal bleeding.  Leakage of stool (feces).  Anal swelling.  One or more lumps around the anus.  How is this diagnosed? This condition can often be diagnosed through a visual exam. Other exams or tests may also be done, such as:  Examination of the rectal area with a gloved hand (digital rectal exam).  Examination of the anal canal using a small tube (anoscope).  A blood test, if you have lost a significant amount of blood.  A test to look inside the colon (sigmoidoscopy or colonoscopy).  How is this treated? This condition can usually be treated at home. However, various procedures may be done if dietary changes, lifestyle changes, and other home treatments do not help your symptoms. These procedures can help make the hemorrhoids smaller or remove them completely. Some of these procedures involve  surgery, and others do not. Common procedures include:  Rubber band ligation. Rubber bands are placed at the base of the hemorrhoids to cut off the blood supply to them.  Sclerotherapy. Medicine is injected into the hemorrhoids to shrink them.  Infrared coagulation. A type of light energy is used to get rid of the hemorrhoids.  Hemorrhoidectomy surgery. The hemorrhoids are surgically removed, and the veins that supply them are tied off.  Stapled hemorrhoidopexy surgery. A circular stapling device is used to remove the hemorrhoids and use staples to cut off the blood supply to them.  Follow these instructions at home: Eating and drinking  Eat foods that have a lot of fiber in them, such as whole grains, beans, nuts, fruits, and vegetables. Ask your health care provider about taking products that have added fiber (fiber supplements).  Drink enough fluid to keep your urine clear or pale yellow. Managing pain and swelling  Take warm sitz baths for 20 minutes, 3-4 times a day to ease pain and discomfort.  If directed, apply ice to the affected area. Using ice packs between sitz baths may be helpful. ? Put ice in a plastic bag. ? Place a towel between your skin and the bag. ? Leave the ice on for 20 minutes, 2-3 times a day. General instructions  Take over-the-counter and prescription medicines only as told by your health care provider.  Use medicated creams or suppositories as told.  Exercise regularly.  Go to the bathroom when you have the urge to have a bowel movement. Do not wait.    Avoid straining to have bowel movements.  Keep the anal area dry and clean. Use wet toilet paper or moist towelettes after a bowel movement.  Do not sit on the toilet for long periods of time. This increases blood pooling and pain. Contact a health care provider if:  You have increasing pain and swelling that are not controlled by treatment or medicine.  You have uncontrolled bleeding.  You  have difficulty having a bowel movement, or you are unable to have a bowel movement.  You have pain or inflammation outside the area of the hemorrhoids. This information is not intended to replace advice given to you by your health care provider. Make sure you discuss any questions you have with your health care provider. Document Released: 01/08/2000 Document Revised: 06/10/2015 Document Reviewed: 09/24/2014 Elsevier Interactive Patient Education  2017 Elsevier Inc.  

## 2016-03-29 ENCOUNTER — Other Ambulatory Visit: Payer: Self-pay | Admitting: Family Medicine

## 2016-06-03 ENCOUNTER — Ambulatory Visit (INDEPENDENT_AMBULATORY_CARE_PROVIDER_SITE_OTHER): Payer: Commercial Managed Care - PPO | Admitting: Family Medicine

## 2016-06-03 ENCOUNTER — Encounter: Payer: Self-pay | Admitting: Family Medicine

## 2016-06-03 VITALS — BP 120/78 | HR 66 | Temp 98.6°F | Wt 171.8 lb

## 2016-06-03 DIAGNOSIS — M65311 Trigger thumb, right thumb: Secondary | ICD-10-CM | POA: Diagnosis not present

## 2016-06-03 MED ORDER — DICLOFENAC SODIUM 3 % TD GEL: 1.0000 "application " | Freq: Four times a day (QID) | TRANSDERMAL | 1 refills | Status: DC | PRN

## 2016-06-03 NOTE — Progress Notes (Signed)
Subjective:     Patient ID: Dennis Villarreal, male   DOB: [birth year], 66 y.o.   MRN: [id number]  HPI Patient's seen with right thumb pain for the past 6 weeks or so. He does a lot of work on the computer. He denies any wrist pain or involvement other digits. No injury. He has some pain mostly down along the volar aspect of thumb to the base. He noticed a tender nodule there. He has occasional care thumb symptoms. He denies any joint pain. No weakness. No numbness. Pain is moderate in intensity  Past Medical History:  Diagnosis Date  . ALLERGIC RHINITIS 09/04/2009  . Cancer ([place]) 2012   melanoma  . DYSLIPIDEMIA 09/04/2009  . GERD 09/04/2009  . High cholesterol    just above borderline per patient    Past Surgical History:  Procedure Laterality Date  . [place], 2009   left shoulder scromoplasty  . TONSILLECTOMY      reports that he quit smoking about 34 years ago. His smoking use included Cigarettes. He has a 20.00 pack-year smoking history. He has never used smokeless tobacco. He reports that he drinks alcohol. He reports that he does not use drugs. family history includes Glaucoma in his brother and father; Heart disease in his mother; Hypertension in his mother; Melanoma in his father; Stroke in his mother. Allergies  Allergen Reactions  . Levofloxacin     REACTION: hives and GI upset  . Other Hives    levaquin     Review of Systems  Neurological: Negative for weakness and numbness.  Hematological: Negative for adenopathy. Does not bruise/bleed easily.       Objective:   Physical Exam  Constitutional: He appears well-developed and well-nourished.  Cardiovascular: Normal rate and regular rhythm.   Musculoskeletal:  Right thumb-no visible edema or ecchymosis. He has no joint tenderness. Full range of motion. He has a tender nodule on the volar surface down near the base of the thumb.       Assessment:     Right trigger thumb.    Plan:     -We reviewed  options including steroid injection and at this point he wishes to observe. -He will try some diclofenac 0.3% gel 3-4 times daily as needed. -Follow-up for persistent or worsening symptoms  [name] MD [place] [facility] at [facility]

## 2016-06-03 NOTE — Patient Instructions (Signed)
Trigger Finger Trigger finger (stenosing tenosynovitis) is a condition that causes a finger to get stuck in a bent position. Each finger has a tough, cord-like tissue that connects muscle to bone (tendon), and each tendon is surrounded by a tunnel of tissue (tendon sheath). To move your finger, your tendon needs to slide freely through the sheath. Trigger finger happens when the tendon or the sheath thickens, making it difficult to move your finger. Trigger finger can affect any finger or a thumb. It may affect more than one finger. Mild cases may clear up with rest and medicine. Severe cases require more treatment. What are the causes? Trigger finger is caused by a thickened finger tendon or tendon sheath. The cause of this thickening is not known. What increases the risk? The following factors may make you more likely to develop this condition:  Doing activities that require a strong grip.  Having rheumatoid arthritis, gout, or diabetes.  Being 40-60 years old.  Being a woman.  What are the signs or symptoms? Symptoms of this condition include:  Pain when bending or straightening your finger.  Tenderness or swelling where your finger attaches to the palm of your hand.  A lump in the palm of your hand or on the inside of your finger.  Hearing a popping sound when you try to straighten your finger.  Feeling a popping, catching, or locking sensation when you try to straighten your finger.  Being unable to straighten your finger.  How is this diagnosed? This condition is diagnosed based on your symptoms and a physical exam. How is this treated? This condition may be treated by:  Resting your finger and avoiding activities that make symptoms worse.  Wearing a finger splint to keep your finger in a slightly bent position.  Taking NSAIDs to relieve pain and swelling.  Injecting medicine (steroids) into the tendon sheath to reduce swelling and irritation. Injections may need to be  repeated.  Having surgery to open the tendon sheath. This may be done if other treatments do not work and you cannot straighten your finger. You may need physical therapy after surgery.  Follow these instructions at home:  Use moist heat to help reduce pain and swelling as told by your health care provider.  Rest your finger and avoid activities that make pain worse. Return to normal activities as told by your health care provider.  If you have a splint, wear it as told by your health care provider.  Take over-the-counter and prescription medicines only as told by your health care provider.  Keep all follow-up visits as told by your health care provider. This is important. Contact a health care provider if:  Your symptoms are not improving with home care. Summary  Trigger finger (stenosing tenosynovitis) causes your finger to get stuck in a bent position, and it can make it difficult and painful to straighten your finger.  This condition develops when a finger tendon or tendon sheath thickens.  Treatment starts with resting, wearing a splint, and taking NSAIDs.  In severe cases, surgery to open the tendon sheath may be needed. This information is not intended to replace advice given to you by your health care provider. Make sure you discuss any questions you have with your health care provider. Document Released: 10/31/2003 Document Revised: 12/22/2015 Document Reviewed: 12/22/2015 Elsevier Interactive Patient Education  2017 Elsevier Inc.  

## 2016-06-06 ENCOUNTER — Telehealth: Payer: Self-pay

## 2016-06-06 NOTE — Telephone Encounter (Signed)
Received PA request for Diclofenac. PA submitted & is pending. Key: [id number]

## 2016-06-08 NOTE — Telephone Encounter (Signed)
Let him know insurance denied.

## 2016-06-08 NOTE — Telephone Encounter (Signed)
PA denied, coverage is provided for Actinic keratoses, actinic cheilitis, Bowen's disease, and Disseminated superficial actinic porokeratosis.

## 2016-06-09 NOTE — Telephone Encounter (Signed)
Left voicemail letting patient know PA was denied, and that he could pay out of pocket or keep observing the thumb as discussed during the office visit.

## 2016-07-15 ENCOUNTER — Other Ambulatory Visit: Payer: Self-pay | Admitting: Family Medicine

## 2016-09-01 ENCOUNTER — Other Ambulatory Visit: Payer: Self-pay | Admitting: Family Medicine

## 2016-10-02 ENCOUNTER — Other Ambulatory Visit: Payer: Self-pay | Admitting: Family Medicine

## 2017-01-16 ENCOUNTER — Ambulatory Visit (INDEPENDENT_AMBULATORY_CARE_PROVIDER_SITE_OTHER): Payer: BLUE CROSS/BLUE SHIELD | Admitting: Family Medicine

## 2017-01-16 ENCOUNTER — Encounter: Payer: Self-pay | Admitting: Family Medicine

## 2017-01-16 VITALS — BP 120/72 | HR 69 | Temp 98.5°F | Ht 66.25 in | Wt 171.4 lb

## 2017-01-16 DIAGNOSIS — Z0000 Encounter for general adult medical examination without abnormal findings: Secondary | ICD-10-CM

## 2017-01-16 DIAGNOSIS — Z125 Encounter for screening for malignant neoplasm of prostate: Secondary | ICD-10-CM | POA: Diagnosis not present

## 2017-01-16 DIAGNOSIS — Z23 Encounter for immunization: Secondary | ICD-10-CM | POA: Diagnosis not present

## 2017-01-16 LAB — BASIC METABOLIC PANEL
BUN: 19 mg/dL (ref 6–23)
CHLORIDE: 106 meq/L (ref 96–112)
CO2: 28 mEq/L (ref 19–32)
Calcium: 8.7 mg/dL (ref 8.4–10.5)
Creatinine, Ser: 0.82 mg/dL (ref 0.40–1.50)
GFR: 99.81 mL/min (ref >60.00)
GLUCOSE: 93 mg/dL (ref 70–99)
Potassium: 4.2 mEq/L (ref 3.5–5.1)
Sodium: 141 mEq/L (ref 135–145)

## 2017-01-16 LAB — LIPID PANEL
CHOLESTEROL: 156 mg/dL (ref 0–200)
HDL: 42 mg/dL (ref >39.00)
LDL CALC: 95 mg/dL (ref 0–99)
NonHDL: 113.72
Total CHOL/HDL Ratio: 4
Triglycerides: 92 mg/dL (ref 0.0–149.0)
VLDL: 18.4 mg/dL (ref 0.0–40.0)

## 2017-01-16 LAB — HEPATIC FUNCTION PANEL
ALBUMIN: 4.3 g/dL (ref 3.5–5.2)
ALT: 16 U/L (ref 0–53)
AST: 24 U/L (ref 0–37)
Alkaline Phosphatase: 59 U/L (ref 39–117)
BILIRUBIN TOTAL: 1 mg/dL (ref 0.2–1.2)
Bilirubin, Direct: 0.2 mg/dL (ref 0.0–0.3)
Total Protein: 6.8 g/dL (ref 6.0–8.3)

## 2017-01-16 LAB — CBC WITH DIFFERENTIAL/PLATELET
BASOS PCT: 0.2 % (ref 0.0–3.0)
Basophils Absolute: 0 10*3/uL (ref 0.0–0.1)
EOS ABS: 0.1 10*3/uL (ref 0.0–0.7)
Eosinophils Relative: 2.1 % (ref 0.0–5.0)
HCT: 43.2 % (ref 39.0–52.0)
HEMOGLOBIN: 14.8 g/dL (ref 13.0–17.0)
LYMPHS ABS: 1.7 10*3/uL (ref 0.7–4.0)
Lymphocytes Relative: 34.5 % (ref 12.0–46.0)
MCHC: 34.3 g/dL (ref 30.0–36.0)
MCV: 93.7 fl (ref 78.0–100.0)
MONO ABS: 0.4 10*3/uL (ref 0.1–1.0)
Monocytes Relative: 7.5 % (ref 3.0–12.0)
NEUTROS PCT: 55.7 % (ref 43.0–77.0)
Neutro Abs: 2.8 10*3/uL (ref 1.4–7.7)
PLATELETS: 169 10*3/uL (ref 150.0–400.0)
RBC: 4.61 Mil/uL (ref 4.22–5.81)
RDW: 12.5 % (ref 11.5–15.5)
WBC: 5 10*3/uL (ref 4.0–10.5)

## 2017-01-16 LAB — TSH: TSH: 1.25 u[IU]/mL (ref 0.35–4.50)

## 2017-01-16 LAB — PSA: PSA: 1.59 ng/mL (ref 0.10–4.00)

## 2017-01-16 MED ORDER — TAMSULOSIN HCL 0.4 MG PO CAPS: 0.4000 mg | ORAL_CAPSULE | Freq: Every day | ORAL | 11 refills | Status: DC

## 2017-01-16 NOTE — Progress Notes (Signed)
Subjective:     Patient ID: Dennis Villarreal, male   DOB: May 24, 1950, 66 y.o.   MRN: [id number]  HPI Patient is here for physical exam. Problems include history of GERD, past history of depression, hyperlipidemia and remote history of melanoma. He sees dermatologist yearly for skin checks. Lost his job during past year but is already found another job and is working well. No history of shingles vaccine. Patient smoked less than 15-pack-year history back in his 44s and early 83s. No history of abdominal aortic aneurysm screening. Also needs Prevnar 13. He is due for repeat colonoscopy. No history of documented hepatitis C screening.  Had some progressive slow stream and nocturia over the past year or 2. No burning with urination.  Past Medical History:  Diagnosis Date  . ALLERGIC RHINITIS 09/04/2009  . Cancer ([place]) 2012   melanoma  . DYSLIPIDEMIA 09/04/2009  . GERD 09/04/2009  . High cholesterol    just above borderline per patient    Past Surgical History:  Procedure Laterality Date  . [place], 2009   left shoulder scromoplasty  . TONSILLECTOMY      reports that he quit smoking about 35 years ago. His smoking use included cigarettes. He has a 20.00 pack-year smoking history. he has never used smokeless tobacco. He reports that he drinks alcohol. He reports that he does not use drugs. family history includes Glaucoma in his brother and father; Heart disease in his mother; Hypertension in his mother; Melanoma in his father; Stroke in his mother. Allergies  Allergen Reactions  . Levofloxacin     REACTION: hives and GI upset  . Other Hives    levaquin     Review of Systems  Constitutional: Negative for activity change, appetite change, fatigue and fever.  HENT: Negative for congestion, ear pain and trouble swallowing.   Eyes: Negative for pain and visual disturbance.  Respiratory: Negative for cough, shortness of breath and wheezing.   Cardiovascular: Negative for chest  pain and palpitations.  Gastrointestinal: Negative for abdominal distention, abdominal pain, blood in stool, constipation, diarrhea, nausea, rectal pain and vomiting.  Genitourinary: Positive for decreased urine volume and difficulty urinating. Negative for dysuria, hematuria and testicular pain.  Musculoskeletal: Negative for arthralgias and joint swelling.  Skin: Negative for rash.  Neurological: Negative for dizziness, syncope and headaches.  Hematological: Negative for adenopathy.  Psychiatric/Behavioral: Negative for confusion and dysphoric mood.       Objective:   Physical Exam  Constitutional: He is oriented to person, place, and time. He appears well-developed and well-nourished. No distress.  HENT:  Head: Normocephalic and atraumatic.  Right Ear: External ear normal.  Left Ear: External ear normal.  Mouth/Throat: Oropharynx is clear and moist.  Eyes: Conjunctivae and EOM are normal. Pupils are equal, round, and reactive to light.  Neck: Normal range of motion. Neck supple. No thyromegaly present.  Cardiovascular: Normal rate, regular rhythm and normal heart sounds.  No murmur heard. Pulmonary/Chest: No respiratory distress. He has no wheezes. He has no rales.  Abdominal: Soft. Bowel sounds are normal. He exhibits no distension and no mass. There is no tenderness. There is no rebound and no guarding.  Musculoskeletal: He exhibits no edema.  Lymphadenopathy:    He has no cervical adenopathy.  Neurological: He is alert and oriented to person, place, and time. He displays normal reflexes. No cranial nerve deficit.  Skin: No rash noted.  Psychiatric: He has a normal mood and affect.  PH Q-9 score 5  Assessment:     Physical exam. Several issues addressed as below.  He describes some progressive slow stream and BPH symptoms    Plan:     -Flu vaccine already given -Recommend Prevnar 13 and then Pneumovax in 1 year -Check on coverage for abdominal aortic aneurysm  screen -Consider shingles vaccine and if interested check on coverage -Set up repeat colonoscopy for next year -Obtain screening labs including hepatitis C antibody. -The natural history of prostate cancer and ongoing controversy regarding screening and potential treatment outcomes of prostate cancer has been discussed with the patient. The meaning of a false positive PSA and a false negative PSA has been discussed. He indicates understanding of the limitations of this screening test and wishes to proceed with screening PSA testing. -He will continue with yearly dermatology screening for skin cancer -He does not qualify for low-dose CT lung cancer screening -Start Flomax 0.4 mg daily at bedtime and touch base in 2-3 weeks if not seeing improvements  [name] MD [facility] at [facility]

## 2017-01-16 NOTE — Addendum Note (Signed)
Addended by: Agnes Lawrence on: 01/16/2017 09:16 AM   Modules accepted: Orders

## 2017-01-16 NOTE — Patient Instructions (Signed)
Consider new shingles vaccine- Shingrix and check on coverage if interested  Set up repeat colonoscopy  Consider one time abdominal aortic aneurysm screen- check on coverage.    We will give Prevnar 13 today and recommend pneumovax in one year.

## 2017-01-17 LAB — HEPATITIS C ANTIBODY
HEP C AB: NONREACTIVE
SIGNAL TO CUT-OFF: 0.07 (ref <1.00)

## 2017-01-18 ENCOUNTER — Encounter: Payer: Self-pay | Admitting: Family Medicine

## 2017-01-20 DIAGNOSIS — M722 Plantar fascial fibromatosis: Secondary | ICD-10-CM | POA: Diagnosis not present

## 2017-01-20 DIAGNOSIS — M7732 Calcaneal spur, left foot: Secondary | ICD-10-CM | POA: Diagnosis not present

## 2017-01-20 DIAGNOSIS — M71572 Other bursitis, not elsewhere classified, left ankle and foot: Secondary | ICD-10-CM | POA: Diagnosis not present

## 2017-01-20 DIAGNOSIS — M84375A Stress fracture, left foot, initial encounter for fracture: Secondary | ICD-10-CM | POA: Diagnosis not present

## 2017-01-30 DIAGNOSIS — M71572 Other bursitis, not elsewhere classified, left ankle and foot: Secondary | ICD-10-CM | POA: Diagnosis not present

## 2017-01-30 DIAGNOSIS — M722 Plantar fascial fibromatosis: Secondary | ICD-10-CM | POA: Diagnosis not present

## 2017-02-06 DIAGNOSIS — M84375D Stress fracture, left foot, subsequent encounter for fracture with routine healing: Secondary | ICD-10-CM | POA: Diagnosis not present

## 2017-02-06 DIAGNOSIS — M722 Plantar fascial fibromatosis: Secondary | ICD-10-CM | POA: Diagnosis not present

## 2017-02-07 ENCOUNTER — Other Ambulatory Visit: Payer: Self-pay | Admitting: Family Medicine

## 2017-02-13 DIAGNOSIS — M84375D Stress fracture, left foot, subsequent encounter for fracture with routine healing: Secondary | ICD-10-CM | POA: Diagnosis not present

## 2017-02-22 ENCOUNTER — Telehealth: Payer: Self-pay | Admitting: Family Medicine

## 2017-02-22 NOTE — Telephone Encounter (Signed)
Attempted to call patient but voicemail hasn't been set up yet.  Will try again at a different time.

## 2017-02-22 NOTE — Telephone Encounter (Signed)
Copied from [place] [phone]. Topic: Quick Communication - See Telephone Encounter >> Feb 22, 2017  9:08 AM Cleaster Corin, NT wrote: CRM for notification. See Telephone encounter for:   02/22/17. Pt. Calling wanting to talk with Dr. Elease Hashimoto or nurse about med. Flomax he doesn't want to continue to take he said it isn't working and he read about the side effects and doesn't want to continue to take. Pt. Can be reached at [phone]

## 2017-02-22 NOTE — Telephone Encounter (Signed)
Attempted to call patient but voicemail has not been set up yet.  Will try again at a different time.

## 2017-02-24 NOTE — Telephone Encounter (Signed)
Spoke with pt and he states that the Flomax is not working and he would like to change to something else. He mentioned not liking the side-effects. He would like to know if there is another medication you could recommend.  Dr. Elease Hashimoto - Please advise. Thanks!

## 2017-02-25 NOTE — Telephone Encounter (Signed)
Which side effects did he have?  This may help to determine what we might try.

## 2017-02-27 DIAGNOSIS — Z808 Family history of malignant neoplasm of other organs or systems: Secondary | ICD-10-CM | POA: Diagnosis not present

## 2017-02-27 DIAGNOSIS — D225 Melanocytic nevi of trunk: Secondary | ICD-10-CM | POA: Diagnosis not present

## 2017-02-27 DIAGNOSIS — L738 Other specified follicular disorders: Secondary | ICD-10-CM | POA: Diagnosis not present

## 2017-02-27 DIAGNOSIS — Z86018 Personal history of other benign neoplasm: Secondary | ICD-10-CM | POA: Diagnosis not present

## 2017-02-27 DIAGNOSIS — D485 Neoplasm of uncertain behavior of skin: Secondary | ICD-10-CM | POA: Diagnosis not present

## 2017-02-27 DIAGNOSIS — Z85820 Personal history of malignant melanoma of skin: Secondary | ICD-10-CM | POA: Diagnosis not present

## 2017-02-27 NOTE — Telephone Encounter (Signed)
Left message on machine for patient to return our call 

## 2017-02-28 MED ORDER — SILODOSIN 8 MG PO CAPS: 8.0000 mg | ORAL_CAPSULE | Freq: Every day | ORAL | 5 refills | Status: DC

## 2017-02-28 NOTE — Telephone Encounter (Signed)
Left message on machine for patient to return our call 

## 2017-02-28 NOTE — Telephone Encounter (Signed)
May try Rapaflo 8 mg by mouth daily at bedtime-if imaging coverage #30 with 5 refills

## 2017-02-28 NOTE — Telephone Encounter (Signed)
rx sent

## 2017-02-28 NOTE — Telephone Encounter (Signed)
Pt states he has started taking a supplement, prosvent. That seems to be helping.  Pt states he has since stopped the flomax. Pt states side effects were its effecting his impending erections and ejaculations.  pt also states the flomax did not seem to help with his flow at all. No change with its use.  If there is another alternative, pt may be willing to try, the prosvent is $40 Jon Gills

## 2017-03-02 NOTE — Telephone Encounter (Signed)
Pt calling to let Dr. Elease Hashimoto know that he declined filling the silodosin (RAPAFLO)  Due to price. He is taking supplements and will try that for now.

## 2017-03-17 ENCOUNTER — Telehealth: Payer: Self-pay | Admitting: *Deleted

## 2017-03-17 NOTE — Telephone Encounter (Signed)
[facility]  silodosin (RAPAFLO) 8 MG CAPS capsule the insurance co-pay is high.  Tamsulosin and Finastreride are the only alternatives.  Patient is trying a supplement for now.

## 2017-03-17 NOTE — Telephone Encounter (Signed)
He already tried the Flomax.  Continue supplement for now. Given all the incredible complexities with insurance coverage for these meds, would recommend Urology referral for any worsening or persistent symptoms.

## 2017-05-05 DIAGNOSIS — S81801A Unspecified open wound, right lower leg, initial encounter: Secondary | ICD-10-CM | POA: Diagnosis not present

## 2017-05-24 ENCOUNTER — Ambulatory Visit (INDEPENDENT_AMBULATORY_CARE_PROVIDER_SITE_OTHER): Payer: BLUE CROSS/BLUE SHIELD | Admitting: Family Medicine

## 2017-05-24 ENCOUNTER — Encounter: Payer: Self-pay | Admitting: Family Medicine

## 2017-05-24 VITALS — BP 110/70 | HR 83 | Temp 98.1°F | Wt 163.0 lb

## 2017-05-24 DIAGNOSIS — N529 Male erectile dysfunction, unspecified: Secondary | ICD-10-CM

## 2017-05-24 DIAGNOSIS — N400 Enlarged prostate without lower urinary tract symptoms: Secondary | ICD-10-CM | POA: Insufficient documentation

## 2017-05-24 DIAGNOSIS — N401 Enlarged prostate with lower urinary tract symptoms: Secondary | ICD-10-CM

## 2017-05-24 DIAGNOSIS — Z833 Family history of diabetes mellitus: Secondary | ICD-10-CM

## 2017-05-24 MED ORDER — TADALAFIL 5 MG PO TABS: 5.0000 mg | ORAL_TABLET | Freq: Every day | ORAL | 11 refills | Status: DC

## 2017-05-24 NOTE — Progress Notes (Signed)
  Subjective:     Patient ID: Dennis Villarreal, male   DOB: [birth year], 67 y.o.   MRN: [id number]  HPI Patient seen to discuss the following issues.  Has had some recent issues with probable BPH with occasional nocturia and slow stream. He was initially placed on Flomax but had problems with delayed ejaculation and inability to ejaculate. We did try Rapaflo but he states this did not help his symptoms. Current nocturia symptoms are minimal but does have some slow stream. PSA last December was stable 1.59 and unchanged from 2 years ago.  He now has another problem with erectile dysfunction. Would like to explore options. Good libido.  Family history type 2 diabetes in mother and brother. Patient had blood sugar of 93 with most recent labs. He has lost 8 pounds since his yearly physical due to his efforts. He's tried to scale back sugars and starches. He's been limited in activities because of stress fracture left second metatarsal. No polyuria or polydipsia  Past Medical History:  Diagnosis Date  . ALLERGIC RHINITIS 09/04/2009  . Cancer ([place]) 2012   melanoma  . DYSLIPIDEMIA 09/04/2009  . GERD 09/04/2009  . High cholesterol    just above borderline per patient    Past Surgical History:  Procedure Laterality Date  . [place], 2009   left shoulder scromoplasty  . TONSILLECTOMY      reports that he quit smoking about 35 years ago. His smoking use included cigarettes. He has a 20.00 pack-year smoking history. He has never used smokeless tobacco. He reports that he drinks alcohol. He reports that he does not use drugs. family history includes Glaucoma in his brother and father; Heart disease in his mother; Hypertension in his mother; Melanoma in his father; Stroke in his mother. Allergies  Allergen Reactions  . Levofloxacin     REACTION: hives and GI upset  . Other Hives    levaquin     Review of Systems  Constitutional: Negative for fatigue.  Eyes: Negative for visual  disturbance.  Respiratory: Negative for cough, chest tightness and shortness of breath.   Cardiovascular: Negative for chest pain, palpitations and leg swelling.  Neurological: Negative for dizziness, syncope, weakness, light-headedness and headaches.       Objective:   Physical Exam  Constitutional: He appears well-developed and well-nourished.  Cardiovascular: Normal rate, regular rhythm and normal heart sounds.  Pulmonary/Chest: Effort normal and breath sounds normal. He has no rales.  Musculoskeletal: He exhibits no edema.       Assessment:     #1 erectile dysfunction  #2 BPH. Patient had side effect with Flomax as above and Rapaflo was not effective  #3 strong family history type 2 diabetes    Plan:     -Recommend trial of daily Cialis 5 mg to address problems #1 and 2 above -Avoid anticholinergic medications -Discussed prevention of type 2 diabetes. Hopefully can pick up his exercise soon as his stress fracture heals. Discussed dietary factors in prevention of type 2 diabetes  [name] MD [facility] at [facility]

## 2017-05-25 ENCOUNTER — Other Ambulatory Visit: Payer: Self-pay | Admitting: Family Medicine

## 2017-05-25 NOTE — Telephone Encounter (Signed)
Copied from [place] [phone]. Topic: Quick Communication - Rx Refill/Question >> May 25, 2017  3:19 PM Scherrie Gerlach wrote: Medication: tadalafil (CIALIS) 5 MG tablet Has the patient contacted their pharmacy? Yes pt states his insurance is requiring company is requiring a prior on this med. Pt states he was able to get 4 pills yesterday and the good news is that he has taken one and can tell a difference already with his urinary flow.

## 2017-05-26 NOTE — Telephone Encounter (Signed)
Prior auth sent to [id number].

## 2017-05-31 NOTE — Telephone Encounter (Signed)
Spoke with [employer] and answered clinical questions. They will fax determination once complete.

## 2017-06-12 NOTE — Telephone Encounter (Signed)
Fax received from [facility] stating the Rx for Tadalafil 5mg  was approved for 1 tablet daily from 05/26/2017-08/18/2017.  [facility] and informed Sharyn Lull of this.

## 2017-08-06 ENCOUNTER — Other Ambulatory Visit: Payer: Self-pay | Admitting: Family Medicine

## 2017-09-15 DIAGNOSIS — M50322 Other cervical disc degeneration at C5-C6 level: Secondary | ICD-10-CM | POA: Diagnosis not present

## 2017-09-15 DIAGNOSIS — M9902 Segmental and somatic dysfunction of thoracic region: Secondary | ICD-10-CM | POA: Diagnosis not present

## 2017-09-15 DIAGNOSIS — M9901 Segmental and somatic dysfunction of cervical region: Secondary | ICD-10-CM | POA: Diagnosis not present

## 2017-09-15 DIAGNOSIS — M9903 Segmental and somatic dysfunction of lumbar region: Secondary | ICD-10-CM | POA: Diagnosis not present

## 2017-09-25 IMAGING — US US ABDOMEN LIMITED
1 series · 14 of 25 positions shown · non-contrast
Comparison: None.

CLINICAL DATA: Epigastric pain

EXAM:
ULTRASOUND ABDOMEN LIMITED RIGHT UPPER QUADRANT

[Series 1: us abdomen limited · 0.22mm/px · 14 of 62 slices shown]
[im 1/62]
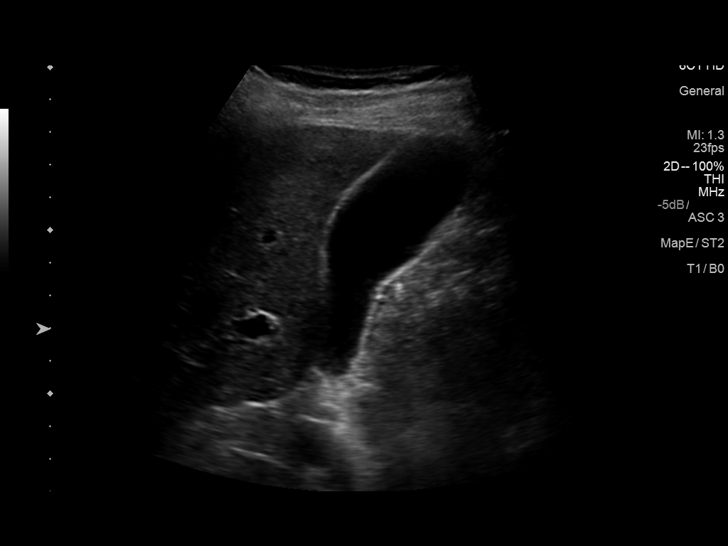
[im 6/62]
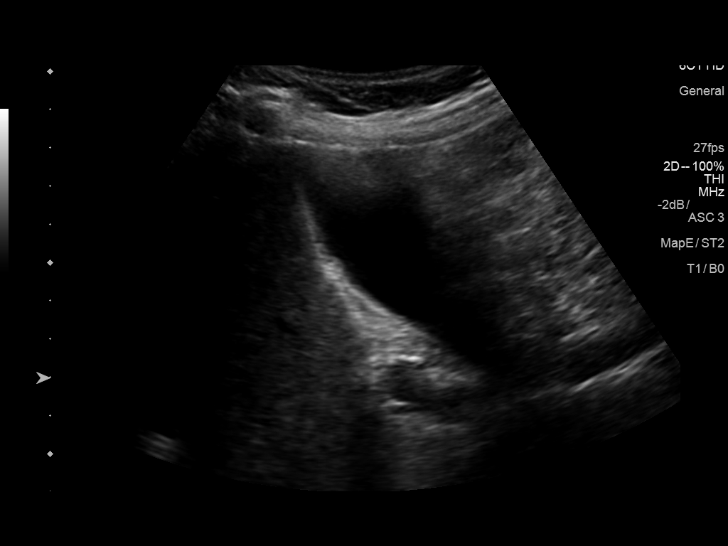
[im 11/62]
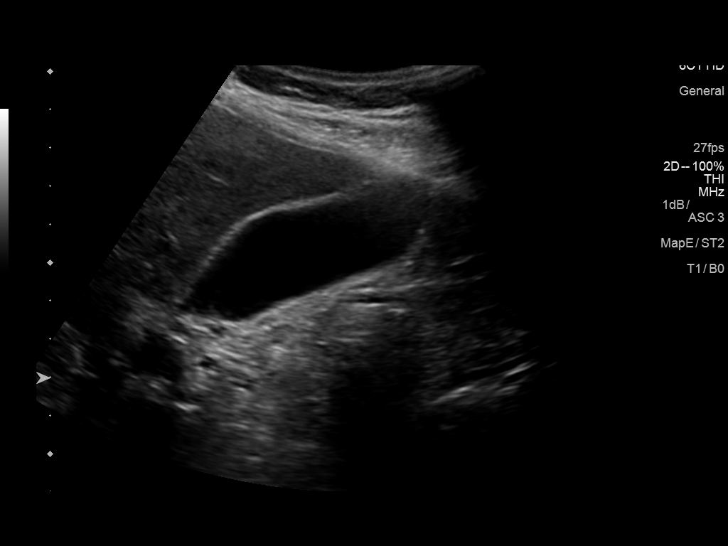
[im 16/62]
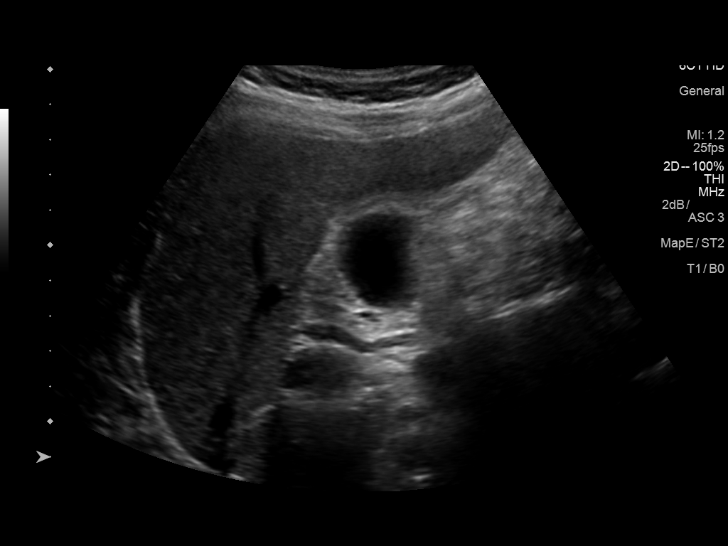
[im 21/62]
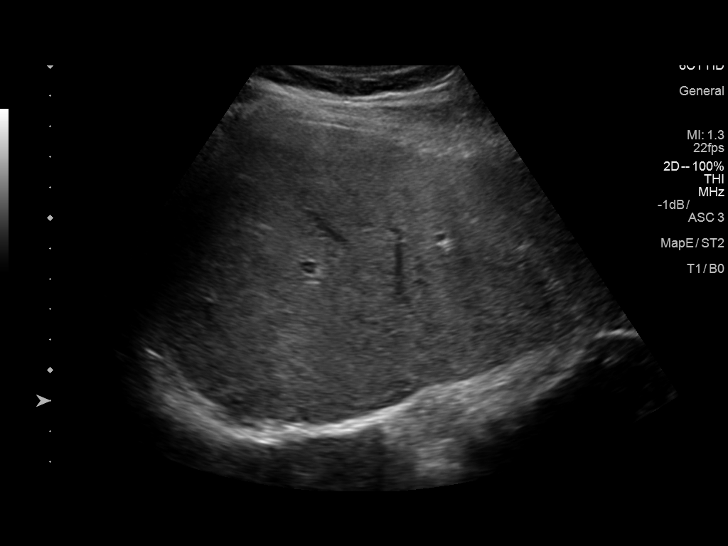
[im 23/62]
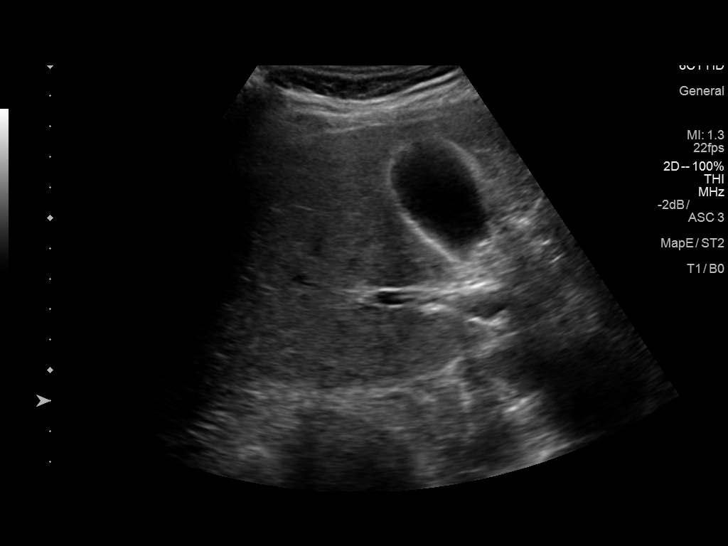
[im 28/62]
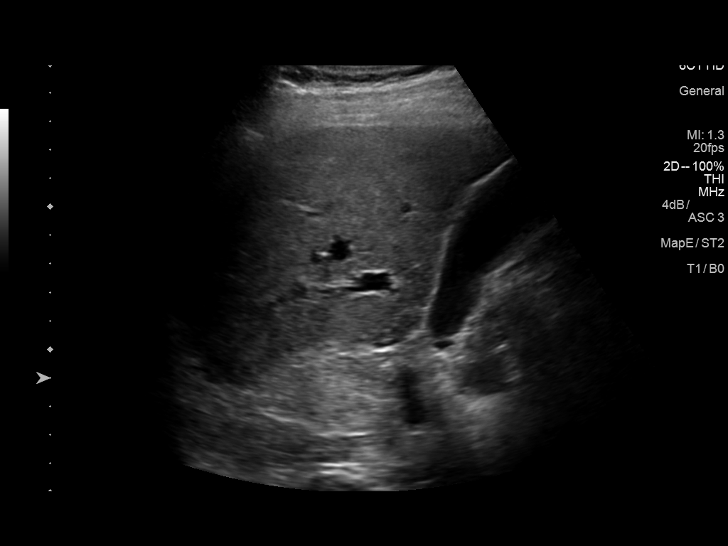
[im 34/62]
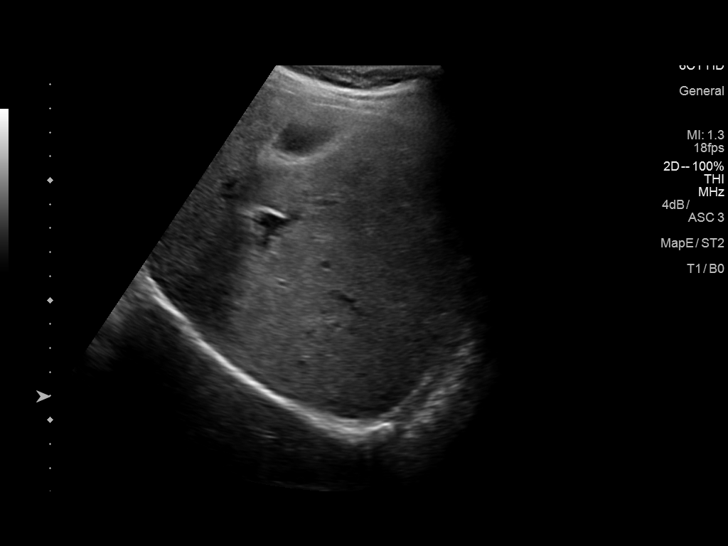
[im 39/62]
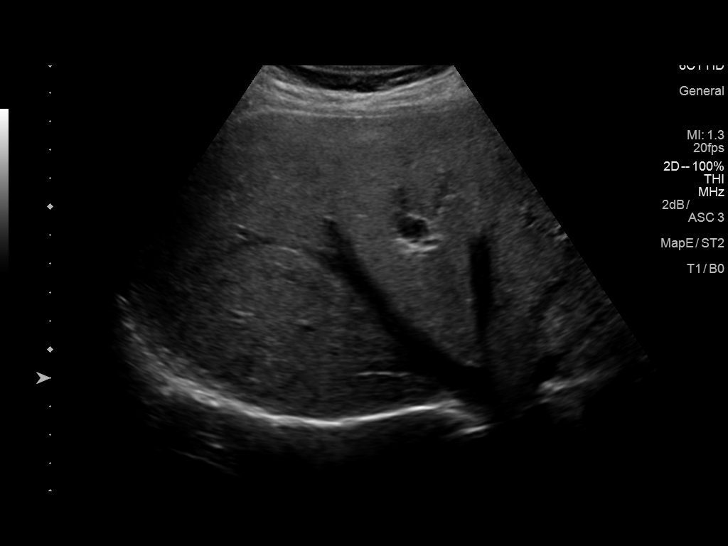
[im 41/62]
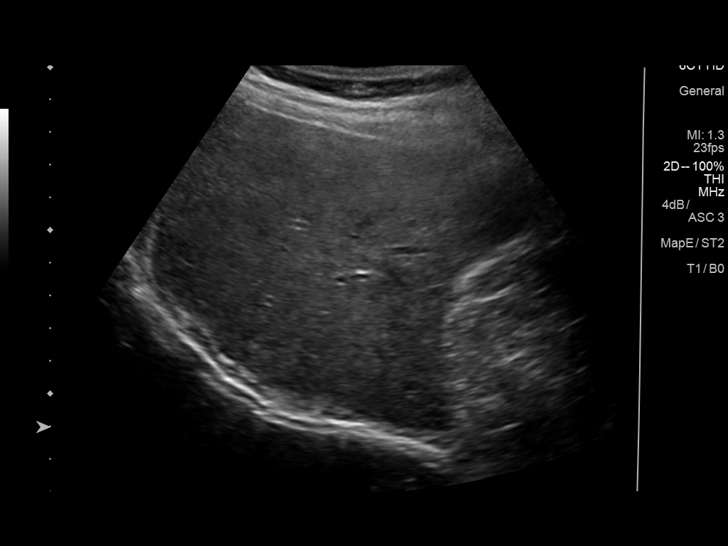
[im 46/62]
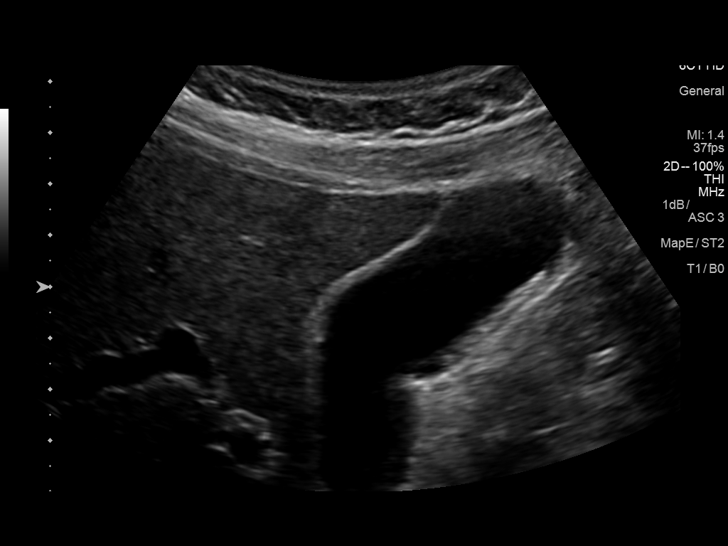
[im 51/62]
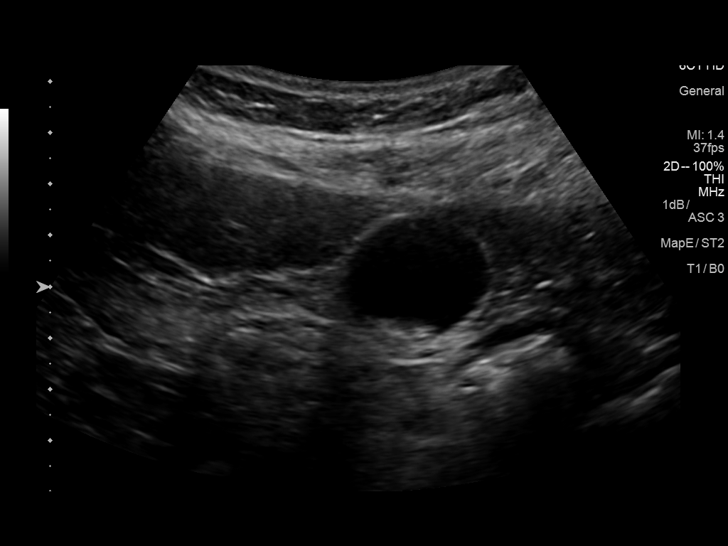
[im 56/62]
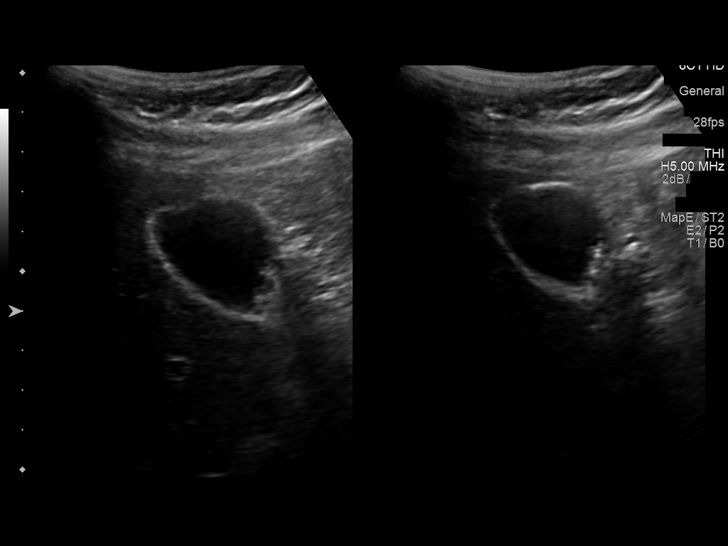
[im 62/62]
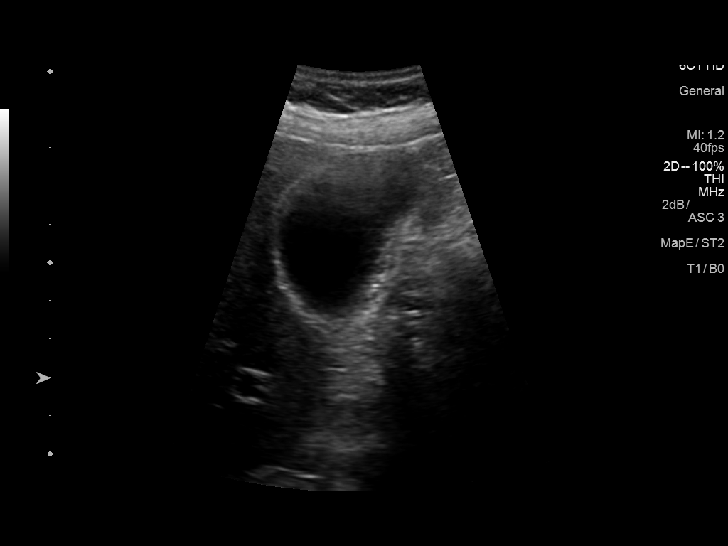

[14 of 25 positions shown; findings below may reference images not displayed]

FINDINGS: Gallbladder:

Tiny echogenic shadowing gallstones noted layering in the
gallbladder. Normal wall thickness measuring 1.6 mm. No Murphy's
sign or pericholecystic fluid. No acute finding.

Common bile duct:

Diameter: 3.7 mm

Liver:

No focal lesion identified. Within normal limits in parenchymal
echogenicity. Portal vein is patent on color Doppler imaging with
normal direction of blood flow towards the liver.
IMPRESSION: Cholelithiasis.  No other acute finding by ultrasound.

## 2017-11-01 DIAGNOSIS — Z86018 Personal history of other benign neoplasm: Secondary | ICD-10-CM | POA: Diagnosis not present

## 2017-11-01 DIAGNOSIS — Z85820 Personal history of malignant melanoma of skin: Secondary | ICD-10-CM | POA: Diagnosis not present

## 2017-11-01 DIAGNOSIS — Z808 Family history of malignant neoplasm of other organs or systems: Secondary | ICD-10-CM | POA: Diagnosis not present

## 2017-11-01 DIAGNOSIS — D225 Melanocytic nevi of trunk: Secondary | ICD-10-CM | POA: Diagnosis not present

## 2018-01-02 ENCOUNTER — Ambulatory Visit (INDEPENDENT_AMBULATORY_CARE_PROVIDER_SITE_OTHER): Payer: BLUE CROSS/BLUE SHIELD | Admitting: Family Medicine

## 2018-01-02 ENCOUNTER — Other Ambulatory Visit: Payer: Self-pay

## 2018-01-02 ENCOUNTER — Encounter: Payer: Self-pay | Admitting: Family Medicine

## 2018-01-02 VITALS — BP 124/72 | HR 63 | Temp 98.3°F | Ht 66.25 in | Wt 166.0 lb

## 2018-01-02 DIAGNOSIS — R252 Cramp and spasm: Secondary | ICD-10-CM

## 2018-01-02 NOTE — Progress Notes (Signed)
  Subjective:     Patient ID: Dennis Villarreal, male   DOB: [birth year], 67 y.o.   MRN: [id number]  HPI Patient is seen with several weeks of more frequent left foot and left calf cramps.  Occasionally has cramping on the right side but mostly left.  Questionable stress fracture left 2nd metatarsal during the past year and has been using a walking boot intermittently but not consistently.  He has no history of peripheral vascular disease and denies any claudication type symptoms.  Symptoms are worse at night.  He thinks he is staying fairly well-hydrated.  Does not take any diuretics.  No upper extremity cramps.  Past Medical History:  Diagnosis Date  . ALLERGIC RHINITIS 09/04/2009  . Cancer ([place]) 2012   melanoma  . DYSLIPIDEMIA 09/04/2009  . GERD 09/04/2009  . High cholesterol    just above borderline per patient    Past Surgical History:  Procedure Laterality Date  . [place], 2009   left shoulder scromoplasty  . TONSILLECTOMY      reports that he quit smoking about 35 years ago. His smoking use included cigarettes. He has a 20.00 pack-year smoking history. He has never used smokeless tobacco. He reports that he drinks alcohol. He reports that he does not use drugs. family history includes Glaucoma in his brother and father; Heart disease in his mother; Hypertension in his mother; Melanoma in his father; Stroke in his mother. Allergies  Allergen Reactions  . Levofloxacin     REACTION: hives and GI upset  . Other Hives    levaquin     Review of Systems  Constitutional: Negative for fatigue.  Eyes: Negative for visual disturbance.  Respiratory: Negative for cough, chest tightness and shortness of breath.   Cardiovascular: Negative for chest pain, palpitations and leg swelling.  Neurological: Negative for dizziness, syncope, weakness, light-headedness and headaches.       Objective:   Physical Exam  Constitutional: He appears well-developed and well-nourished.   Cardiovascular: Normal rate and regular rhythm.  Left foot is warm to touch.  He has good capillary refill.  2+ dorsalis pedis and posterior tibial pulse.  No edema  Pulmonary/Chest: Effort normal and breath sounds normal. No respiratory distress. He has no wheezes. He has no rales.  Musculoskeletal: He exhibits no edema.       Assessment:     Frequent muscle cramps left foot and calf.  No evidence for peripheral vascular disease    Plan:     -Stressed importance of good hydration -Check basic metabolic panel and magnesium, though low risk of electrolyte disturbance -Consider B complex multivitamin -Consider mustard supplementation -Avoid Benadryl with past history of BPH -Recommend he try some warm baths at night prior to bedtime -Consider low-dose muscle relaxer if none of the above are helping.  [name] MD [facility] at [facility]

## 2018-01-02 NOTE — Patient Instructions (Signed)
Leg Cramps Leg cramps occur when a muscle or muscles tighten and you have no control over this tightening (involuntary muscle contraction). Muscle cramps can develop in any muscle, but the most common place is in the calf muscles of the leg. Those cramps can occur during exercise or when you are at rest. Leg cramps are painful, and they may last for a few seconds to a few minutes. Cramps may return several times before they finally stop. Usually, leg cramps are not caused by a serious medical problem. In many cases, the cause is not known. Some common causes include:  Overexertion.  Overuse from repetitive motions, or doing the same thing over and over.  Remaining in a certain position for a long period of time.  Improper preparation, form, or technique while performing a sport or an activity.  Dehydration.  Injury.  Side effects of some medicines.  Abnormally low levels of the salts and ions in your blood (electrolytes), especially potassium and calcium. These levels could be low if you are taking water pills (diuretics) or if you are pregnant.  Follow these instructions at home: Watch your condition for any changes. Taking the following actions may help to lessen any discomfort that you are feeling:  Stay well-hydrated. Drink enough fluid to keep your urine clear or pale yellow.  Try massaging, stretching, and relaxing the affected muscle. Do this for several minutes at a time.  For tight or tense muscles, use a warm towel, heating pad, or hot shower water directed to the affected area.  If you are sore or have pain after a cramp, applying ice to the affected area may relieve discomfort. ? Put ice in a plastic bag. ? Place a towel between your skin and the bag. ? Leave the ice on for 20 minutes, 2-3 times per day.  Avoid strenuous exercise for several days if you have been having frequent leg cramps.  Make sure that your diet includes the essential minerals for your muscles to  work normally.  Take medicines only as directed by your health care provider.  Contact a health care provider if:  Your leg cramps get more severe or more frequent, or they do not improve over time.  Your foot becomes cold, numb, or blue. This information is not intended to replace advice given to you by your health care provider. Make sure you discuss any questions you have with your health care provider. Document Released: 02/18/2004 Document Revised: 06/18/2015 Document Reviewed: 12/18/2013 Elsevier Interactive Patient Education  2018 [place].  Stay well hydrated  Consider B complex multivitamin  May consider mustard supplement- one tsp per day before bedtime.   Consider warm bath before bedtime.

## 2018-01-03 LAB — BASIC METABOLIC PANEL
BUN: 12 mg/dL (ref 6–23)
CO2: 31 meq/L (ref 19–32)
Calcium: 9 mg/dL (ref 8.4–10.5)
Chloride: 103 mEq/L (ref 96–112)
Creatinine, Ser: 0.83 mg/dL (ref 0.40–1.50)
GFR: 98.14 mL/min (ref >60.00)
GLUCOSE: 87 mg/dL (ref 70–99)
Potassium: 3.9 mEq/L (ref 3.5–5.1)
SODIUM: 138 meq/L (ref 135–145)

## 2018-01-03 LAB — MAGNESIUM: MAGNESIUM: 2.3 mg/dL (ref 1.5–2.5)

## 2018-01-17 ENCOUNTER — Other Ambulatory Visit: Payer: Self-pay | Admitting: Family Medicine

## 2018-01-23 ENCOUNTER — Telehealth: Payer: Self-pay | Admitting: Family Medicine

## 2018-01-24 HISTORY — PX: GALLBLADDER SURGERY: SHX652

## 2018-01-25 ENCOUNTER — Other Ambulatory Visit: Payer: Self-pay | Admitting: Gastroenterology

## 2018-01-25 ENCOUNTER — Other Ambulatory Visit: Payer: Self-pay

## 2018-01-25 DIAGNOSIS — R1013 Epigastric pain: Secondary | ICD-10-CM | POA: Diagnosis not present

## 2018-01-25 MED ORDER — TAMSULOSIN HCL 0.4 MG PO CAPS: 0.4000 mg | ORAL_CAPSULE | Freq: Every day | ORAL | 1 refills | Status: DC

## 2018-01-25 NOTE — Telephone Encounter (Signed)
Refill has been sent to the pharmacy.  

## 2018-01-25 NOTE — Telephone Encounter (Signed)
Pt states that this medicine has not caused him reactions and it works well for him. Please advise.

## 2018-01-26 ENCOUNTER — Ambulatory Visit
Admission: RE | Admit: 2018-01-26 | Discharge: 2018-01-26 | Disposition: A | Discharge status: Home / Self Care | Payer: BLUE CROSS/BLUE SHIELD | Source: Ambulatory Visit | Attending: Gastroenterology | Admitting: Gastroenterology

## 2018-01-26 DIAGNOSIS — K8020 Calculus of gallbladder without cholecystitis without obstruction: Secondary | ICD-10-CM | POA: Diagnosis not present

## 2018-01-26 DIAGNOSIS — R1013 Epigastric pain: Secondary | ICD-10-CM

## 2018-01-29 DIAGNOSIS — K8010 Calculus of gallbladder with chronic cholecystitis without obstruction: Secondary | ICD-10-CM | POA: Diagnosis not present

## 2018-01-30 DIAGNOSIS — Z01818 Encounter for other preprocedural examination: Secondary | ICD-10-CM | POA: Diagnosis not present

## 2018-02-06 DIAGNOSIS — K8010 Calculus of gallbladder with chronic cholecystitis without obstruction: Secondary | ICD-10-CM | POA: Diagnosis not present

## 2018-02-13 ENCOUNTER — Other Ambulatory Visit: Payer: Self-pay | Admitting: Family Medicine

## 2018-03-05 ENCOUNTER — Other Ambulatory Visit: Payer: Self-pay | Admitting: Family Medicine

## 2018-03-19 ENCOUNTER — Other Ambulatory Visit: Payer: Self-pay | Admitting: Family Medicine

## 2018-03-21 ENCOUNTER — Other Ambulatory Visit: Payer: Self-pay | Admitting: Family Medicine

## 2018-03-26 ENCOUNTER — Other Ambulatory Visit: Payer: Self-pay

## 2018-03-26 ENCOUNTER — Ambulatory Visit (INDEPENDENT_AMBULATORY_CARE_PROVIDER_SITE_OTHER): Payer: BLUE CROSS/BLUE SHIELD | Admitting: Family Medicine

## 2018-03-26 ENCOUNTER — Encounter: Payer: Self-pay | Admitting: Family Medicine

## 2018-03-26 VITALS — BP 110/80 | HR 57 | Temp 97.9°F | Ht 66.25 in | Wt 163.5 lb

## 2018-03-26 DIAGNOSIS — M65312 Trigger thumb, left thumb: Secondary | ICD-10-CM

## 2018-03-26 MED ORDER — METHYLPREDNISOLONE ACETATE 40 MG/ML IJ SUSP
40.0000 mg | Freq: Once | INTRAMUSCULAR | Status: AC
  Administered 2018-03-26: 40 mg via INTRA_ARTICULAR

## 2018-03-26 NOTE — Progress Notes (Signed)
  Subjective:     Patient ID: Dennis Villarreal, male   DOB: March 27, 1950, 68 y.o.   MRN: [id number]  HPI Patient is seen with concern for left trigger thumb.  He has some pain on the volar surface just proximal to the MCP joint.  He uses hands a lot and does a lot of lifting but denies any specific injury.  He states that his thumb frequently "catches "when he flexes this and sometimes has to manually extend the thumb.  No history of injury. Onset several weeks ago.    Past Medical History:  Diagnosis Date  . ALLERGIC RHINITIS 09/04/2009  . Cancer ([place]) 2012   melanoma  . DYSLIPIDEMIA 09/04/2009  . GERD 09/04/2009  . High cholesterol    just above borderline per patient    Past Surgical History:  Procedure Laterality Date  . GALLBLADDER SURGERY  01/2018  . [place], 2009   left shoulder scromoplasty  . TONSILLECTOMY      reports that he quit smoking about 36 years ago. His smoking use included cigarettes. He has a 20.00 pack-year smoking history. He has never used smokeless tobacco. He reports current alcohol use. He reports that he does not use drugs. family history includes Glaucoma in his brother and father; Heart disease in his mother; Hypertension in his mother; Melanoma in his father; Stroke in his mother. Allergies  Allergen Reactions  . Levofloxacin     REACTION: hives and GI upset  . Other Hives    levaquin     Review of Systems  Neurological: Negative for weakness and numbness.       Objective:   Physical Exam Constitutional:      Appearance: Normal appearance.  Cardiovascular:     Rate and Rhythm: Normal rate and regular rhythm.  Musculoskeletal:     Comments: Patient has left trigger thumb.  He has slightly tender nodule on the volar surface of the thumb down near the MCP joint  Neurological:     Mental Status: He is alert.        Assessment:     Left trigger thumb    Plan:     -Discussed risk and benefits of injection of steroid into  the tendon sheath of the left thumb and patient consented. -Injected 40 mg of Depo-Medrol using 25-gauge 5/8 needle and patient tolerated well -Touch base if symptoms not improving over the next couple weeks  [name] MD [facility] at [facility]

## 2018-03-26 NOTE — Patient Instructions (Signed)
Trigger Finger    Trigger finger (stenosing tenosynovitis) is a condition that causes a finger to get stuck in a bent position. Each finger has a tough, cord-like tissue that connects muscle to bone (tendon), and each tendon is surrounded by a tunnel of tissue (tendon sheath). To move your finger, your tendon needs to slide freely through the sheath. Trigger finger happens when the tendon or the sheath thickens, making it difficult to move your finger.  Trigger finger can affect any finger or a thumb. It may affect more than one finger. Mild cases may clear up with rest and medicine. Severe cases require more treatment.  What are the causes?  Trigger finger is caused by a thickened finger tendon or tendon sheath. The cause of this thickening is not known.  What increases the risk?  The following factors may make you more likely to develop this condition:   Doing activities that require a strong grip.   Having rheumatoid arthritis, gout, or diabetes.   Being 40-60 years old.   Being a woman.  What are the signs or symptoms?  Symptoms of this condition include:   Pain when bending or straightening your finger.   Tenderness or swelling where your finger attaches to the palm of your hand.   A lump in the palm of your hand or on the inside of your finger.   Hearing a popping sound when you try to straighten your finger.   Feeling a popping, catching, or locking sensation when you try to straighten your finger.   Being unable to straighten your finger.  How is this diagnosed?  This condition is diagnosed based on your symptoms and a physical exam.  How is this treated?  This condition may be treated by:   Resting your finger and avoiding activities that make symptoms worse.   Wearing a finger splint to keep your finger in a slightly bent position.   Taking NSAIDs to relieve pain and swelling.   Injecting medicine (steroids) into the tendon sheath to reduce swelling and irritation. Injections may need to be  repeated.   Having surgery to open the tendon sheath. This may be done if other treatments do not work and you cannot straighten your finger. You may need physical therapy after surgery.  Follow these instructions at home:     Use moist heat to help reduce pain and swelling as told by your health care provider.   Rest your finger and avoid activities that make pain worse. Return to normal activities as told by your health care provider.   If you have a splint, wear it as told by your health care provider.   Take over-the-counter and prescription medicines only as told by your health care provider.   Keep all follow-up visits as told by your health care provider. This is important.  Contact a health care provider if:   Your symptoms are not improving with home care.  Summary   Trigger finger (stenosing tenosynovitis) causes your finger to get stuck in a bent position, and it can make it difficult and painful to straighten your finger.   This condition develops when a finger tendon or tendon sheath thickens.   Treatment starts with resting, wearing a splint, and taking NSAIDs.   In severe cases, surgery to open the tendon sheath may be needed.  This information is not intended to replace advice given to you by your health care provider. Make sure you discuss any questions you have with your   health care provider.  Document Released: 10/31/2003 Document Revised: 12/22/2015 Document Reviewed: 12/22/2015  Elsevier Interactive Patient Education  2019 Elsevier Inc.

## 2018-04-27 ENCOUNTER — Other Ambulatory Visit: Payer: Self-pay | Admitting: Family Medicine

## 2018-05-19 ENCOUNTER — Other Ambulatory Visit: Payer: Self-pay | Admitting: Family Medicine

## 2018-05-23 ENCOUNTER — Telehealth: Payer: Self-pay

## 2018-05-23 NOTE — Telephone Encounter (Signed)
Patient called and stated that he was having groin pain again and he is going to check with his insurance to make sure they will cover cost of virtual visit and call back to schedule.

## 2018-06-22 ENCOUNTER — Other Ambulatory Visit: Payer: Self-pay | Admitting: Family Medicine

## 2018-06-26 ENCOUNTER — Other Ambulatory Visit: Payer: Self-pay | Admitting: Family Medicine

## 2018-06-26 NOTE — Telephone Encounter (Signed)
Last time patient had filled it was # 30 with 11 refills. Please see new dosage? Not sure if this is correct or if needs to be sent for PA for original amount?

## 2018-06-26 NOTE — Telephone Encounter (Signed)
Should be one daily #30 with 11 refills (unless he can get for 90 days).

## 2018-07-02 ENCOUNTER — Telehealth: Payer: Self-pay

## 2018-07-02 NOTE — Telephone Encounter (Signed)
Copied from [place] [id number]. Topic: General - Inquiry >> Jul 02, 2018  9:53 AM Virl Axe D wrote: Reason for CRM: Pt stated he has questions regarding tadalafil (CIALIS) 5 MG tablet and tamsulosin (FLOMAX) 0.4 MG CAPS capsule. He stated they both address his prostate health but he would like to know if he should be taking both or just one. Requesting callback. Please advise [phone]

## 2018-07-03 NOTE — Telephone Encounter (Signed)
Left a detailed message on verified voice mail.  CRM created.  

## 2018-07-03 NOTE — Telephone Encounter (Signed)
They can be used in combination for treating BPH symptoms.  If further questions consider set up Doxy follow up to address

## 2018-07-11 ENCOUNTER — Other Ambulatory Visit: Payer: Self-pay

## 2018-07-11 ENCOUNTER — Ambulatory Visit (INDEPENDENT_AMBULATORY_CARE_PROVIDER_SITE_OTHER): Payer: BC Managed Care – PPO | Admitting: Family Medicine

## 2018-07-11 DIAGNOSIS — E785 Hyperlipidemia, unspecified: Secondary | ICD-10-CM

## 2018-07-11 DIAGNOSIS — N401 Enlarged prostate with lower urinary tract symptoms: Secondary | ICD-10-CM | POA: Diagnosis not present

## 2018-07-11 DIAGNOSIS — N50819 Testicular pain, unspecified: Secondary | ICD-10-CM

## 2018-07-11 MED ORDER — ATORVASTATIN CALCIUM 20 MG PO TABS: ORAL_TABLET | ORAL | 3 refills | Status: DC

## 2018-07-11 NOTE — Progress Notes (Signed)
Patient ID: Dennis Villarreal, male   DOB: 08-Apr-1950, 68 y.o.   MRN: [id number]  This visit type was conducted due to national recommendations for restrictions regarding the COVID-19 pandemic in an effort to limit this patient's exposure and mitigate transmission in our community.   Virtual Visit via Telephone Note  I connected with Dennis Villarreal on 07/11/18 at  9:45 AM EDT by telephone and verified that I am speaking with the correct person using two identifiers.   I discussed the limitations, risks, security and privacy concerns of performing an evaluation and management service by telephone and the availability of in person appointments. I also discussed with the patient that there may be a patient responsible charge related to this service. The patient expressed understanding and agreed to proceed.  Location patient: home Location provider: work or home office Participants present for the call: patient, provider Patient did not have a visit in the prior 7 days to address this/these issue(s).   History of Present Illness:  3 week hx of achy sensation bilateral testicles.  No edema.  No Urinary symptoms.  He states ibuprofen helps.  He denies any recent injury.  No dysuria.  No gross hematuria.  Symptoms are actually much improved compared to when he made appointment.  He has not noted any swelling to suggest hernia.  History of BPH.  He has seen improvements with Cialis 5 mg daily.  Less improvement with tamsulosin.  Overall stream is fairly strong  Hyperlipidemia treated with Lipitor.  Overdue for follow-up labs.  No myalgias.  No recent chest pains.  He is looking at possible retirement later this year.  Increased work stress.  Has had some recent frequent bilateral headaches mostly occipital area.  He thinks this may be stress related.  His wife is massage therapist and he plans to get her to do some massage.  Past Medical History:  Diagnosis Date  . ALLERGIC RHINITIS 09/04/2009  .  Cancer ([place]) 2012   melanoma  . DYSLIPIDEMIA 09/04/2009  . GERD 09/04/2009  . High cholesterol    just above borderline per patient    Past Surgical History:  Procedure Laterality Date  . GALLBLADDER SURGERY  01/2018  . [place], 2009   left shoulder scromoplasty  . TONSILLECTOMY      reports that he quit smoking about 36 years ago. His smoking use included cigarettes. He has a 20.00 pack-year smoking history. He has never used smokeless tobacco. He reports current alcohol use. He reports that he does not use drugs. family history includes Glaucoma in his brother and father; Heart disease in his mother; Hypertension in his mother; Melanoma in his father; Stroke in his mother. Allergies  Allergen Reactions  . Levofloxacin     REACTION: hives and GI upset  . Other Hives    levaquin      Observations/Objective: Patient sounds cheerful and well on the phone. I do not appreciate any SOB. Speech and thought processing are grossly intact. Patient reported vitals:  Assessment and Plan:  #1 hyperlipidemia.  Patient overdue for labs -We will come Friday for fasting lipid panel and hepatic panel  #2 BPH stable -Continue Cialis 5 mg daily  #3 bilateral testicular pain.  He has similar pain in the past.  Improving.   -Continue ibuprofen and good support briefs and office visit if not resolving by next week    Follow Up Instructions:  -As above   99441 5-10 99442 11-20 99443 21-30 I did  not refer this patient for an OV in the next 24 hours for this/these issue(s).  I discussed the assessment and treatment plan with the patient. The patient was provided an opportunity to ask questions and all were answered. The patient agreed with the plan and demonstrated an understanding of the instructions.   The patient was advised to call back or seek an in-person evaluation if the symptoms worsen or if the condition fails to improve as anticipated.  I provided 22 minutes  of non-face-to-face time during this encounter.   Carolann Littler, MD

## 2018-07-13 ENCOUNTER — Other Ambulatory Visit: Payer: Self-pay

## 2018-07-13 ENCOUNTER — Other Ambulatory Visit (INDEPENDENT_AMBULATORY_CARE_PROVIDER_SITE_OTHER): Payer: BC Managed Care – PPO

## 2018-07-13 DIAGNOSIS — E785 Hyperlipidemia, unspecified: Secondary | ICD-10-CM

## 2018-07-13 LAB — LIPID PANEL
Cholesterol: 133 mg/dL (ref 0–200)
HDL: 40.2 mg/dL (ref >39.00)
LDL Cholesterol: 77 mg/dL (ref 0–99)
NonHDL: 92.39
Total CHOL/HDL Ratio: 3
Triglycerides: 78 mg/dL (ref 0.0–149.0)
VLDL: 15.6 mg/dL (ref 0.0–40.0)

## 2018-07-13 LAB — HEPATIC FUNCTION PANEL
ALT: 12 U/L (ref 0–53)
AST: 14 U/L (ref 0–37)
Albumin: 4.3 g/dL (ref 3.5–5.2)
Alkaline Phosphatase: 53 U/L (ref 39–117)
Bilirubin, Direct: 0.2 mg/dL (ref 0.0–0.3)
Total Bilirubin: 1 mg/dL (ref 0.2–1.2)
Total Protein: 6.3 g/dL (ref 6.0–8.3)

## 2018-07-13 NOTE — Addendum Note (Signed)
Addended by: [name] on: 07/13/2018 08:18 AM   Modules accepted: Orders

## 2018-07-17 ENCOUNTER — Telehealth: Payer: Self-pay

## 2018-07-17 MED ORDER — METHOCARBAMOL 500 MG PO TABS: 500.0000 mg | ORAL_TABLET | Freq: Four times a day (QID) | ORAL | 0 refills | Status: DC | PRN

## 2018-07-17 NOTE — Telephone Encounter (Signed)
Called patient and he stated that he is having pain in his groin area and Dr. Elease Hashimoto stated that he could try a muscle relaxer and the pain is keeping him up at night and he takes Ibuprofin 200 mg 4 times a day due to the pain being so bad. Patient states this pain comes and goes.  Could you send in a muscle relaxer so he could try before he schedules an in office visit? Just to see if he does get any relief. [facility]

## 2018-07-17 NOTE — Telephone Encounter (Signed)
I sent in some Robaxin- but needs office follow up if not improving with that.

## 2018-07-17 NOTE — Telephone Encounter (Signed)
Copied from [place] [id number]. Topic: General - Inquiry >> Jul 16, 2018  3:07 PM Scherrie Gerlach wrote: Reason for CRM: pt would like a call back from [place].  He states he has some follow up questions from his visit the other day.

## 2018-07-18 NOTE — Telephone Encounter (Signed)
Please advise 

## 2018-07-18 NOTE — Telephone Encounter (Signed)
I thought we had already addressed at office visit.  He should continue with daily Cialis (which he stated helped more) and stop the Tamsulosin.

## 2018-07-19 NOTE — Telephone Encounter (Signed)
Spoke with patient. He stated that he has already started the medication and that he has not had pain for a few days. He will call back if pain returns. Nothing further needed.

## 2018-08-11 ENCOUNTER — Other Ambulatory Visit: Payer: Self-pay | Admitting: Family Medicine

## 2018-08-14 DIAGNOSIS — M79642 Pain in left hand: Secondary | ICD-10-CM | POA: Diagnosis not present

## 2018-08-14 DIAGNOSIS — M65332 Trigger finger, left middle finger: Secondary | ICD-10-CM | POA: Diagnosis not present

## 2018-08-14 DIAGNOSIS — M65312 Trigger thumb, left thumb: Secondary | ICD-10-CM | POA: Diagnosis not present

## 2018-08-14 DIAGNOSIS — M1812 Unilateral primary osteoarthritis of first carpometacarpal joint, left hand: Secondary | ICD-10-CM | POA: Diagnosis not present

## 2018-08-20 ENCOUNTER — Other Ambulatory Visit: Payer: Self-pay

## 2018-08-20 ENCOUNTER — Telehealth: Payer: Self-pay | Admitting: Family Medicine

## 2018-08-20 MED ORDER — METAXALONE 800 MG PO TABS: 800.0000 mg | ORAL_TABLET | Freq: Three times a day (TID) | ORAL | 0 refills | Status: DC | PRN

## 2018-08-20 NOTE — Telephone Encounter (Signed)
Pt called and stated he keeps having a reaction since taking methocarbamol (ROBAXIN) 500 MG tablet  Pt states it make him very dizzy and nauseous/ Pt would like a call about what Dr. Elease Hashimoto will advise him to take as an alternative that may not cause that reaction/ please advise

## 2018-08-20 NOTE — Telephone Encounter (Signed)
Please see message. °

## 2018-08-20 NOTE — Telephone Encounter (Signed)
Called patient and gave him the message from Dr. Elease Hashimoto. Patient verbalized an understanding and will try this and if he has any difficulty he will let us know. I have sent this to the pharmacy for the patient.

## 2018-08-20 NOTE — Telephone Encounter (Signed)
Unfortunately, any muscle relaxer can cause those symptoms.  D/C Robaxin and consider trial of Skelaxin 800 mg po q 8 hours prn muscle spasm  #30 with no refills.

## 2018-08-22 ENCOUNTER — Other Ambulatory Visit: Payer: Self-pay | Admitting: Family Medicine

## 2018-08-24 ENCOUNTER — Encounter: Payer: Self-pay | Admitting: Family Medicine

## 2018-08-24 DIAGNOSIS — D124 Benign neoplasm of descending colon: Secondary | ICD-10-CM | POA: Diagnosis not present

## 2018-08-24 DIAGNOSIS — Z1211 Encounter for screening for malignant neoplasm of colon: Secondary | ICD-10-CM | POA: Diagnosis not present

## 2018-08-24 DIAGNOSIS — D122 Benign neoplasm of ascending colon: Secondary | ICD-10-CM | POA: Diagnosis not present

## 2018-08-24 DIAGNOSIS — D123 Benign neoplasm of transverse colon: Secondary | ICD-10-CM | POA: Diagnosis not present

## 2018-10-25 DIAGNOSIS — Z23 Encounter for immunization: Secondary | ICD-10-CM | POA: Diagnosis not present

## 2018-12-14 ENCOUNTER — Other Ambulatory Visit: Payer: Self-pay

## 2018-12-14 DIAGNOSIS — Z20822 Contact with and (suspected) exposure to covid-19: Secondary | ICD-10-CM

## 2018-12-17 LAB — NOVEL CORONAVIRUS, NAA: SARS-CoV-2, NAA: NOT DETECTED

## 2018-12-25 ENCOUNTER — Other Ambulatory Visit: Payer: Self-pay | Admitting: Family Medicine

## 2018-12-27 ENCOUNTER — Telehealth: Payer: Self-pay | Admitting: Family Medicine

## 2018-12-27 MED ORDER — TADALAFIL 5 MG PO TABS: 5.0000 mg | ORAL_TABLET | Freq: Every day | ORAL | 1 refills | Status: DC

## 2018-12-27 NOTE — Telephone Encounter (Signed)
Medication Refill - Medication: tadalafil (CIALIS) 5 MG tablet [id number]     Preferred Pharmacy (with phone number or street name):  [id number] [place] [place] - [address]  [address] [place] [address]  Phone: [phone] Fax: [phone]     Agent: Please be advised that RX refills may take up to 3 business days. We ask that you follow-up with your pharmacy.

## 2019-01-10 DIAGNOSIS — M65332 Trigger finger, left middle finger: Secondary | ICD-10-CM | POA: Diagnosis not present

## 2019-01-21 DIAGNOSIS — K644 Residual hemorrhoidal skin tags: Secondary | ICD-10-CM | POA: Diagnosis not present

## 2019-01-21 DIAGNOSIS — K5901 Slow transit constipation: Secondary | ICD-10-CM | POA: Diagnosis not present

## 2019-01-28 NOTE — Telephone Encounter (Signed)
Pt called in requesting a call back from [place], pt says that he would like to discuss getting medication. Pt says that his insurance [employer] isn't able to fill Rx.   Please assist.

## 2019-01-30 NOTE — Telephone Encounter (Signed)
Pt returned missed call from [facility]. Stated he would be by his phone for her to call him back. Please advise. No answer on [facility] Line

## 2019-01-30 NOTE — Telephone Encounter (Signed)
Called patient and NOT able to LMOVM to return call  [place] for [facility] to Discuss results / PCP / recommendations / Schedule patient  Called patient and was not able to leave a voice message due to a full mail box. I will try at a later time.  CRM Created.

## 2019-02-01 NOTE — Telephone Encounter (Signed)
Called patient and he stated that he has new insurance now and it is: [employer] PPO Patient ID [id number] Group # [id number] [place] I: [id number] Rx PCN: [id number]  May need prior authorization on tadalafil

## 2019-02-06 NOTE — Telephone Encounter (Signed)
Received approval letter from [facility] Approved 01/25/2019 through 01/24/2020  For 2.5mg  and 5,g Cialis (tadalafil) for treatment of Benign Prostatic Hyperplasia (BPH)

## 2019-02-16 ENCOUNTER — Other Ambulatory Visit: Payer: Self-pay | Admitting: Family Medicine

## 2019-02-26 ENCOUNTER — Telehealth: Payer: Self-pay | Admitting: Family Medicine

## 2019-02-26 ENCOUNTER — Other Ambulatory Visit: Payer: Self-pay

## 2019-02-26 ENCOUNTER — Other Ambulatory Visit: Payer: Self-pay | Admitting: Family Medicine

## 2019-02-26 DIAGNOSIS — N50811 Right testicular pain: Secondary | ICD-10-CM

## 2019-02-26 DIAGNOSIS — N50812 Left testicular pain: Secondary | ICD-10-CM

## 2019-02-26 NOTE — Telephone Encounter (Signed)
Called the patient again and gave him the message from Dr. Elease Hashimoto. I have ordered a urology referral for patient.

## 2019-02-26 NOTE — Telephone Encounter (Signed)
Confirm if this is testicular pain he had previously.  If so, refer to urology.  If not, let me know so we can address appropriate referral

## 2019-02-26 NOTE — Telephone Encounter (Signed)
Called patient and LMOVM to return call  [place] for [facility] to Discuss results / PCP / recommendations / Schedule patient  I will try to call the patient back to go over the message from Dr. Elease Hashimoto.

## 2019-02-26 NOTE — Telephone Encounter (Signed)
Please advise 

## 2019-02-26 NOTE — Telephone Encounter (Signed)
Pt states that he has been seeing Dr. Elease Hashimoto regarding an ongoing problem with pain in his groin. Pt would like to be referred to a specialist Pt can be reached at [phone]

## 2019-03-01 ENCOUNTER — Ambulatory Visit: Payer: BC Managed Care – PPO | Admitting: Family Medicine

## 2019-03-24 ENCOUNTER — Other Ambulatory Visit: Payer: Self-pay | Admitting: Family Medicine

## 2019-04-23 ENCOUNTER — Other Ambulatory Visit: Payer: Self-pay

## 2019-04-24 ENCOUNTER — Encounter: Payer: Self-pay | Admitting: Family Medicine

## 2019-04-24 ENCOUNTER — Ambulatory Visit (INDEPENDENT_AMBULATORY_CARE_PROVIDER_SITE_OTHER): Payer: Medicare HMO | Admitting: Family Medicine

## 2019-04-24 VITALS — BP 120/68 | HR 77 | Temp 98.1°F | Wt 159.2 lb

## 2019-04-24 DIAGNOSIS — M7711 Lateral epicondylitis, right elbow: Secondary | ICD-10-CM | POA: Diagnosis not present

## 2019-04-24 DIAGNOSIS — M7631 Iliotibial band syndrome, right leg: Secondary | ICD-10-CM | POA: Diagnosis not present

## 2019-04-24 NOTE — Progress Notes (Signed)
Subjective:     Patient ID: ROWLEY GROENEWEG, male   DOB: Dec 02, 1950, 69 y.o.   MRN: [id number]  HPI Dennis Villarreal is seen for couple of newer complaints.  He is generally very healthy.  He has history of GERD, BPH, hyperlipidemia.  He has remote history of melanoma.  He retired in October.  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  He developed some right lateral knee pain.  Denies any specific injury.  No visible swelling.  No ecchymosis.  No alleviating factors.  He did not try any icing.  No medial knee pain.  No locking or giving way.  Second issue is some right lateral elbow pain.  He noticed some stiffness with elbow with movement with flexion extension.  Location was right lateral elbow.  Currently improved.  He is right-hand dominant.  He enjoys doing things like carpentry but no recent obvious overuse activities   Past Medical History:  Diagnosis Date  . ALLERGIC RHINITIS 09/04/2009  . Cancer ([place]) 2012   melanoma  . DYSLIPIDEMIA 09/04/2009  . GERD 09/04/2009  . High cholesterol    just above borderline per patient    Past Surgical History:  Procedure Laterality Date  . GALLBLADDER SURGERY  01/2018  . [place], 2009   left shoulder scromoplasty  . TONSILLECTOMY      reports that he quit smoking about 37 years ago. His smoking use included cigarettes. He has a 20.00 pack-year smoking history. He has never used smokeless tobacco. He reports current alcohol use. He reports that he does not use drugs. family history includes Glaucoma in his brother and father; Heart disease in his mother; Hypertension in his mother; Melanoma in his father; Stroke in his mother. Allergies  Allergen Reactions  . Levofloxacin     REACTION: hives and GI upset  . Other Hives    levaquin    .  Review of Systems  Constitutional: Negative for chills and fever.  Respiratory:  Negative for shortness of breath.   Cardiovascular: Negative for chest pain.  Musculoskeletal: Negative for back pain, joint swelling, neck pain and neck stiffness.  Skin: Negative for rash.  Neurological: Negative for weakness.  Hematological: Negative for adenopathy.       Objective:   Physical Exam Vitals reviewed.  Constitutional:      Appearance: Normal appearance.  Cardiovascular:     Rate and Rhythm: Normal rate and regular rhythm.  Pulmonary:     Effort: Pulmonary effort is normal.     Breath sounds: Normal breath sounds.  Musculoskeletal:     Comments: Right elbow reveals full range of motion.  No warmth.  No erythema.  No ecchymosis.  No localized tenderness at this time.  No pain with supination or pronation.  Right knee reveals full range of motion.  No effusion.  No warmth.  He has some tenderness lateral knee at the attachment of the iliotibial band to the fibular head.  No medial or lateral joint line tenderness.  No knee instability.  Neurological:     Mental Status: He is alert.        Assessment:     #1 right lateral epicondylitis-currently stable but with some recent inflammation  #2 right iliotibial band syndrome-with likely tendinitis at the distal attachment    Plan:     -We recommend icing 15  to 20 minutes 2-3 times daily for both the elbow and knee as needed  -Consider Voltaren gel 3-4 times daily as needed for elbow and knee pain  -Touch base if not improving with the above  [name] MD [facility]

## 2019-04-24 NOTE — Patient Instructions (Signed)
Try icing the elbow and right knee 15-20 minutes 2-3 times daily  Consider Voltaren/Diclofenac gel 2-3 times daily.    Iliotibial Band Syndrome  Iliotibial band syndrome (ITBS) is a condition that often causes knee pain. It can also cause pain in the outside of your hip, thigh, and knee. The iliotibial band is a strip of tissue that runs from the outside of your hip and down your thigh to the outside of your knee. Repeatedly bending and straightening your knee can irritate the iliotibial band. What are the causes? This condition is caused by inflammation and irritation from the friction of the iliotibial band moving over the thigh bone (femur) when you repeatedly bend and straighten your knee. What increases the risk? This condition is more likely to develop in people who:  Frequently change elevation during their workouts.  Run very long distances.  Recently increased the length or intensity of their workouts.  Run downhill often, or just started running downhill.  Ride a bike very far or often. You may also be at greater risk if you start a new workout routine without first warming up or if you have a job that requires you to bend, squat, or climb frequently. What are the signs or symptoms? Symptoms of this condition include:  Pain along the outside of your knee that may be worse with activity, especially running or going up and down stairs.  A "snapping" sensation over your knee.  Swelling on the outside of your knee.  Pain or a feeling of tightness in your hip. How is this diagnosed? This condition is diagnosed based on your symptoms, medical history, and physical exam. You may also see a health care provider who specializes in reducing pain and increasing mobility (physical therapist). A physical therapist may do an exam to check your balance, movement, and way of walking or running (gait) to see whether the way you move could contribute to your injury. You may also have tests  to measure your strength, flexibility, and range of motion. How is this treated? Treatment for this condition includes:  Resting and limiting exercise.  Returning to activities gradually.  Doing range-of-motion and strengthening exercises (physical therapy) as told by your health care provider.  Including low-impact activities, such as swimming, in your exercise routine. Follow these instructions at home:  If directed, apply ice to the injured area. ? Put ice in a plastic bag. ? Place a towel between your skin and the bag. ? Leave the ice on for 20 minutes, 2-3 times per day.  Return to your normal activities as told by your health care provider. Ask your health care provider what activities are safe for you.  Keep all follow-up visits with your health care provider. This is important. Contact a health care provider if:  Your pain does not improve or gets worse despite treatment. This information is not intended to replace advice given to you by your health care provider. Make sure you discuss any questions you have with your health care provider. Document Revised: 12/23/2016 Document Reviewed: 02/12/2016 Elsevier Patient Education  [place].

## 2019-05-02 DIAGNOSIS — M1812 Unilateral primary osteoarthritis of first carpometacarpal joint, left hand: Secondary | ICD-10-CM | POA: Diagnosis not present

## 2019-05-02 DIAGNOSIS — M79642 Pain in left hand: Secondary | ICD-10-CM | POA: Diagnosis not present

## 2019-05-02 DIAGNOSIS — M65312 Trigger thumb, left thumb: Secondary | ICD-10-CM | POA: Diagnosis not present

## 2019-05-02 DIAGNOSIS — M65332 Trigger finger, left middle finger: Secondary | ICD-10-CM | POA: Diagnosis not present

## 2019-05-06 DIAGNOSIS — Z86018 Personal history of other benign neoplasm: Secondary | ICD-10-CM | POA: Diagnosis not present

## 2019-05-06 DIAGNOSIS — L820 Inflamed seborrheic keratosis: Secondary | ICD-10-CM | POA: Diagnosis not present

## 2019-05-06 DIAGNOSIS — Z808 Family history of malignant neoplasm of other organs or systems: Secondary | ICD-10-CM | POA: Diagnosis not present

## 2019-05-06 DIAGNOSIS — L814 Other melanin hyperpigmentation: Secondary | ICD-10-CM | POA: Diagnosis not present

## 2019-05-06 DIAGNOSIS — Z85820 Personal history of malignant melanoma of skin: Secondary | ICD-10-CM | POA: Diagnosis not present

## 2019-05-06 DIAGNOSIS — D485 Neoplasm of uncertain behavior of skin: Secondary | ICD-10-CM | POA: Diagnosis not present

## 2019-05-06 DIAGNOSIS — D225 Melanocytic nevi of trunk: Secondary | ICD-10-CM | POA: Diagnosis not present

## 2019-05-06 DIAGNOSIS — L821 Other seborrheic keratosis: Secondary | ICD-10-CM | POA: Diagnosis not present

## 2019-05-06 DIAGNOSIS — C44319 Basal cell carcinoma of skin of other parts of face: Secondary | ICD-10-CM | POA: Diagnosis not present

## 2019-05-06 DIAGNOSIS — L578 Other skin changes due to chronic exposure to nonionizing radiation: Secondary | ICD-10-CM | POA: Diagnosis not present

## 2019-05-22 DIAGNOSIS — M65332 Trigger finger, left middle finger: Secondary | ICD-10-CM | POA: Diagnosis not present

## 2019-06-17 DIAGNOSIS — C44319 Basal cell carcinoma of skin of other parts of face: Secondary | ICD-10-CM | POA: Diagnosis not present

## 2019-07-01 DIAGNOSIS — M65332 Trigger finger, left middle finger: Secondary | ICD-10-CM | POA: Diagnosis not present

## 2019-07-01 DIAGNOSIS — M25642 Stiffness of left hand, not elsewhere classified: Secondary | ICD-10-CM | POA: Diagnosis not present

## 2019-07-01 DIAGNOSIS — M79645 Pain in left finger(s): Secondary | ICD-10-CM | POA: Diagnosis not present

## 2019-07-08 DIAGNOSIS — M79645 Pain in left finger(s): Secondary | ICD-10-CM | POA: Diagnosis not present

## 2019-07-08 DIAGNOSIS — M25642 Stiffness of left hand, not elsewhere classified: Secondary | ICD-10-CM | POA: Diagnosis not present

## 2019-07-08 DIAGNOSIS — M65332 Trigger finger, left middle finger: Secondary | ICD-10-CM | POA: Diagnosis not present

## 2019-07-13 ENCOUNTER — Other Ambulatory Visit: Payer: Self-pay | Admitting: Family Medicine

## 2019-07-15 ENCOUNTER — Other Ambulatory Visit: Payer: Self-pay | Admitting: Family Medicine

## 2019-07-15 DIAGNOSIS — M65332 Trigger finger, left middle finger: Secondary | ICD-10-CM | POA: Diagnosis not present

## 2019-07-15 DIAGNOSIS — M79645 Pain in left finger(s): Secondary | ICD-10-CM | POA: Diagnosis not present

## 2019-07-15 DIAGNOSIS — M25642 Stiffness of left hand, not elsewhere classified: Secondary | ICD-10-CM | POA: Diagnosis not present

## 2019-07-19 ENCOUNTER — Other Ambulatory Visit: Payer: Self-pay | Admitting: Family Medicine

## 2019-07-26 ENCOUNTER — Other Ambulatory Visit: Payer: Self-pay | Admitting: Family Medicine

## 2019-08-05 DIAGNOSIS — R69 Illness, unspecified: Secondary | ICD-10-CM | POA: Diagnosis not present

## 2019-09-11 ENCOUNTER — Other Ambulatory Visit: Payer: Self-pay | Admitting: Family Medicine

## 2019-10-11 ENCOUNTER — Other Ambulatory Visit: Payer: Self-pay | Admitting: Family Medicine

## 2019-11-04 DIAGNOSIS — R69 Illness, unspecified: Secondary | ICD-10-CM | POA: Diagnosis not present

## 2019-11-07 DIAGNOSIS — L821 Other seborrheic keratosis: Secondary | ICD-10-CM | POA: Diagnosis not present

## 2019-11-07 DIAGNOSIS — Z808 Family history of malignant neoplasm of other organs or systems: Secondary | ICD-10-CM | POA: Diagnosis not present

## 2019-11-07 DIAGNOSIS — D225 Melanocytic nevi of trunk: Secondary | ICD-10-CM | POA: Diagnosis not present

## 2019-11-07 DIAGNOSIS — L814 Other melanin hyperpigmentation: Secondary | ICD-10-CM | POA: Diagnosis not present

## 2019-11-07 DIAGNOSIS — D2239 Melanocytic nevi of other parts of face: Secondary | ICD-10-CM | POA: Diagnosis not present

## 2019-11-07 DIAGNOSIS — L578 Other skin changes due to chronic exposure to nonionizing radiation: Secondary | ICD-10-CM | POA: Diagnosis not present

## 2019-11-07 DIAGNOSIS — Z85820 Personal history of malignant melanoma of skin: Secondary | ICD-10-CM | POA: Diagnosis not present

## 2019-11-07 DIAGNOSIS — Z86018 Personal history of other benign neoplasm: Secondary | ICD-10-CM | POA: Diagnosis not present

## 2019-12-02 ENCOUNTER — Ambulatory Visit (INDEPENDENT_AMBULATORY_CARE_PROVIDER_SITE_OTHER): Payer: Medicare HMO

## 2019-12-02 DIAGNOSIS — Z0000 Encounter for general adult medical examination without abnormal findings: Secondary | ICD-10-CM | POA: Diagnosis not present

## 2019-12-02 NOTE — Progress Notes (Signed)
Subjective:   Dennis Villarreal is a 69 y.o. male who presents for an Initial Medicare Annual Wellness Visit.  I connected with Lorris Carducci  today by telephone and verified that I am speaking with the correct person using two identifiers. Location patient: home Location provider: work Persons participating in the virtual visit: patient, provider.   I discussed the limitations, risks, security and privacy concerns of performing an evaluation and management service by telephone and the availability of in person appointments. I also discussed with the patient that there may be a patient responsible charge related to this service. The patient expressed understanding and verbally consented to this telephonic visit.    Interactive audio and video telecommunications were attempted between this provider and patient, however failed, due to patient having technical difficulties OR patient did not have access to video capability.  We continued and completed visit with audio only.     Review of Systems    N/A Cardiac Risk Factors include: advanced age (>56men, >72 women);male gender;dyslipidemia     Objective:    Today's Vitals   There is no height or weight on file to calculate BMI.  Advanced Directives 12/02/2019  Does Patient Have a Medical Advance Directive? No  Would patient like information on creating a medical advance directive? No - Patient declined    Current Medications (verified) Outpatient Encounter Medications as of 12/02/2019  Medication Sig  . atorvastatin (LIPITOR) 20 MG tablet TAKE 1 TABLET BY MOUTH EVERY DAY  . Diclofenac Sodium 3 % GEL Place 1 application onto the skin 4 (four) times daily as needed.  . multivitamin (ONE-A-DAY MEN'S) TABS Take 1 tablet by mouth daily.    . pantoprazole (PROTONIX) 40 MG tablet TAKE 1 TABLET BY MOUTH EVERY DAY  . tadalafil (CIALIS) 5 MG tablet TAKE 1 TABLET BY MOUTH EVERY DAY  . tamsulosin (FLOMAX) 0.4 MG CAPS capsule TAKE 1 CAPSULE  BY MOUTH EVERY DAY  . aspirin 81 MG tablet Take 81 mg by mouth daily. (Patient not taking: Reported on 12/02/2019)  . methocarbamol (ROBAXIN) 500 MG tablet TAKE 1 TABLET (500 MG TOTAL) BY MOUTH EVERY 6 (SIX) HOURS AS NEEDED FOR MUSCLE SPASMS. (Patient not taking: Reported on 12/02/2019)  . polyethylene glycol powder (GLYCOLAX/MIRALAX) powder Take 1 Container by mouth once. (Patient not taking: Reported on 12/02/2019)  . [DISCONTINUED] metaxalone (SKELAXIN) 800 MG tablet TAKE 1 TABLET BY MOUTH EVERY 8 HOURS AS NEEDED FOR MUSCLE SPASMS   No facility-administered encounter medications on file as of 12/02/2019.    Allergies (verified) Levofloxacin and Other   History: Past Medical History:  Diagnosis Date  . ALLERGIC RHINITIS 09/04/2009  . Basal cell carcinoma   . Cancer ([place]) 2012   melanoma  . DYSLIPIDEMIA 09/04/2009  . GERD 09/04/2009  . High cholesterol    just above borderline per patient    Past Surgical History:  Procedure Laterality Date  . GALLBLADDER SURGERY  01/2018  . [place], 2009   left shoulder scromoplasty  . TONSILLECTOMY    . TRIGGER FINGER RELEASE Left    Family History  Problem Relation Age of Onset  . Heart disease Mother        CVA and cardiac arrhythmia, ? atrial fib  . Stroke Mother   . Hypertension Mother   . Melanoma Father   . Glaucoma Father   . Glaucoma Brother    Social History   Socioeconomic History  . Marital status: Married    Spouse name:  Not on file  . Number of children: 3  . Years of education: College  . Highest education level: Not on file  Occupational History  . Occupation: TEFL teacher      Comment: Estate manager/land agent  Tobacco Use  . Smoking status: Former Smoker    Packs/day: 1.00    Years: 20.00    Pack years: 20.00    Types: Cigarettes    Quit date: 01/24/1982    Years since quitting: 37.8  . Smokeless tobacco: Never Used  Vaping Use  . Vaping Use: Never used  Substance and Sexual Activity  .  Alcohol use: Yes    Alcohol/week: 0.0 standard drinks    Comment: socially  . Drug use: No  . Sexual activity: Not on file  Other Topics Concern  . Not on file  Social History Narrative   Lives at home with wife and youngest daughter.   Has 3 children.    Caffeine: 2 cups/day    Social Determinants of Health   Financial Resource Strain: Low Risk   . Difficulty of Paying Living Expenses: Not hard at all  Food Insecurity: No Food Insecurity  . Worried About Charity fundraiser in the Last Year: Never true  . Ran Out of Food in the Last Year: Never true  Transportation Needs: No Transportation Needs  . Lack of Transportation (Medical): No  . Lack of Transportation (Non-Medical): No  Physical Activity: Inactive  . Days of Exercise per Week: 0 days  . Minutes of Exercise per Session: 0 min  Stress: No Stress Concern Present  . Feeling of Stress : Not at all  Social Connections: Moderately Integrated  . Frequency of Communication with Friends and Family: More than three times a week  . Frequency of Social Gatherings with Friends and Family: More than three times a week  . Attends Religious Services: Never  . Active Member of Clubs or Organizations: Yes  . Attends Archivist Meetings: More than 4 times per year  . Marital Status: Married    Tobacco Counseling Counseling given: Not Answered   Clinical Intake:  Pre-visit preparation completed: Yes  Pain : No/denies pain     Nutritional Risks: None Diabetes: No  How often do you need to have someone help you when you read instructions, pamphlets, or other written materials from your doctor or pharmacy?: 1 - Never What is the last grade level you completed in school?: 3 year of college  Diabetic?No   Interpreter Needed?: No  Information entered by :: [place] of Daily Living In your present state of health, do you have any difficulty performing the following activities: 12/02/2019  Hearing?  Y  Comment has bilateral hearing aids  Vision? N  Difficulty concentrating or making decisions? N  Walking or climbing stairs? N  Dressing or bathing? N  Doing errands, shopping? N  Preparing Food and eating ? N  Using the Toilet? N  In the past six months, have you accidently leaked urine? Y  Comment has occassional dribble with urine  Do you have problems with loss of bowel control? Y  Comment has some issues with bowel incontinence if eats wrong foods since has hx of gallbladder removal  Managing your Medications? N  Managing your Finances? N  Housekeeping or managing your Housekeeping? N  Some recent data might be hidden    Patient Care Team: [name], MD as PCP - General  Indicate any recent Medical Services  you may have received from other than [facility] providers in the past year (date may be approximate).     Assessment:   This is a routine wellness examination for Faye.  Hearing/Vision screen  Hearing Screening   125Hz  250Hz  500Hz  1000Hz  2000Hz  3000Hz  4000Hz  6000Hz  8000Hz   Right ear:           Left ear:           Vision Screening Comments: Patient states gets eyes once per year in October.  Dietary issues and exercise activities discussed: Current Exercise Habits: The patient has a physically strenuous job, but has no regular exercise apart from work., Exercise limited by: None identified  Goals    . Exercise 3x per week (30 min per time)    . Patient Stated     I would like to travel and be a little more spontaneous on vacation       Depression Screen PHQ 2/9 Scores 12/02/2019  PHQ - 2 Score 0  PHQ- 9 Score 0    Fall Risk Fall Risk  12/02/2019  Falls in the past year? 0  Number falls in past yr: 0  Injury with Fall? 0  Risk for fall due to : No Fall Risks  Follow up Falls evaluation completed;Falls prevention discussed    Any stairs in or around the home? Yes  If so, are there any without handrails? No  Home free of loose throw rugs in  walkways, pet beds, electrical cords, etc? Yes  Adequate lighting in your home to reduce risk of falls? Yes   ASSISTIVE DEVICES UTILIZED TO PREVENT FALLS:  Life alert? No  Use of a cane, walker or w/c? No  Grab bars in the bathroom? No  Shower chair or bench in shower? No  Elevated toilet seat or a handicapped toilet? No    Cognitive Function:        Immunizations Immunization History  Administered Date(s) Administered  . Influenza Split 11/03/2012  . Influenza, High Dose Seasonal PF 12/07/2016, 12/12/2017, 10/25/2018, 11/04/2019  . Influenza-Unspecified 10/29/2013, 10/25/2015, 11/24/2016  . PFIZER SARS-COV-2 Vaccination 03/02/2019, 03/23/2019, 10/29/2019  . Pneumococcal Conjugate-13 01/16/2017  . Tdap 04/02/2014  . Zoster Recombinat (Shingrix) 06/30/2017, 12/22/2017    TDAP status: Up to date Flu Vaccine status: Up to date Pneumococcal vaccine status: Declined,  Education has been provided regarding the importance of this vaccine but patient still declined. Advised may receive this vaccine at local pharmacy or Health Dept. Aware to provide a copy of the vaccination record if obtained from local pharmacy or Health Dept. Verbalized acceptance and understanding.  Covid-19 vaccine status: Completed vaccines  Qualifies for Shingles Vaccine? Yes   Zostavax completed No   Shingrix Completed?: Yes  Screening Tests Health Maintenance  Topic Date Due  . PNA vac Low Risk Adult (2 of 2 - PPSV23) 01/16/2018  . TETANUS/TDAP  04/01/2024  . COLONOSCOPY  08/23/2028  . INFLUENZA VACCINE  Completed  . COVID-19 Vaccine  Completed  . Hepatitis C Screening  Completed    Health Maintenance  Health Maintenance Due  Topic Date Due  . PNA vac Low Risk Adult (2 of 2 - PPSV23) 01/16/2018    Colorectal cancer screening: Completed 08/24/2018. Repeat every 10 years  Lung Cancer Screening: (Low Dose CT Chest recommended if Age 65-80 years, 30 pack-year currently smoking OR have quit w/in  15years.) does not qualify.   Lung Cancer Screening Referral: N/A  Additional Screening:  Hepatitis C Screening: does qualify; Completed 01/16/2017  Vision Screening: Recommended annual ophthalmology exams for early detection of glaucoma and other disorders of the eye. Is the patient up to date with their annual eye exam?  Yes  Who is the provider or what is the name of the office in which the patient attends annual eye exams? Dr. Jodene Nam in [place] on [address]  If pt is not established with a provider, would they like to be referred to a provider to establish care? No .   Dental Screening: Recommended annual dental exams for proper oral hygiene  Community Resource Referral / Chronic Care Management: CRR required this visit?  No   CCM required this visit?  No      Plan:     I have personally reviewed and noted the following in the patient's chart:   . Medical and social history . Use of alcohol, tobacco or illicit drugs  . Current medications and supplements . Functional ability and status . Nutritional status . Physical activity . Advanced directives . List of other physicians . Hospitalizations, surgeries, and ER visits in previous 12 months . Vitals . Screenings to include cognitive, depression, and falls . Referrals and appointments  In addition, I have reviewed and discussed with patient certain preventive protocols, quality metrics, and best practice recommendations. A written personalized care plan for preventive services as well as general preventive health recommendations were provided to patient.     Ofilia Neas, LPN   [id number]   Nurse Notes: None

## 2019-12-02 NOTE — Patient Instructions (Signed)
Dennis Villarreal , Thank you for taking time to come for your Medicare Wellness Visit. I appreciate your ongoing commitment to your health goals. Please review the following plan we discussed and let me know if I can assist you in the future.   Screening recommendations/referrals: Colonoscopy: Up to date, next due 08/24/2023 Recommended yearly ophthalmology/optometry visit for glaucoma screening and checkup Recommended yearly dental visit for hygiene and checkup  Vaccinations: Influenza vaccine: Up to date, next due fall 2021  Pneumococcal vaccine: Currently due for Pneumovax 23, you may receive at your next in person office visit. Tdap vaccine: Up to date, next due 04/01/2024 Shingles vaccine: Completed series     Advanced directives: Advance directive discussed with you today. Even though you declined this today please call our office should you change your mind and we can give you the proper paperwork for you to fill out.   Conditions/risks identified: None   Next appointment: 12/17/2019 @ 10:30 am with Dr. Elease Hashimoto   Preventive Care 35 Years and Older, Male Preventive care refers to lifestyle choices and visits with your health care provider that can promote health and wellness. What does preventive care include?  A yearly physical exam. This is also called an annual well check.  Dental exams once or twice a year.  Routine eye exams. Ask your health care provider how often you should have your eyes checked.  Personal lifestyle choices, including:  Daily care of your teeth and gums.  Regular physical activity.  Eating a healthy diet.  Avoiding tobacco and drug use.  Limiting alcohol use.  Practicing safe sex.  Taking low doses of aspirin every day.  Taking vitamin and mineral supplements as recommended by your health care provider. What happens during an annual well check? The services and screenings done by your health care provider during your annual well check will  depend on your age, overall health, lifestyle risk factors, and family history of disease. Counseling  Your health care provider may ask you questions about your:  Alcohol use.  Tobacco use.  Drug use.  Emotional well-being.  Home and relationship well-being.  Sexual activity.  Eating habits.  History of falls.  Memory and ability to understand (cognition).  Work and work Statistician. Screening  You may have the following tests or measurements:  Height, weight, and BMI.  Blood pressure.  Lipid and cholesterol levels. These may be checked every 5 years, or more frequently if you are over 58 years old.  Skin check.  Lung cancer screening. You may have this screening every year starting at age 64 if you have a 30-pack-year history of smoking and currently smoke or have quit within the past 15 years.  Fecal occult blood test (FOBT) of the stool. You may have this test every year starting at age 1.  Flexible sigmoidoscopy or colonoscopy. You may have a sigmoidoscopy every 5 years or a colonoscopy every 10 years starting at age 23.  Prostate cancer screening. Recommendations will vary depending on your family history and other risks.  Hepatitis C blood test.  Hepatitis B blood test.  Sexually transmitted disease (STD) testing.  Diabetes screening. This is done by checking your blood sugar (glucose) after you have not eaten for a while (fasting). You may have this done every 1-3 years.  Abdominal aortic aneurysm (AAA) screening. You may need this if you are a current or former smoker.  Osteoporosis. You may be screened starting at age 87 if you are at high risk. Talk  with your health care provider about your test results, treatment options, and if necessary, the need for more tests. Vaccines  Your health care provider may recommend certain vaccines, such as:  Influenza vaccine. This is recommended every year.  Tetanus, diphtheria, and acellular pertussis (Tdap,  Td) vaccine. You may need a Td booster every 10 years.  Zoster vaccine. You may need this after age 92.  Pneumococcal 13-valent conjugate (PCV13) vaccine. One dose is recommended after age 78.  Pneumococcal polysaccharide (PPSV23) vaccine. One dose is recommended after age 45. Talk to your health care provider about which screenings and vaccines you need and how often you need them. This information is not intended to replace advice given to you by your health care provider. Make sure you discuss any questions you have with your health care provider. Document Released: 02/06/2015 Document Revised: 09/30/2015 Document Reviewed: 11/11/2014 Elsevier Interactive Patient Education  2017 [address] Prevention in the Home Falls can cause injuries. They can happen to people of all ages. There are many things you can do to make your home safe and to help prevent falls. What can I do on the outside of my home?  Regularly fix the edges of walkways and driveways and fix any cracks.  Remove anything that might make you trip as you walk through a door, such as a raised step or threshold.  Trim any bushes or trees on the path to your home.  Use bright outdoor lighting.  Clear any walking paths of anything that might make someone trip, such as rocks or tools.  Regularly check to see if handrails are loose or broken. Make sure that both sides of any steps have handrails.  Any raised decks and porches should have guardrails on the edges.  Have any leaves, snow, or ice cleared regularly.  Use sand or salt on walking paths during winter.  Clean up any spills in your garage right away. This includes oil or grease spills. What can I do in the bathroom?  Use night lights.  Install grab bars by the toilet and in the tub and shower. Do not use towel bars as grab bars.  Use non-skid mats or decals in the tub or shower.  If you need to sit down in the shower, use a plastic, non-slip  stool.  Keep the floor dry. Clean up any water that spills on the floor as soon as it happens.  Remove soap buildup in the tub or shower regularly.  Attach bath mats securely with double-sided non-slip rug tape.  Do not have throw rugs and other things on the floor that can make you trip. What can I do in the bedroom?  Use night lights.  Make sure that you have a light by your bed that is easy to reach.  Do not use any sheets or blankets that are too big for your bed. They should not hang down onto the floor.  Have a firm chair that has side arms. You can use this for support while you get dressed.  Do not have throw rugs and other things on the floor that can make you trip. What can I do in the kitchen?  Clean up any spills right away.  Avoid walking on wet floors.  Keep items that you use a lot in easy-to-reach places.  If you need to reach something above you, use a strong step stool that has a grab bar.  Keep electrical cords out of the way.  Do  not use floor polish or wax that makes floors slippery. If you must use wax, use non-skid floor wax.  Do not have throw rugs and other things on the floor that can make you trip. What can I do with my stairs?  Do not leave any items on the stairs.  Make sure that there are handrails on both sides of the stairs and use them. Fix handrails that are broken or loose. Make sure that handrails are as long as the stairways.  Check any carpeting to make sure that it is firmly attached to the stairs. Fix any carpet that is loose or worn.  Avoid having throw rugs at the top or bottom of the stairs. If you do have throw rugs, attach them to the floor with carpet tape.  Make sure that you have a light switch at the top of the stairs and the bottom of the stairs. If you do not have them, ask someone to add them for you. What else can I do to help prevent falls?  Wear shoes that:  Do not have high heels.  Have rubber bottoms.  Are  comfortable and fit you well.  Are closed at the toe. Do not wear sandals.  If you use a stepladder:  Make sure that it is fully opened. Do not climb a closed stepladder.  Make sure that both sides of the stepladder are locked into place.  Ask someone to hold it for you, if possible.  Clearly mark and make sure that you can see:  Any grab bars or handrails.  First and last steps.  Where the edge of each step is.  Use tools that help you move around (mobility aids) if they are needed. These include:  Canes.  Walkers.  Scooters.  Crutches.  Turn on the lights when you go into a dark area. Replace any light bulbs as soon as they burn out.  Set up your furniture so you have a clear path. Avoid moving your furniture around.  If any of your floors are uneven, fix them.  If there are any pets around you, be aware of where they are.  Review your medicines with your doctor. Some medicines can make you feel dizzy. This can increase your chance of falling. Ask your doctor what other things that you can do to help prevent falls. This information is not intended to replace advice given to you by your health care provider. Make sure you discuss any questions you have with your health care provider. Document Released: 11/06/2008 Document Revised: 06/18/2015 Document Reviewed: 02/14/2014 Elsevier Interactive Patient Education  2017 [employer].

## 2019-12-04 ENCOUNTER — Telehealth: Payer: Self-pay | Admitting: Family Medicine

## 2019-12-04 NOTE — Telephone Encounter (Signed)
The patient called wanting to talk to [facility]. He had an appointment with her on 11/08 and just had a few questions to ask her.  He would like for Ofilia Neas to call him in between 9 AM - 11 AM  Please advise

## 2019-12-05 NOTE — Telephone Encounter (Signed)
Left message return call

## 2019-12-17 ENCOUNTER — Other Ambulatory Visit: Payer: Self-pay

## 2019-12-17 ENCOUNTER — Telehealth (INDEPENDENT_AMBULATORY_CARE_PROVIDER_SITE_OTHER): Payer: Medicare HMO | Admitting: Family Medicine

## 2019-12-17 DIAGNOSIS — R0789 Other chest pain: Secondary | ICD-10-CM

## 2019-12-17 DIAGNOSIS — R432 Parageusia: Secondary | ICD-10-CM | POA: Diagnosis not present

## 2019-12-17 DIAGNOSIS — R519 Headache, unspecified: Secondary | ICD-10-CM | POA: Diagnosis not present

## 2019-12-17 NOTE — Progress Notes (Signed)
Patient ID: Dennis Villarreal, male   DOB: [birth year], 69 y.o.   MRN: [id number]  This visit type was conducted due to national recommendations for restrictions regarding the COVID-19 pandemic in an effort to limit this patient's exposure and mitigate transmission in our community.   Virtual Visit via Telephone Note  I connected with Dennis Villarreal on 12/17/19 at 10:30 AM EST by telephone and verified that I am speaking with the correct person using two identifiers.   I discussed the limitations, risks, security and privacy concerns of performing an evaluation and management service by telephone and the availability of in person appointments. I also discussed with the patient that there may be a patient responsible charge related to this service. The patient expressed understanding and agreed to proceed.  Location patient: home Location provider: work or home office Participants present for the call: patient, provider Patient did not have a visit in the prior 7 days to address this/these issue(s).   History of Present Illness:  Dennis Villarreal was scheduled for physical today.  And prescreening questions though he reported some loss of taste this past Sunday.  This apparently was just for 1 meal and has had none whatsoever since then.  Not aware of any loss of smell.  Has not had any fever or cough.  No nasal congestion.  He also describes some headaches and these have been mostly occipital and bilateral and sharp and worse with neck movement.  Denies any upper extremity numbness or weakness.  He also describes some left chest wall pains worse with movement such as turning over in bed.  No recent exertional chest symptoms.  No dyspnea.  Has also had some pelvic and groin pains and upper thigh pain.  No recent fall or injury.  He has had his Covid vaccines.  No known sick exposures.  Past Medical History:  Diagnosis Date  . ALLERGIC RHINITIS 09/04/2009  . Basal cell carcinoma   . Cancer ([place]) 2012    melanoma  . DYSLIPIDEMIA 09/04/2009  . GERD 09/04/2009  . High cholesterol    just above borderline per patient    Past Surgical History:  Procedure Laterality Date  . GALLBLADDER SURGERY  01/2018  . [place], 2009   left shoulder scromoplasty  . TONSILLECTOMY    . TRIGGER FINGER RELEASE Left     reports that he quit smoking about 37 years ago. His smoking use included cigarettes. He has a 20.00 pack-year smoking history. He has never used smokeless tobacco. He reports current alcohol use. He reports that he does not use drugs. family history includes Glaucoma in his brother and father; Heart disease in his mother; Hypertension in his mother; Melanoma in his father; Stroke in his mother. Allergies  Allergen Reactions  . Levofloxacin     REACTION: hives and GI upset  . Other Hives    levaquin      Observations/Objective: Patient sounds cheerful and well on the phone. I do not appreciate any SOB. Speech and thought processing are grossly intact. Patient reported vitals:  Assessment and Plan:  #1 very transient loss of taste which basically just amounted to 1 meal last Sunday.  He did not have any loss of smell.  Has had absolutely no other symptoms whatsoever to suggest Covid.  -If he has any recurrent episodes recommend he go ahead and get Covid testing but this sounds like low risk  #2 recent bilateral intermittent occipital headaches.  He relates this being triggered by head  movement which suggest possible nerve root impingement.  Rescheduling physical and reassess at that time and consider cervical neck films a starting point  #3 intermittent atypical chest pain.  This awakes him some from sleep with movement but is nonexertional.  This does not sound cardiac.  He feels this is more in the pectoral region. -Consider EKG at follow-up for his physical which she will reschedule for next week  Follow Up Instructions:  -As above   [id number] 5-10 [id number] 11-20 [id number]  21-30 I did not refer this patient for an OV in the next 24 hours for this/these issue(s).  I discussed the assessment and treatment plan with the patient. The patient was provided an opportunity to ask questions and all were answered. The patient agreed with the plan and demonstrated an understanding of the instructions.   The patient was advised to call back or seek an in-person evaluation if the symptoms worsen or if the condition fails to improve as anticipated.  I provided 22 minutes of non-face-to-face time during this encounter.   Carolann Littler, MD

## 2019-12-23 ENCOUNTER — Ambulatory Visit (INDEPENDENT_AMBULATORY_CARE_PROVIDER_SITE_OTHER): Payer: Medicare HMO

## 2019-12-23 ENCOUNTER — Other Ambulatory Visit: Payer: Self-pay

## 2019-12-23 ENCOUNTER — Ambulatory Visit (INDEPENDENT_AMBULATORY_CARE_PROVIDER_SITE_OTHER): Payer: Medicare HMO | Admitting: Family Medicine

## 2019-12-23 ENCOUNTER — Encounter: Payer: Self-pay | Admitting: Family Medicine

## 2019-12-23 VITALS — BP 124/70 | HR 62 | Temp 98.4°F | Ht 66.0 in | Wt 156.1 lb

## 2019-12-23 DIAGNOSIS — R0789 Other chest pain: Secondary | ICD-10-CM

## 2019-12-23 DIAGNOSIS — M542 Cervicalgia: Secondary | ICD-10-CM | POA: Diagnosis not present

## 2019-12-23 DIAGNOSIS — R519 Headache, unspecified: Secondary | ICD-10-CM | POA: Diagnosis not present

## 2019-12-23 DIAGNOSIS — M4319 Spondylolisthesis, multiple sites in spine: Secondary | ICD-10-CM | POA: Diagnosis not present

## 2019-12-23 DIAGNOSIS — M47812 Spondylosis without myelopathy or radiculopathy, cervical region: Secondary | ICD-10-CM | POA: Diagnosis not present

## 2019-12-23 IMAGING — DX DG CERVICAL SPINE COMPLETE 4+V
6 series · 6 of 6 positions shown · non-contrast
Comparison: None.

CLINICAL DATA: Progressive neck pain, decreased range of motion for
3 weeks

EXAM:
CERVICAL SPINE - COMPLETE 4+ VIEW

[cervical spine lat]
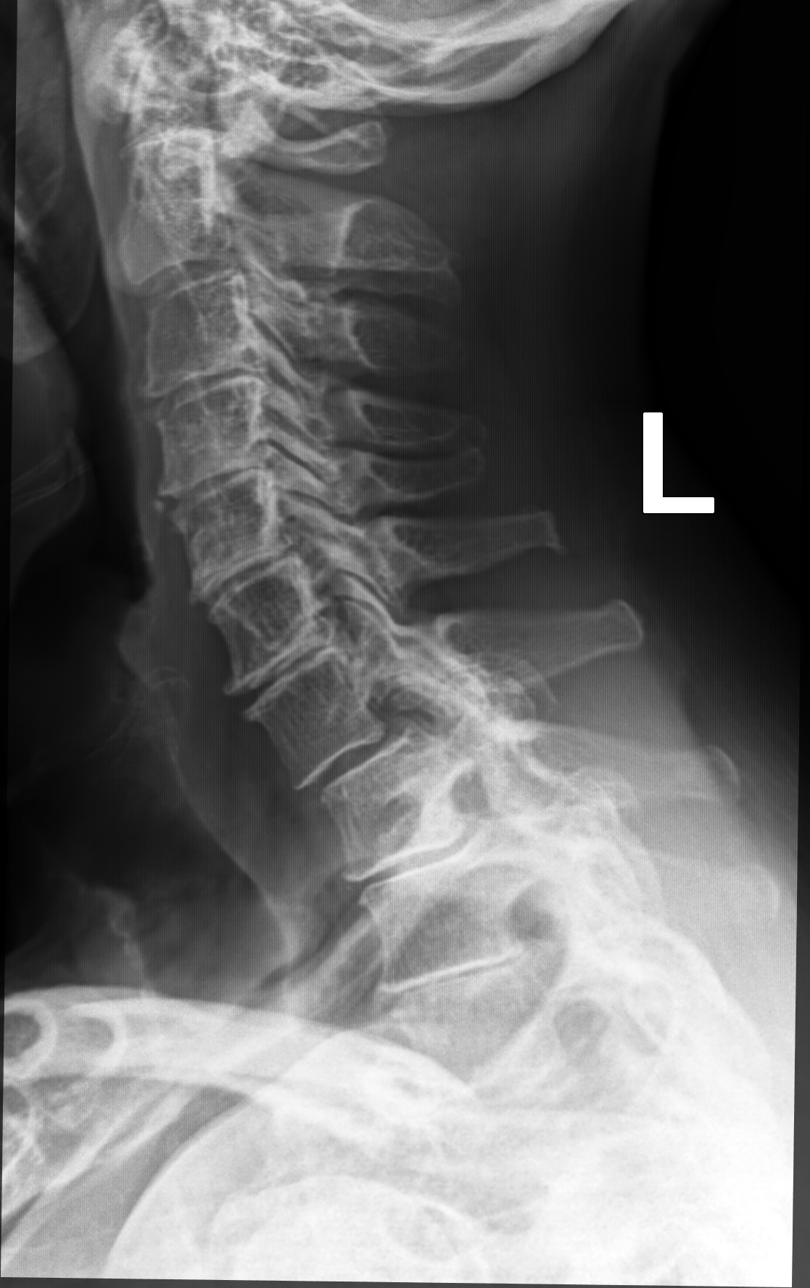

[cervical spine oblique (1 of 2)]
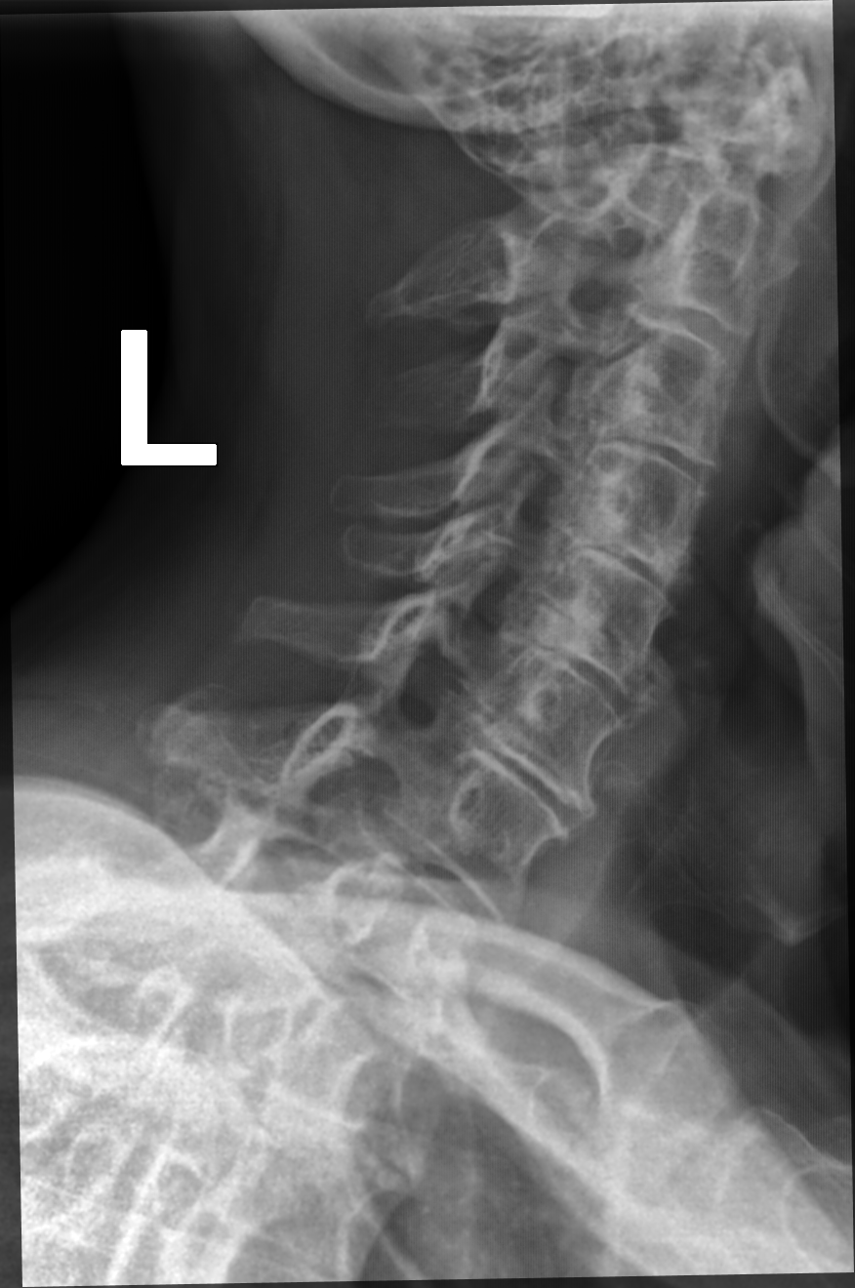

[cervical spine oblique (2 of 2)]
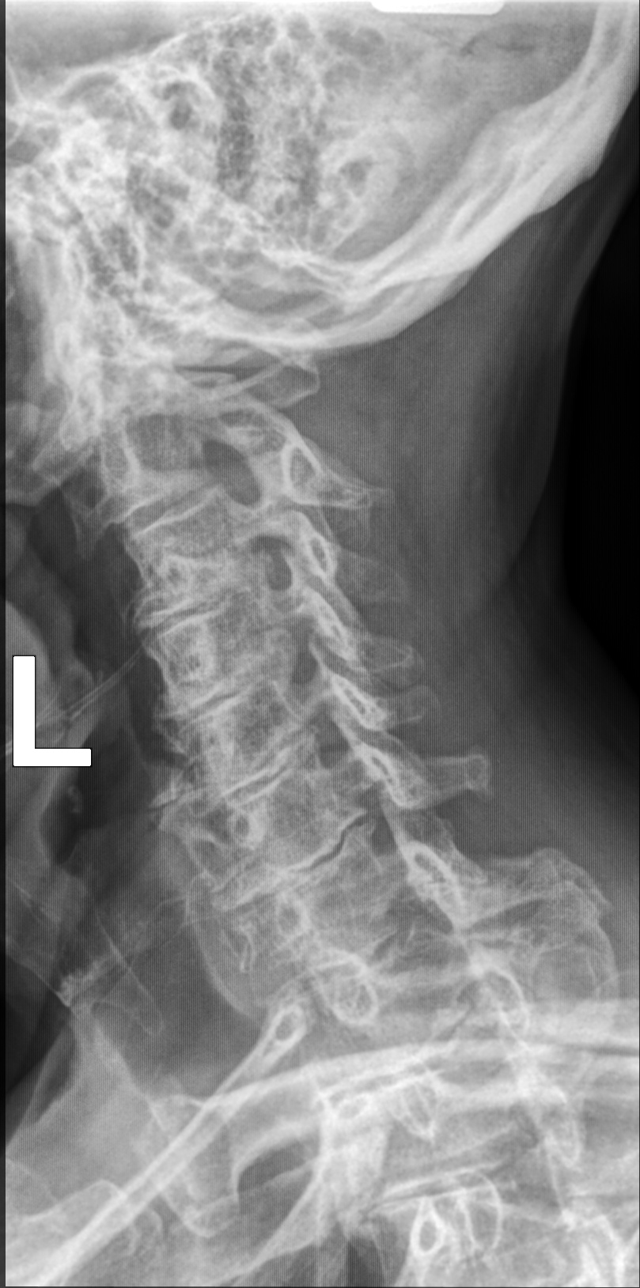

[cervical spine ap]
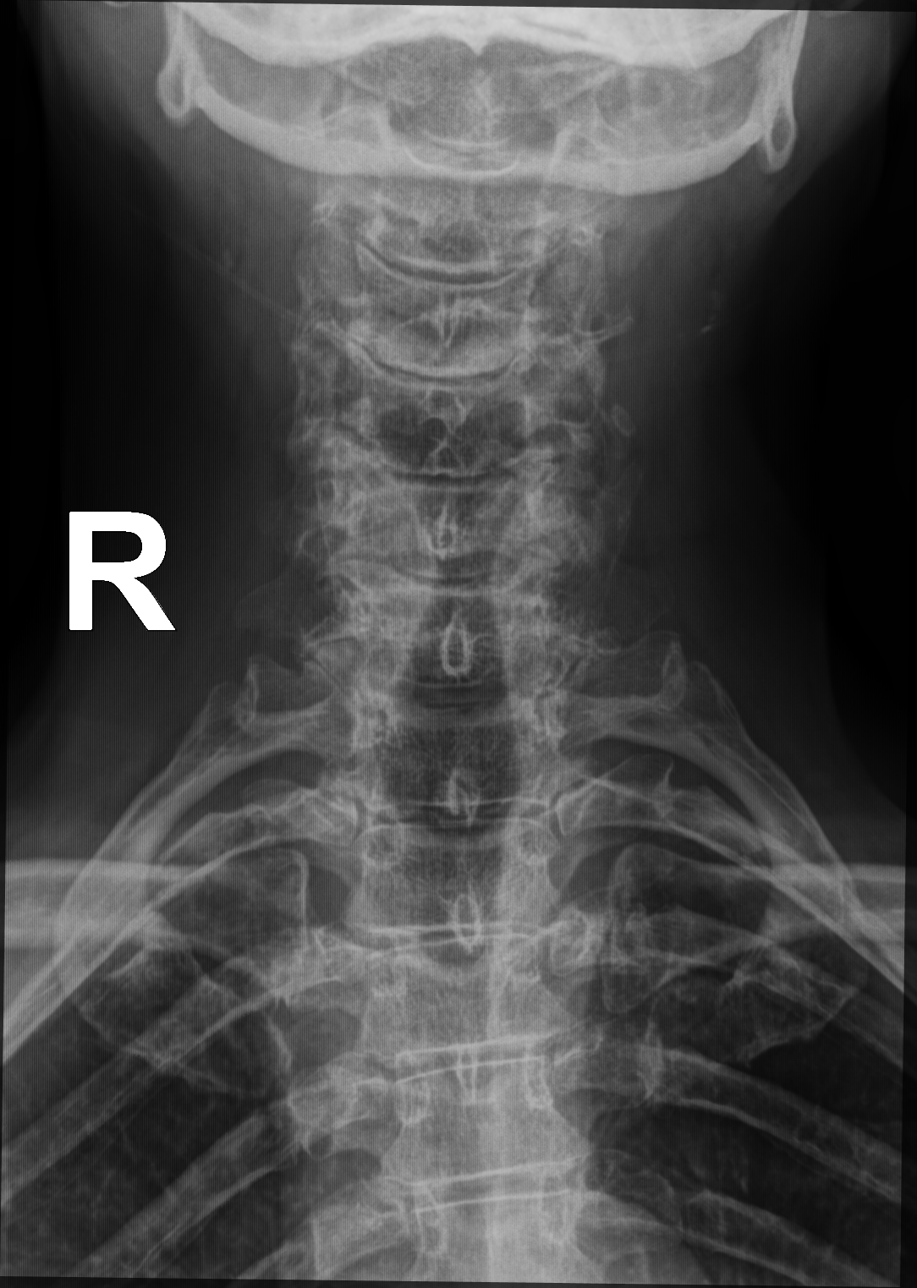

[cervical spine open mouth ap (1 of 2)]
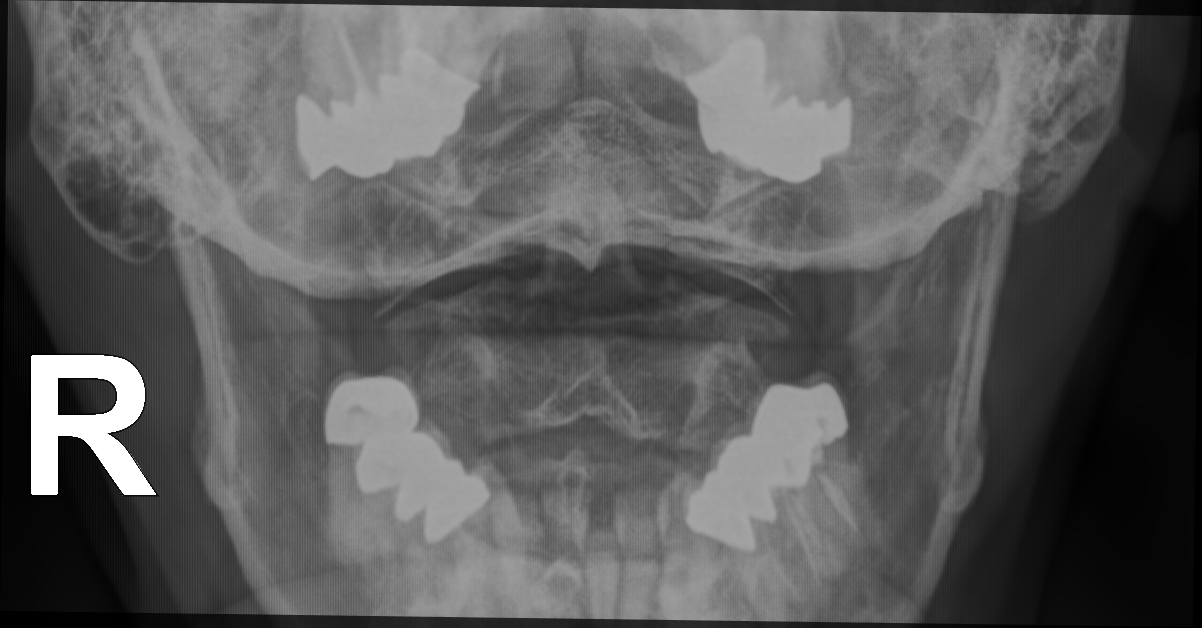

[cervical spine open mouth ap (2 of 2)]
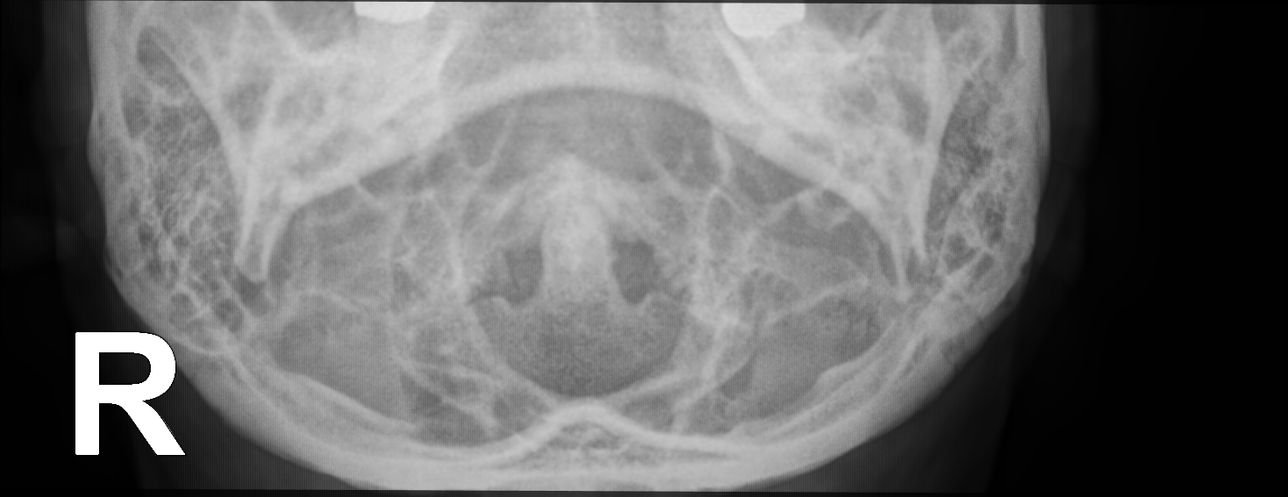

[6 of 6 positions shown; findings below may reference images not displayed]

FINDINGS: Frontal, bilateral oblique, lateral views of the cervical spine are
obtained. There is grade 1 anterolisthesis of C7 on T1. There is
extensive multilevel spondylosis and facet hypertrophy, with disc
space narrowing greatest from C3 through C7. There is bilateral
symmetrical neural foraminal encroachment at C3-4 and C4-5, with
left predominant narrowing at C6-7. Prevertebral soft tissues are
unremarkable. Airways patent. Lung apices are clear.
IMPRESSION: 1. Extensive multilevel cervical spondylosis and facet hypertrophy.
2. Anterolisthesis of C7 on T1, likely due to facet arthropathy.

## 2019-12-23 NOTE — Patient Instructions (Signed)
General Headache Without Cause A headache is pain or discomfort that is felt around the head or neck area. There are many causes and types of headaches. In some cases, the cause may not be found. Follow these instructions at home: Watch your condition for any changes. Let your doctor know about them. Take these steps to help with your condition: Managing pain      Take over-the-counter and prescription medicines only as told by your doctor.  Lie down in a dark, quiet room when you have a headache.  If told, put ice on your head and neck area: ? Put ice in a plastic bag. ? Place a towel between your skin and the bag. ? Leave the ice on for 20 minutes, 2-3 times per day.  If told, put heat on the affected area. Use the heat source that your doctor recommends, such as a moist heat pack or a heating pad. ? Place a towel between your skin and the heat source. ? Leave the heat on for 20-30 minutes. ? Remove the heat if your skin turns bright red. This is very important if you are unable to feel pain, heat, or cold. You may have a greater risk of getting burned.  Keep lights dim if bright lights bother you or make your headaches worse. Eating and drinking  Eat meals on a regular schedule.  If you drink alcohol: ? Limit how much you use to:  0-1 drink a day for women.  0-2 drinks a day for men. ? Be aware of how much alcohol is in your drink. In the [place] one drink equals one 12 oz bottle of beer (355 mL), one 5 oz glass of wine (148 mL), or one 1 oz glass of hard liquor (44 mL).  Stop drinking caffeine, or reduce how much caffeine you drink. General instructions   Keep a journal to find out if certain things bring on headaches. For example, write down: ? What you eat and drink. ? How much sleep you get. ? Any change to your diet or medicines.  Get a massage or try other ways to relax.  Limit stress.  Sit up straight. Do not tighten (tense) your muscles.  Do not use any  products that contain nicotine or tobacco. This includes cigarettes, e-cigarettes, and chewing tobacco. If you need help quitting, ask your doctor.  Exercise regularly as told by your doctor.  Get enough sleep. This often means 7-9 hours of sleep each night.  Keep all follow-up visits as told by your doctor. This is important. Contact a doctor if:  Your symptoms are not helped by medicine.  You have a headache that feels different than the other headaches.  You feel sick to your stomach (nauseous) or you throw up (vomit).  You have a fever. Get help right away if:  Your headache gets very bad quickly.  Your headache gets worse after a lot of physical activity.  You keep throwing up.  You have a stiff neck.  You have trouble seeing.  You have trouble speaking.  You have pain in the eye or ear.  Your muscles are weak or you lose muscle control.  You lose your balance or have trouble walking.  You feel like you will pass out (faint) or you pass out.  You are mixed up (confused).  You have a seizure. Summary  A headache is pain or discomfort that is felt around the head or neck area.  There are many causes and   types of headaches. In some cases, the cause may not be found.  Keep a journal to help find out what causes your headaches. Watch your condition for any changes. Let your doctor know about them.  Contact a doctor if you have a headache that is different from usual, or if your headache is not helped by medicine.  Get help right away if your headache gets very bad, you throw up, you have trouble seeing, you lose your balance, or you have a seizure. This information is not intended to replace advice given to you by your health care provider. Make sure you discuss any questions you have with your health care provider. Document Revised: 07/31/2017 Document Reviewed: 07/31/2017 Elsevier Patient Education  2020 Elsevier Inc[employer]  

## 2019-12-23 NOTE — Progress Notes (Signed)
Established Patient Office Visit  Subjective:  Patient ID: Dennis Villarreal, male    DOB: [birth year]  Age: 69 y.o. MRN: [id number]  CC:  Chief Complaint  Patient presents with  . Annual Exam    CPE, c/o left side headaches, dizzy spells x 3 weeks, pain in pelvic area.     HPI Dennis Villarreal presents for persistent headache.  He was initially scheduled for complete physical.  However, he had some recent complaints of atypical chest pain and unilateral headache and we shifted our focus to those today.  He will reschedule for physical later.  Refer to virtual visit from 12/17/2019 for details  Dennis Villarreal was scheduled for physical today.  And prescreening questions though he reported some loss of taste this past Sunday.  This apparently was just for 1 meal and has had none whatsoever since then.  Not aware of any loss of smell.  Has not had any fever or cough.  No nasal congestion.  He also describes some headaches and these have been mostly occipital and bilateral and sharp and worse with neck movement.  Denies any upper extremity numbness or weakness.  He also describes some left chest wall pains worse with movement such as turning over in bed.  No recent exertional chest symptoms.  No dyspnea.  Has also had some pelvic and groin pains and upper thigh pain.  No recent fall or injury.  He has had his Covid vaccines.  No known sick exposures.  Headaches are left occipital area and have been pretty much unilateral.  Somewhat intermittent.  Worse with head movement especially with lateral bending and rotation to the left.  His wife is a massage therapist and massage does seem to help relieve the headache.  He has not gotten any consistent relief with Tylenol or ibuprofen.  No recent injury.  He has noted decreased range of motion of the neck over the past few years.  Denies any recent loss of vision.  No temporal headache.  Chest symptoms are very atypical.  He notices relief when rubbing his anterior  chest.  He thinks this is related to some lifting he does at work.  He works part-time at the [employer] center in [employer] and frequently does lifting there.  No exertional chest tightness.  No dyspnea.  Past Medical History:  Diagnosis Date  . ALLERGIC RHINITIS 09/04/2009  . Basal cell carcinoma   . Cancer ([place]) 2012   melanoma  . DYSLIPIDEMIA 09/04/2009  . GERD 09/04/2009  . High cholesterol    just above borderline per patient     Past Surgical History:  Procedure Laterality Date  . GALLBLADDER SURGERY  01/2018  . [place], 2009   left shoulder scromoplasty  . TONSILLECTOMY    . TRIGGER FINGER RELEASE Left     Family History  Problem Relation Age of Onset  . Heart disease Mother        CVA and cardiac arrhythmia, ? atrial fib  . Stroke Mother   . Hypertension Mother   . Melanoma Father   . Glaucoma Father   . Glaucoma Brother     Social History   Socioeconomic History  . Marital status: Married    Spouse name: Not on file  . Number of children: 3  . Years of education: College  . Highest education level: Not on file  Occupational History  . Occupation: TEFL teacher      CommentProgrammer, applications  Tobacco  Use  . Smoking status: Former Smoker    Packs/day: 1.00    Years: 20.00    Pack years: 20.00    Types: Cigarettes    Quit date: 01/24/1982    Years since quitting: 37.9  . Smokeless tobacco: Never Used  Vaping Use  . Vaping Use: Never used  Substance and Sexual Activity  . Alcohol use: Yes    Alcohol/week: 0.0 standard drinks    Comment: socially  . Drug use: No  . Sexual activity: Not on file  Other Topics Concern  . Not on file  Social History Narrative   Lives at home with wife and youngest daughter.   Has 3 children.    Caffeine: 2 cups/day    Social Determinants of Health   Financial Resource Strain: Low Risk   . Difficulty of Paying Living Expenses: Not hard at all  Food Insecurity: No Food Insecurity  .  Worried About Charity fundraiser in the Last Year: Never true  . Ran Out of Food in the Last Year: Never true  Transportation Needs: No Transportation Needs  . Lack of Transportation (Medical): No  . Lack of Transportation (Non-Medical): No  Physical Activity: Inactive  . Days of Exercise per Week: 0 days  . Minutes of Exercise per Session: 0 min  Stress: No Stress Concern Present  . Feeling of Stress : Not at all  Social Connections: Moderately Integrated  . Frequency of Communication with Friends and Family: More than three times a week  . Frequency of Social Gatherings with Friends and Family: More than three times a week  . Attends Religious Services: Never  . Active Member of Clubs or Organizations: Yes  . Attends Archivist Meetings: More than 4 times per year  . Marital Status: Married  Human resources officer Violence: Not At Risk  . Fear of Current or Ex-Partner: No  . Emotionally Abused: No  . Physically Abused: No  . Sexually Abused: No    Outpatient Medications Prior to Visit  Medication Sig Dispense Refill  . atorvastatin (LIPITOR) 20 MG tablet TAKE 1 TABLET BY MOUTH EVERY DAY 90 tablet 3  . Diclofenac Sodium 3 % GEL Place 1 application onto the skin 4 (four) times daily as needed. 100 g 1  . methocarbamol (ROBAXIN) 500 MG tablet TAKE 1 TABLET (500 MG TOTAL) BY MOUTH EVERY 6 (SIX) HOURS AS NEEDED FOR MUSCLE SPASMS. 30 tablet 0  . multivitamin (ONE-A-DAY MEN'S) TABS Take 1 tablet by mouth daily.      . pantoprazole (PROTONIX) 40 MG tablet TAKE 1 TABLET BY MOUTH EVERY DAY 30 tablet 5  . psyllium (METAMUCIL) 58.6 % powder Take 1 packet by mouth daily.    . tamsulosin (FLOMAX) 0.4 MG CAPS capsule TAKE 1 CAPSULE BY MOUTH EVERY DAY 90 capsule 1  . tadalafil (CIALIS) 5 MG tablet TAKE 1 TABLET BY MOUTH EVERY DAY (Patient not taking: Reported on 12/23/2019) 30 tablet 1   No facility-administered medications prior to visit.    Allergies  Allergen Reactions  .  Levofloxacin     REACTION: hives and GI upset  . Other Hives    levaquin    ROS Review of Systems  Constitutional: Negative for appetite change, chills, fever and unexpected weight change.  Eyes: Negative for visual disturbance.  Respiratory: Negative for cough and shortness of breath.   Cardiovascular: Positive for chest pain. Negative for leg swelling.  Gastrointestinal: Negative for abdominal pain.  Neurological: Positive for headaches.  Psychiatric/Behavioral: Negative for confusion.      Objective:    Physical Exam Vitals reviewed.  Constitutional:      Appearance: Normal appearance.  HENT:     Head:     Comments: No temporal artery tenderness. Cardiovascular:     Rate and Rhythm: Normal rate and regular rhythm.  Pulmonary:     Effort: Pulmonary effort is normal.     Breath sounds: Normal breath sounds.  Musculoskeletal:     Comments: He has very limited range of motion of the cervical spine.  He is unable to rotate to the left side more than about 30 to 35 degrees.  He can rotate to the right about 45 degrees.  Very limited with lateral bending to the right or left side.  He has difficulty flexing his neck and cannot place his chin on his chest secondary to restricted range of motion.  Skin:    Findings: No rash.  Neurological:     General: No focal deficit present.     Mental Status: He is alert and oriented to person, place, and time.     Cranial Nerves: No cranial nerve deficit.     Sensory: No sensory deficit.     Motor: No weakness.  Psychiatric:        Mood and Affect: Mood normal.     BP 124/70   Pulse 62   Temp 98.4 F (36.9 C) (Oral)   Ht 5\' 6"  (1.676 m)   Wt 156 lb 1.6 oz (70.8 kg)   SpO2 98%   BMI 25.20 kg/m  Wt Readings from Last 3 Encounters:  12/23/19 156 lb 1.6 oz (70.8 kg)  04/24/19 159 lb 3.2 oz (72.2 kg)  03/26/18 163 lb 8 oz (74.2 kg)     Health Maintenance Due  Topic Date Due  . PNA vac Low Risk Adult (2 of 2 - PPSV23)  01/16/2018    There are no preventive care reminders to display for this patient.  Lab Results  Component Value Date   TSH 1.25 01/16/2017   Lab Results  Component Value Date   WBC 5.0 01/16/2017   HGB 14.8 01/16/2017   HCT 43.2 01/16/2017   MCV 93.7 01/16/2017   PLT 169.0 01/16/2017   Lab Results  Component Value Date   NA 138 01/02/2018   K 3.9 01/02/2018   CO2 31 01/02/2018   GLUCOSE 87 01/02/2018   BUN 12 01/02/2018   CREATININE 0.83 01/02/2018   BILITOT 1.0 07/13/2018   ALKPHOS 53 07/13/2018   AST 14 07/13/2018   ALT 12 07/13/2018   PROT 6.3 07/13/2018   ALBUMIN 4.3 07/13/2018   CALCIUM 9.0 01/02/2018   GFR 98.14 01/02/2018   Lab Results  Component Value Date   CHOL 133 07/13/2018   Lab Results  Component Value Date   HDL 40.20 07/13/2018   Lab Results  Component Value Date   LDLCALC 77 07/13/2018   Lab Results  Component Value Date   TRIG 78.0 07/13/2018   Lab Results  Component Value Date   CHOLHDL 3 07/13/2018   Lab Results  Component Value Date   HGBA1C 5.5 03/18/2014      Assessment & Plan:   #1 patient presents with unilateral headache mostly occipital and cervical neck pains with restricted range of motion.  No focal upper extremity weakness.  His headaches have improved with massage therapy.  This suggest likely cervical etiology  -We will check sed rate given his age but this  does not sound like temporal arteritis headaches -Start with cervical spine x-rays -Consider trial of physical therapy  #2 atypical chest pains.  This sounds likely musculoskeletal.  Does not any red flags such as dyspnea or any exertional chest pains and pains are relieved with physically rubbing the chest.  Suspect pectoral strain  No orders of the defined types were placed in this encounter.   Follow-up: No follow-ups on file.    Carolann Littler, MD

## 2019-12-25 ENCOUNTER — Telehealth: Payer: Self-pay

## 2019-12-25 NOTE — Telephone Encounter (Signed)
Informed patient of results, he would like to try physical therapy.  He would like to know what he can take for the pain?  Please advise.

## 2019-12-25 NOTE — Telephone Encounter (Signed)
Tylenol is the simplest and probably safest place to start- but don't take more than directed.  Go ahead with PT referral.

## 2019-12-25 NOTE — Telephone Encounter (Signed)
Patient says tylenol does not help.  Suggested heat, ice and ibuprofen but again patient says it does not help.

## 2019-12-25 NOTE — Telephone Encounter (Signed)
-----   Message from [name], MD sent at 12/24/2019  7:08 PM EST ----- Fairly extensive arthritis changes of cervical spine- suspect contributing to his headaches.  Recommend refer to outpatient PT if he is willing.

## 2019-12-26 ENCOUNTER — Other Ambulatory Visit: Payer: Self-pay

## 2019-12-26 DIAGNOSIS — M542 Cervicalgia: Secondary | ICD-10-CM

## 2019-12-26 MED ORDER — TRAMADOL HCL 50 MG PO TABS: 50.0000 mg | ORAL_TABLET | Freq: Four times a day (QID) | ORAL | 0 refills | Status: AC | PRN

## 2019-12-26 NOTE — Telephone Encounter (Signed)
I will send in some limited Tramadol   

## 2019-12-30 DIAGNOSIS — M542 Cervicalgia: Secondary | ICD-10-CM | POA: Diagnosis not present

## 2020-01-03 DIAGNOSIS — M542 Cervicalgia: Secondary | ICD-10-CM | POA: Diagnosis not present

## 2020-01-08 DIAGNOSIS — M542 Cervicalgia: Secondary | ICD-10-CM | POA: Diagnosis not present

## 2020-01-10 ENCOUNTER — Ambulatory Visit (INDEPENDENT_AMBULATORY_CARE_PROVIDER_SITE_OTHER): Payer: Medicare HMO | Admitting: Family Medicine

## 2020-01-10 ENCOUNTER — Other Ambulatory Visit: Payer: Self-pay

## 2020-01-10 ENCOUNTER — Ambulatory Visit (INDEPENDENT_AMBULATORY_CARE_PROVIDER_SITE_OTHER): Payer: Medicare HMO

## 2020-01-10 ENCOUNTER — Encounter: Payer: Self-pay | Admitting: Family Medicine

## 2020-01-10 VITALS — BP 102/70 | HR 69 | Temp 97.8°F | Ht 66.0 in | Wt 158.4 lb

## 2020-01-10 DIAGNOSIS — M5416 Radiculopathy, lumbar region: Secondary | ICD-10-CM

## 2020-01-10 DIAGNOSIS — M545 Low back pain, unspecified: Secondary | ICD-10-CM | POA: Diagnosis not present

## 2020-01-10 DIAGNOSIS — M542 Cervicalgia: Secondary | ICD-10-CM | POA: Diagnosis not present

## 2020-01-10 IMAGING — DX DG LUMBAR SPINE COMPLETE 4+V
5 series · 5 of 5 positions shown · non-contrast
Comparison: None.

CLINICAL DATA: Progressive low back pain.

EXAM:
LUMBAR SPINE - COMPLETE 4+ VIEW

[lumbar spine ap]
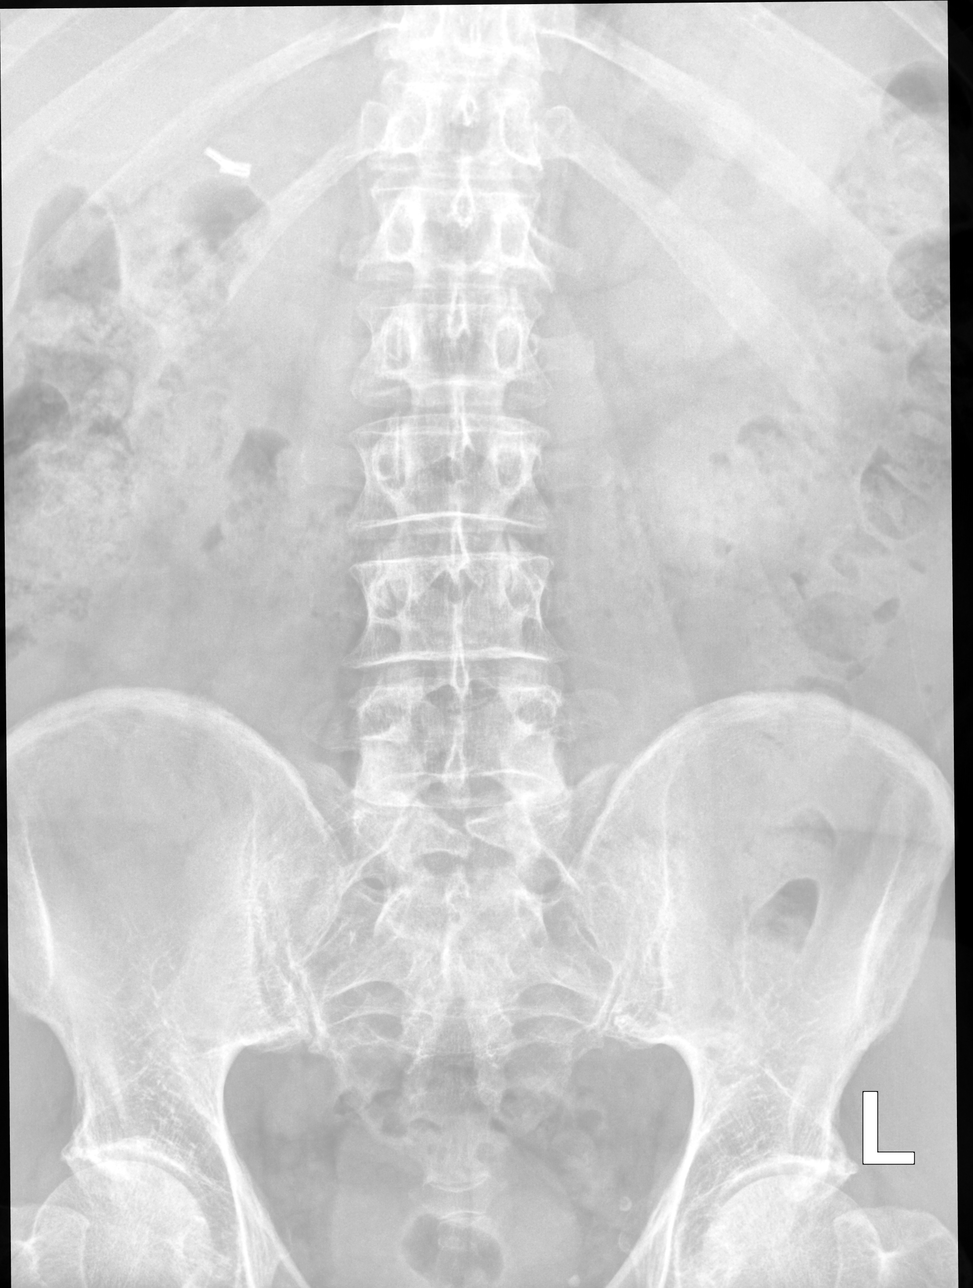

[lumbar spine oblique (1 of 2)]
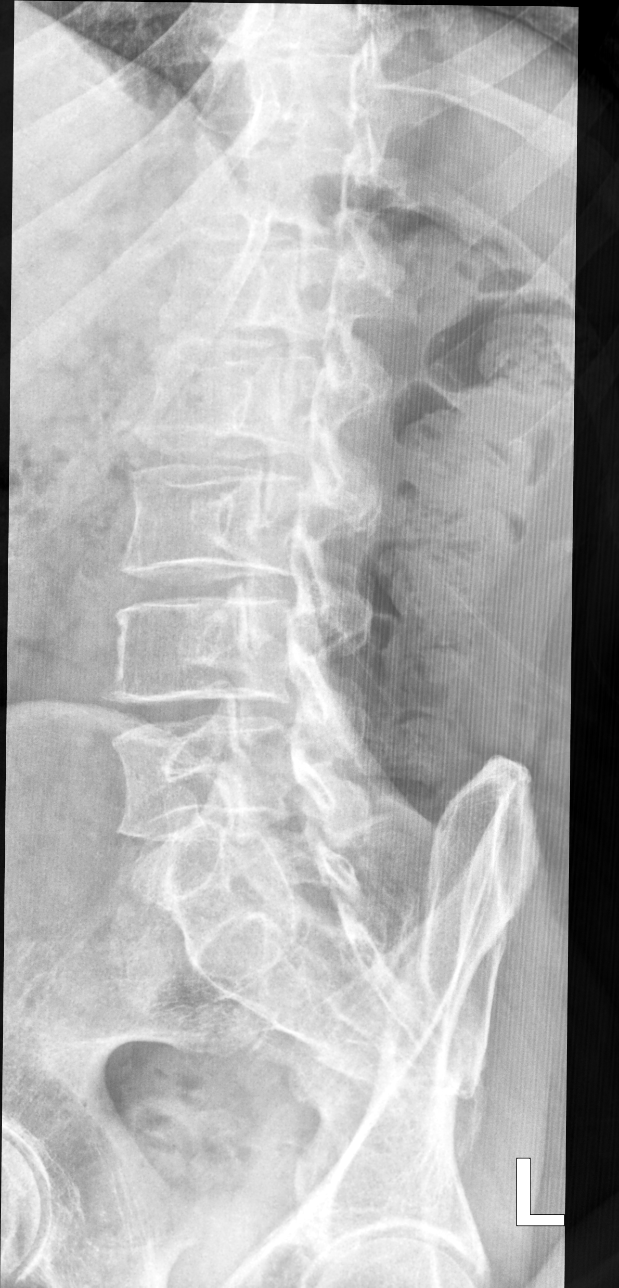

[lumbar spine oblique (2 of 2)]
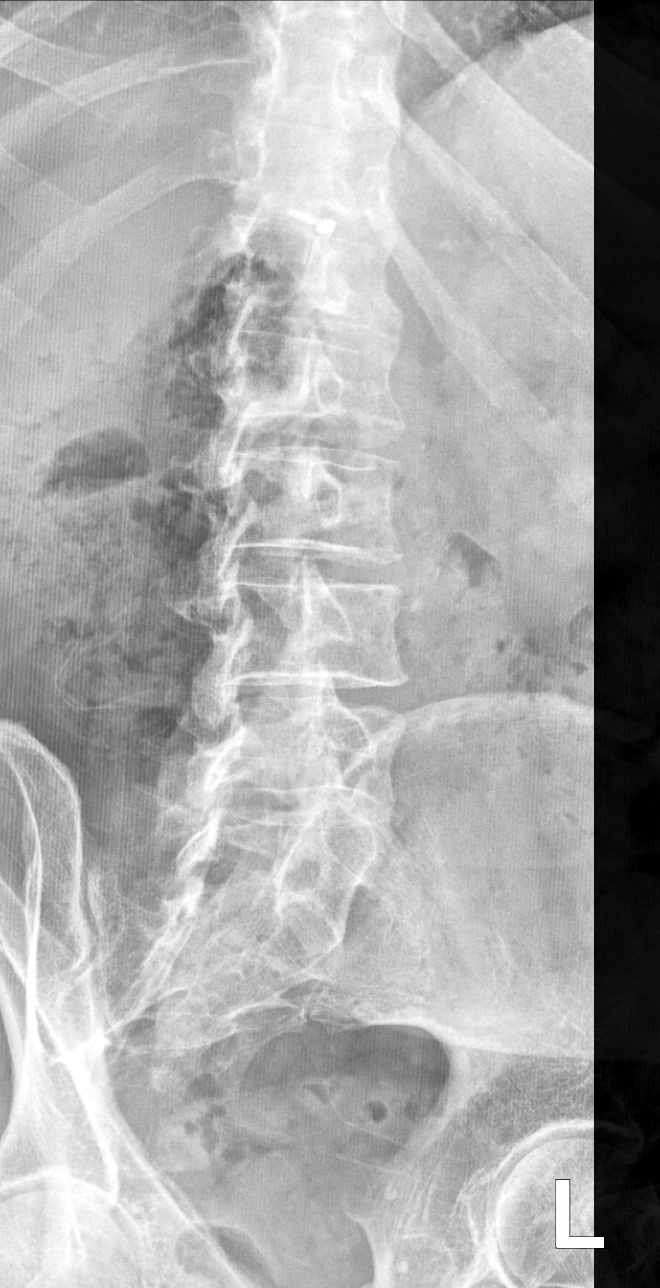

[lumbar spine lat]
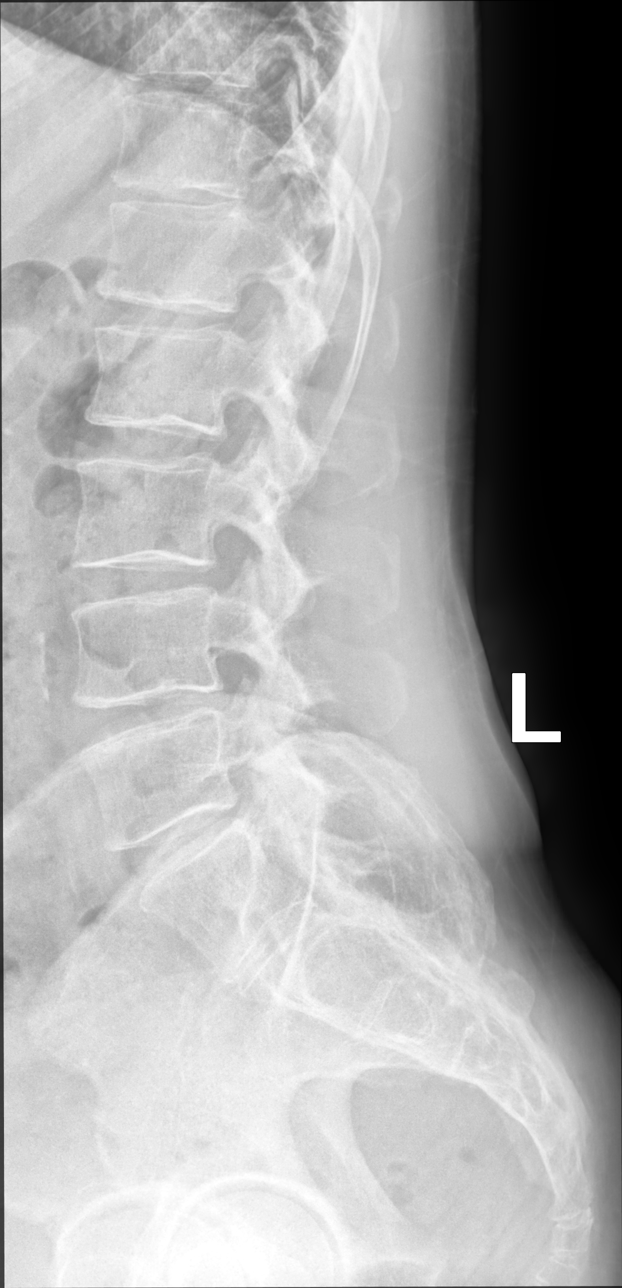

[lumbar spine lat spot]
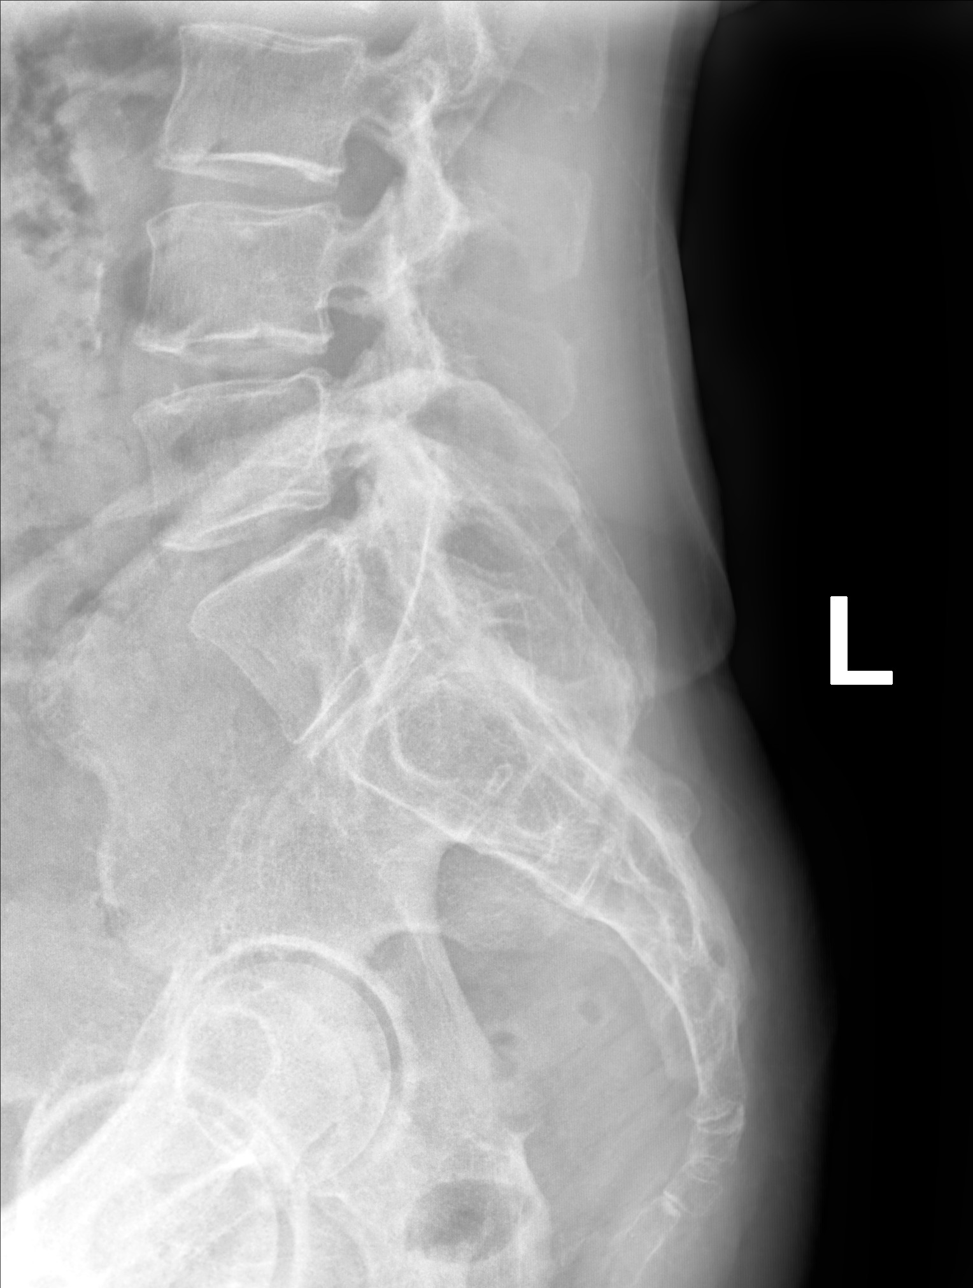

[5 of 5 positions shown; findings below may reference images not displayed]

FINDINGS: There is no acute compression fracture. There is facet arthrosis in
the lower lumbar segments. There is mild disc height loss in the
lower lumbar segments. There is no significant malalignment.
Vascular calcifications are noted of the abdominal aorta.
IMPRESSION: No acute osseous abnormality. Mild degenerative changes in the lower
lumbar spine.

## 2020-01-10 NOTE — Patient Instructions (Signed)
Lumbosacral Radiculopathy Lumbosacral radiculopathy is a condition that involves the spinal nerves and nerve roots in the low back and bottom of the spine. The condition develops when these nerves and nerve roots move out of place or become inflamed and cause symptoms. What are the causes? This condition may be caused by:  Pressure from a disk that bulges out of place (herniated disk). A disk is a plate of soft cartilage that separates bones in the spine.  Disk changes that occur with age (disk degeneration).  A narrowing of the bones of the lower back (spinal stenosis).  A tumor.  An infection.  An injury that places sudden pressure on the disks that cushion the bones of your lower spine. What increases the risk? You are more likely to develop this condition if:  You are a male who is 30-50 years old.  You are a male who is 50-60 years old.  You use improper technique when lifting things.  You are overweight or live a sedentary lifestyle.  Your work requires frequent lifting.  You smoke.  You do repetitive activities that strain the spine. What are the signs or symptoms? Symptoms of this condition include:  Pain that goes down from your back into your legs (sciatica), usually on one side of the body. This is the most common symptom. The pain may be worse with sitting, coughing, or sneezing.  Pain and numbness in your legs.  Muscle weakness.  Tingling.  Loss of bladder control or bowel control. How is this diagnosed? This condition may be diagnosed based on:  Your symptoms and medical history.  A physical exam. If the pain is lasting, you may have tests, such as:  MRI scan.  X-ray.  CT scan.  A type of X-ray used to examine the spinal canal after injecting a dye into your spine (myelogram).  A test to measure how electrical impulses move through a nerve (nerve conduction study). How is this treated? Treatment may depend on the cause of the condition and  may include:  Working with a physical therapist.  Taking pain medicine.  Applying heat and ice to affected areas.  Doing stretches to improve flexibility.  Doing exercises to strengthen back muscles.  Having chiropractic spinal manipulation.  Using transcutaneous electrical nerve stimulation (TENS) therapy.  Getting a steroid injection in the spine. In some cases, no treatment is needed. If the condition is long-lasting (chronic), or if symptoms are severe, treatment may involve surgery or lifestyle changes, such as following a weight-loss plan. Follow these instructions at home: Activity  Avoid bending and other activities that make the problem worse.  Maintain a proper position when standing or sitting: ? When standing, keep your upper back and neck straight, with your shoulders pulled back. Avoid slouching. ? When sitting, keep your back straight and relax your shoulders. Do not round your shoulders or pull them backward.  Do not sit or stand in one place for long periods of time.  Take brief periods of rest throughout the day. This will reduce your pain. It is usually better to rest by lying down or standing, not sitting.  When you are resting for longer periods, mix in some mild activity or stretching between periods of rest. This will help to prevent stiffness and pain.  Get regular exercise. Ask your health care provider what activities are safe for you. If you were shown how to do any exercises or stretches, do them as directed by your health care provider.  Do   not lift anything that is heavier than 10 lb (4.5 kg) or the limit that you are told by your health care provider. Always use proper lifting technique, which includes: ? Bending your knees. ? Keeping the load close to your body. ? Avoiding twisting. Managing pain  If directed, put ice on the affected area: ? Put ice in a plastic bag. ? Place a towel between your skin and the bag. ? Leave the ice on for 20  minutes, 2-3 times a day.  If directed, apply heat to the affected area as often as told by your health care provider. Use the heat source that your health care provider recommends, such as a moist heat pack or a heating pad. ? Place a towel between your skin and the heat source. ? Leave the heat on for 20-30 minutes. ? Remove the heat if your skin turns bright red. This is especially important if you are unable to feel pain, heat, or cold. You may have a greater risk of getting burned.  Take over-the-counter and prescription medicines only as told by your health care provider. General instructions  Sleep on a firm mattress in a comfortable position. Try lying on your side with your knees slightly bent. If you lie on your back, put a pillow under your knees.  Do not drive or use heavy machinery while taking prescription pain medicine.  If your health care provider prescribed a diet or exercise program, follow it as directed.  Keep all follow-up visits as told by your health care provider. This is important. Contact a health care provider if:  Your pain does not improve over time, even when taking pain medicines. Get help right away if:  You develop severe pain.  Your pain suddenly gets worse.  You develop increasing weakness in your legs.  You lose the ability to control your bladder or bowel.  You have difficulty walking or balancing.  You have a fever. Summary  Lumbosacral radiculopathy is a condition that occurs when the spinal nerves and nerve roots in the lower part of the spine move out of place or become inflamed and cause symptoms.  Symptoms include pain, numbness, and tingling that go down from your back into your legs (sciatica), muscle weakness, and loss of bladder control or bowel control.  If directed, apply ice or heat to the affected area as told by your health care provider.  Follow instructions about activity, rest, and proper lifting technique. This  information is not intended to replace advice given to you by your health care provider. Make sure you discuss any questions you have with your health care provider. Document Revised: 12/29/2016 Document Reviewed: 12/29/2016 Elsevier Patient Education  2020 [employer]  

## 2020-01-10 NOTE — Progress Notes (Signed)
Established Patient Office Visit  Subjective:  Patient ID: Dennis Villarreal, male    DOB: Apr 10, 1950  Age: 69 y.o. MRN: [id number]  CC:  Chief Complaint  Patient presents with  . Leg Pain    Patient complains of sudden onset of severe left posterior thigh pain, into groin and pelvis, worse now and has difficulty sleeping, uses Tylenol    HPI [name] presents for pain in his left lower extremity predominantly around the thigh region.  This is worse when lying down.  He has some occasional bilateral lower extremity pain.  Occasional lumbar pain as well.  He describes as achy pain which is worse when he is lying down.  Not exacerbated with ambulation.  He occasionally has groin pain but predominantly left posterior lateral lower extremity.  No significant numbness lower extremity.  No loss of urine or stool control.  No definite weakness.  He has had some recent headaches and has significant spondylosis of the cervical spine and is undergoing physical therapy and that seems to be helping.  Denies any recent appetite or weight changes.  Past Medical History:  Diagnosis Date  . ALLERGIC RHINITIS 09/04/2009  . Basal cell carcinoma   . Cancer ([place]) 2012   melanoma  . DYSLIPIDEMIA 09/04/2009  . GERD 09/04/2009  . High cholesterol    just above borderline per patient     Past Surgical History:  Procedure Laterality Date  . GALLBLADDER SURGERY  01/2018  . [place], 2009   left shoulder scromoplasty  . TONSILLECTOMY    . TRIGGER FINGER RELEASE Left     Family History  Problem Relation Age of Onset  . Heart disease Mother        CVA and cardiac arrhythmia, ? atrial fib  . Stroke Mother   . Hypertension Mother   . Melanoma Father   . Glaucoma Father   . Glaucoma Brother     Social History   Socioeconomic History  . Marital status: Married    Spouse name: Not on file  . Number of children: 3  . Years of education: College  . Highest education level: Not  on file  Occupational History  . Occupation: TEFL teacher      Comment: Estate manager/land agent  Tobacco Use  . Smoking status: Former Smoker    Packs/day: 1.00    Years: 20.00    Pack years: 20.00    Types: Cigarettes    Quit date: 01/24/1982    Years since quitting: 37.9  . Smokeless tobacco: Never Used  Vaping Use  . Vaping Use: Never used  Substance and Sexual Activity  . Alcohol use: Yes    Alcohol/week: 0.0 standard drinks    Comment: socially  . Drug use: No  . Sexual activity: Not on file  Other Topics Concern  . Not on file  Social History Narrative   Lives at home with wife and youngest daughter.   Has 3 children.    Caffeine: 2 cups/day    Social Determinants of Health   Financial Resource Strain: Low Risk   . Difficulty of Paying Living Expenses: Not hard at all  Food Insecurity: No Food Insecurity  . Worried About Charity fundraiser in the Last Year: Never true  . Ran Out of Food in the Last Year: Never true  Transportation Needs: No Transportation Needs  . Lack of Transportation (Medical): No  . Lack of Transportation (Non-Medical): No  Physical Activity: Inactive  .  Days of Exercise per Week: 0 days  . Minutes of Exercise per Session: 0 min  Stress: No Stress Concern Present  . Feeling of Stress : Not at all  Social Connections: Moderately Integrated  . Frequency of Communication with Friends and Family: More than three times a week  . Frequency of Social Gatherings with Friends and Family: More than three times a week  . Attends Religious Services: Never  . Active Member of Clubs or Organizations: Yes  . Attends Archivist Meetings: More than 4 times per year  . Marital Status: Married  Human resources officer Violence: Not At Risk  . Fear of Current or Ex-Partner: No  . Emotionally Abused: No  . Physically Abused: No  . Sexually Abused: No    Outpatient Medications Prior to Visit  Medication Sig Dispense Refill  . atorvastatin  (LIPITOR) 20 MG tablet TAKE 1 TABLET BY MOUTH EVERY DAY 90 tablet 3  . multivitamin (ONE-A-DAY MEN'S) TABS Take 1 tablet by mouth daily.    . pantoprazole (PROTONIX) 40 MG tablet TAKE 1 TABLET BY MOUTH EVERY DAY 30 tablet 5  . psyllium (METAMUCIL) 58.6 % powder Take 1 packet by mouth daily.    . tadalafil (CIALIS) 5 MG tablet TAKE 1 TABLET BY MOUTH EVERY DAY 30 tablet 1  . tamsulosin (FLOMAX) 0.4 MG CAPS capsule TAKE 1 CAPSULE BY MOUTH EVERY DAY 90 capsule 1  . Diclofenac Sodium 3 % GEL Place 1 application onto the skin 4 (four) times daily as needed. 100 g 1  . methocarbamol (ROBAXIN) 500 MG tablet TAKE 1 TABLET (500 MG TOTAL) BY MOUTH EVERY 6 (SIX) HOURS AS NEEDED FOR MUSCLE SPASMS. 30 tablet 0   No facility-administered medications prior to visit.    Allergies  Allergen Reactions  . Levofloxacin     REACTION: hives and GI upset  . Other Hives    levaquin    ROS Review of Systems  Constitutional: Negative for activity change, appetite change and fever.  Respiratory: Negative for cough and shortness of breath.   Cardiovascular: Negative for chest pain and leg swelling.  Gastrointestinal: Negative for abdominal pain and vomiting.  Genitourinary: Negative for dysuria, flank pain and hematuria.  Musculoskeletal: Positive for back pain. Negative for joint swelling.  Neurological: Negative for weakness and numbness.      Objective:    Physical Exam Constitutional:      General: He is not in acute distress.    Appearance: He is well-developed and well-nourished.  Neck:     Thyroid: No thyromegaly.  Cardiovascular:     Rate and Rhythm: Normal rate and regular rhythm.     Heart sounds: Normal heart sounds. No murmur heard.   Pulmonary:     Effort: Pulmonary effort is normal. No respiratory distress.     Breath sounds: Normal breath sounds. No wheezing or rales.  Musculoskeletal:        General: No edema.  Skin:    Findings: No rash.  Neurological:     Mental Status: He  is alert and oriented to person, place, and time.     Cranial Nerves: No cranial nerve deficit.     Deep Tendon Reflexes: Reflexes are normal and symmetric.     BP 102/70 (BP Location: Left Arm, Patient Position: Sitting, Cuff Size: Normal)   Pulse 69   Temp 97.8 F (36.6 C) (Oral)   Ht 5\' 6"  (1.676 m)   Wt 158 lb 6.4 oz (71.8 kg)   BMI  25.57 kg/m  Wt Readings from Last 3 Encounters:  01/10/20 158 lb 6.4 oz (71.8 kg)  12/23/19 156 lb 1.6 oz (70.8 kg)  04/24/19 159 lb 3.2 oz (72.2 kg)     Health Maintenance Due  Topic Date Due  . PNA vac Low Risk Adult (2 of 2 - PPSV23) 01/16/2018    There are no preventive care reminders to display for this patient.  Lab Results  Component Value Date   TSH 1.25 01/16/2017   Lab Results  Component Value Date   WBC 5.0 01/16/2017   HGB 14.8 01/16/2017   HCT 43.2 01/16/2017   MCV 93.7 01/16/2017   PLT 169.0 01/16/2017   Lab Results  Component Value Date   NA 138 01/02/2018   K 3.9 01/02/2018   CO2 31 01/02/2018   GLUCOSE 87 01/02/2018   BUN 12 01/02/2018   CREATININE 0.83 01/02/2018   BILITOT 1.0 07/13/2018   ALKPHOS 53 07/13/2018   AST 14 07/13/2018   ALT 12 07/13/2018   PROT 6.3 07/13/2018   ALBUMIN 4.3 07/13/2018   CALCIUM 9.0 01/02/2018   GFR 98.14 01/02/2018   Lab Results  Component Value Date   CHOL 133 07/13/2018   Lab Results  Component Value Date   HDL 40.20 07/13/2018   Lab Results  Component Value Date   LDLCALC 77 07/13/2018   Lab Results  Component Value Date   TRIG 78.0 07/13/2018   Lab Results  Component Value Date   CHOLHDL 3 07/13/2018   Lab Results  Component Value Date   HGBA1C 5.5 03/18/2014      Assessment & Plan:   Problem List Items Addressed This Visit   None   Visit Diagnoses    Lumbar radiculitis    -  Primary   Relevant Orders   DG Lumbar Spine Complete    Patient relates several month history of pain predominantly left lower extremity.  This sounds to be in a sciatic  distribution.  Straight leg raises are negative.  Does not any red flags such as fever, appetite change, weight loss  -Given duration of symptoms and no response with conservative over-the-counter medications will get some plain films to start with.  Will probably need MRI lumbar spine to further clarify.  Follow-up promptly for any weakness progressive numbness or other concerns  No orders of the defined types were placed in this encounter.   Follow-up: No follow-ups on file.    Carolann Littler, MD

## 2020-01-13 ENCOUNTER — Telehealth: Payer: Self-pay | Admitting: Family Medicine

## 2020-01-13 DIAGNOSIS — M5416 Radiculopathy, lumbar region: Secondary | ICD-10-CM

## 2020-01-13 NOTE — Telephone Encounter (Signed)
Patient is calling and wanted to see if provider will order a MRI for his back pain, please advise. CB is [phone]

## 2020-01-13 NOTE — Telephone Encounter (Signed)
MRI has been ordered

## 2020-01-14 NOTE — Progress Notes (Signed)
Mychart message sent: Only mild degenerative changes of lumbar spine.  MRI lumbar spine has been ordered.

## 2020-01-16 DIAGNOSIS — M542 Cervicalgia: Secondary | ICD-10-CM | POA: Diagnosis not present

## 2020-01-27 ENCOUNTER — Telehealth: Payer: Self-pay | Admitting: Family Medicine

## 2020-01-27 NOTE — Telephone Encounter (Signed)
Pt is calling in stating that he would like to have ibuprofen for the pain in his neck.  Pharm:  [facility] [address] ([address]  Pt would like to have a call if something is called in.

## 2020-01-28 MED ORDER — IBUPROFEN 800 MG PO TABS: 800.0000 mg | ORAL_TABLET | Freq: Three times a day (TID) | ORAL | 0 refills | Status: DC | PRN

## 2020-01-28 NOTE — Telephone Encounter (Signed)
Can send in limited ibuprofen 800 mg 1 every 8 hours as needed with food.  Do not recommend prolonged use given his age and high risk for GI problems such as ulcer.  Dispense #30

## 2020-01-28 NOTE — Telephone Encounter (Signed)
Ibuprofen sent to pharmacy.  Patient aware.

## 2020-01-28 NOTE — Telephone Encounter (Signed)
Patient is taking 400-600 mg ibuprofen as needed for pain.  He is also doing physical therapy.  It does help some, patient would like a stronger dose if possible.

## 2020-02-03 ENCOUNTER — Other Ambulatory Visit: Payer: Self-pay

## 2020-02-03 MED ORDER — TADALAFIL 5 MG PO TABS: 5.0000 mg | ORAL_TABLET | Freq: Every day | ORAL | 1 refills | Status: DC

## 2020-02-17 ENCOUNTER — Other Ambulatory Visit: Payer: Medicare HMO

## 2020-03-02 DIAGNOSIS — M542 Cervicalgia: Secondary | ICD-10-CM | POA: Diagnosis not present

## 2020-03-10 ENCOUNTER — Other Ambulatory Visit: Payer: Medicare HMO

## 2020-03-15 ENCOUNTER — Other Ambulatory Visit: Payer: Medicare HMO

## 2020-03-15 ENCOUNTER — Other Ambulatory Visit: Payer: Self-pay | Admitting: Family Medicine

## 2020-03-16 ENCOUNTER — Ambulatory Visit
Admission: RE | Admit: 2020-03-16 | Discharge: 2020-03-16 | Disposition: A | Discharge status: Home / Self Care | Payer: Medicare HMO | Source: Ambulatory Visit | Attending: Family Medicine | Admitting: Family Medicine

## 2020-03-16 ENCOUNTER — Other Ambulatory Visit: Payer: Self-pay

## 2020-03-16 DIAGNOSIS — M542 Cervicalgia: Secondary | ICD-10-CM | POA: Diagnosis not present

## 2020-03-16 DIAGNOSIS — M5416 Radiculopathy, lumbar region: Secondary | ICD-10-CM

## 2020-03-16 DIAGNOSIS — M545 Low back pain, unspecified: Secondary | ICD-10-CM | POA: Diagnosis not present

## 2020-03-16 DIAGNOSIS — M48061 Spinal stenosis, lumbar region without neurogenic claudication: Secondary | ICD-10-CM | POA: Diagnosis not present

## 2020-03-16 IMAGING — MR MR LUMBAR SPINE W/O CM
4 of 5 series · 25 of 48 positions shown · non-contrast
Comparison: Lumbar radiographs [DATE].

CLINICAL DATA: 69-year-old male with persistent pain radiating to
the bilateral groin and leg. No known injury.

EXAM:
MRI LUMBAR SPINE WITHOUT CONTRAST
TECHNIQUE: Multiplanar, multisequence MR imaging of the lumbar spine was
performed. No intravenous contrast was administered.

[Series 3: T2 · sagittal · 4.0mm · 0.57mm/px · 6 of 16 slices shown (1 of 2)]
[im 1/16]
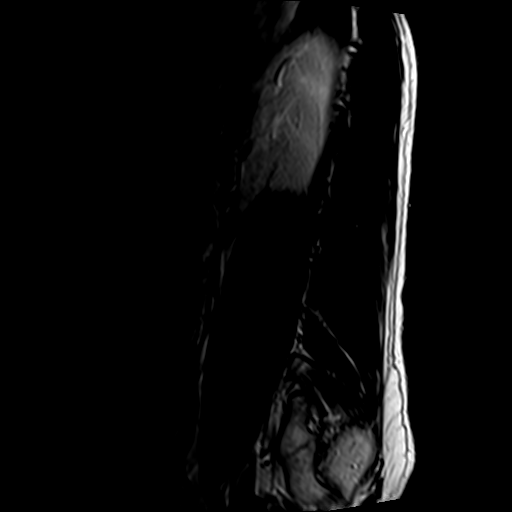
[im 4/16]
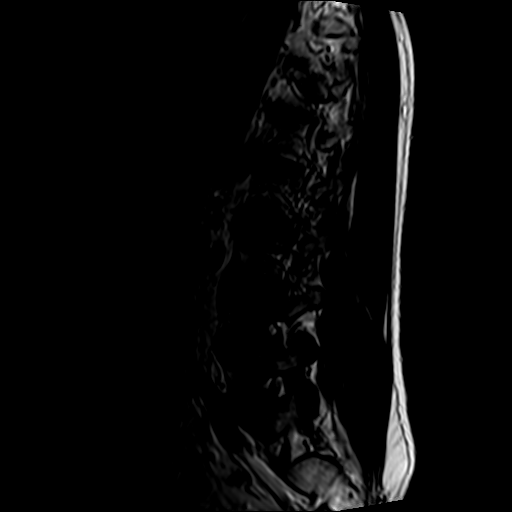
[im 7/16]
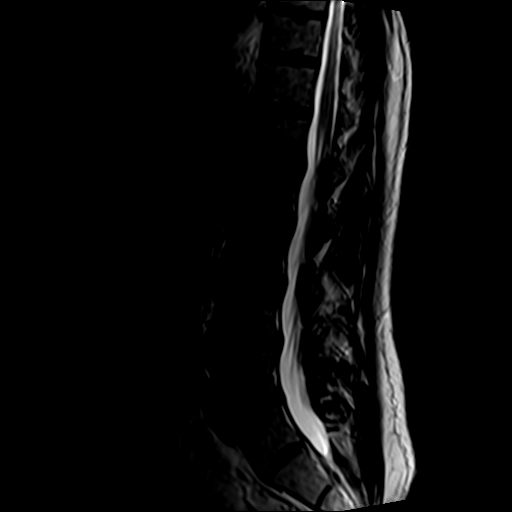
[im 10/16]
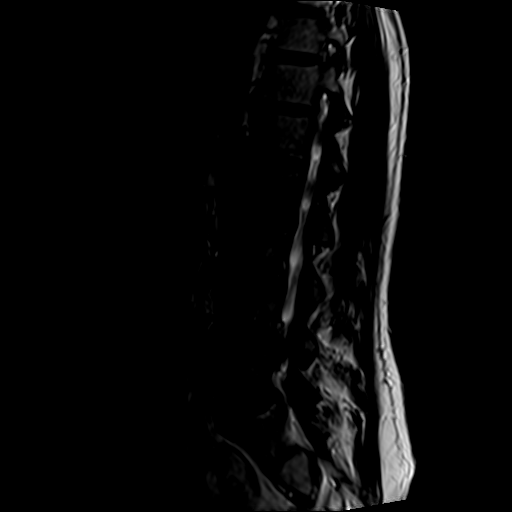
[im 13/16]
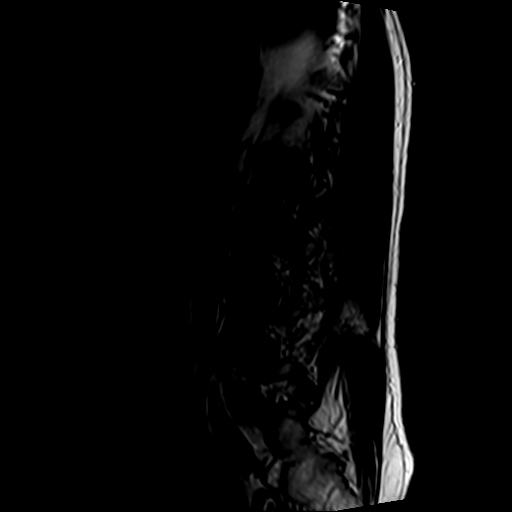
[im 16/16]
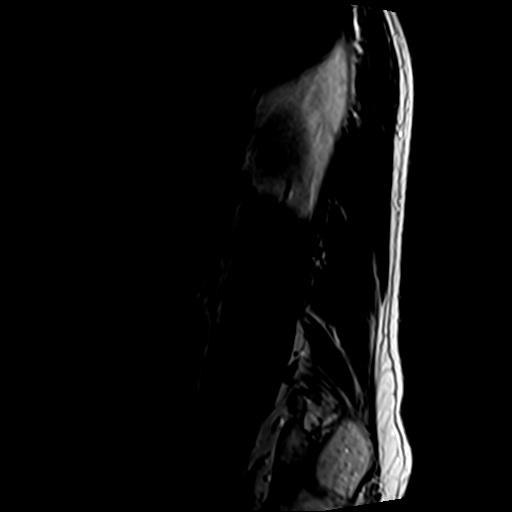

[Series 5: T1 · sagittal · 4.0mm · 0.53mm/px · 6 of 16 slices shown (1 of 2)]
[im 1/16]
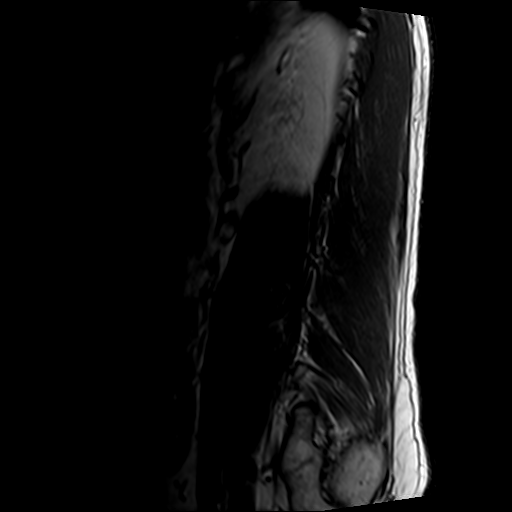
[im 4/16]
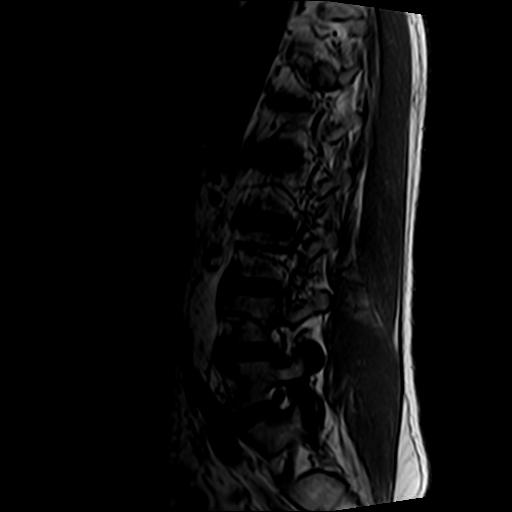
[im 7/16]
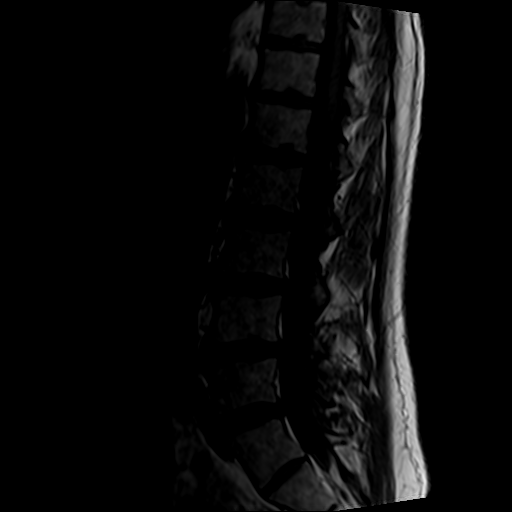
[im 10/16]
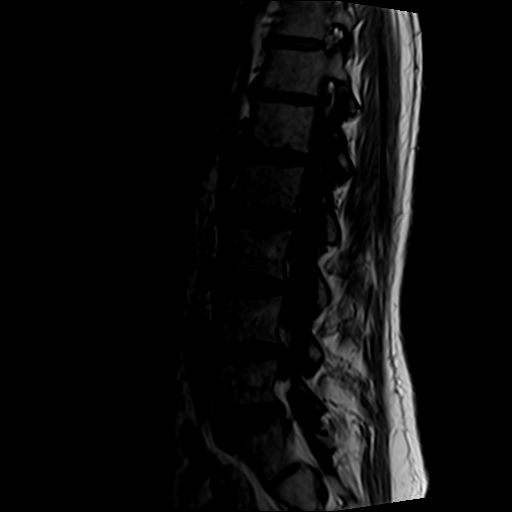
[im 13/16]
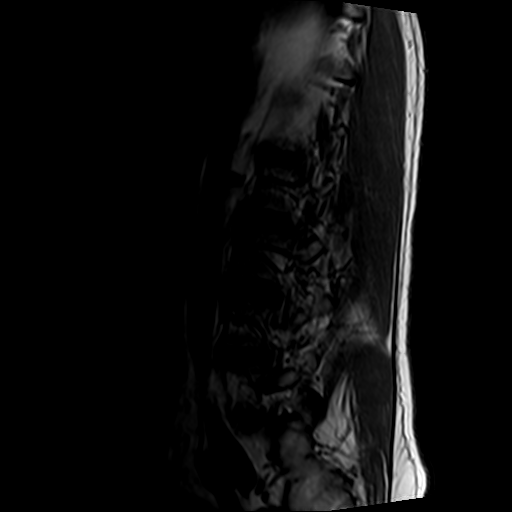
[im 16/16]
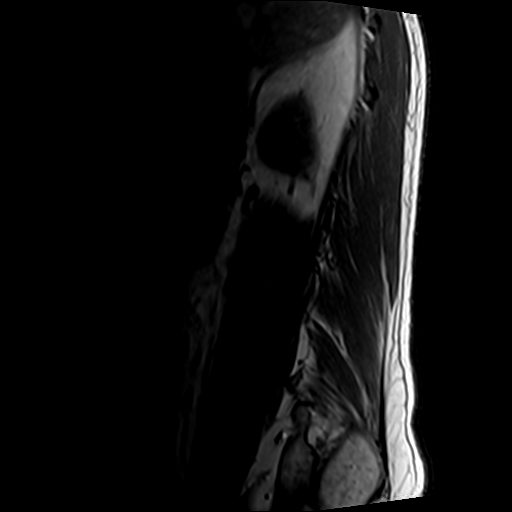

[Series 6: T2 · axial · 4.0mm · 0.70mm/px · z∈[-89,+126]mm · 9 of 38 slices shown (2 of 2)]
[im 1/38]
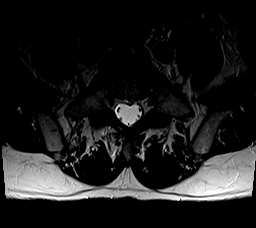
[im 6/38]
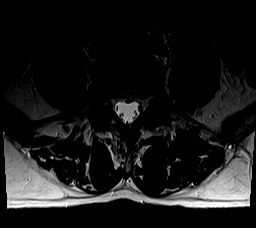
[im 11/38]
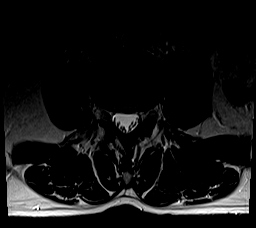
[im 16/38]
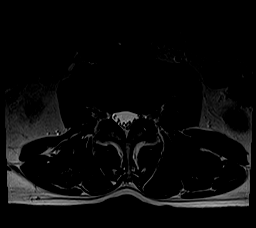
[im 19/38]
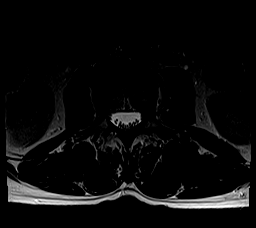
[im 22/38]
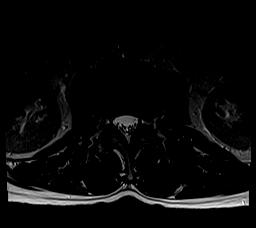
[im 27/38]
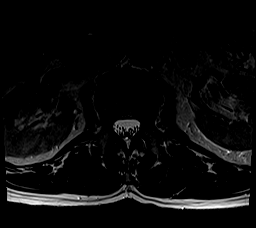
[im 32/38]
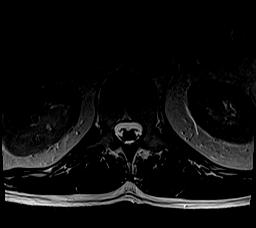
[im 38/38]
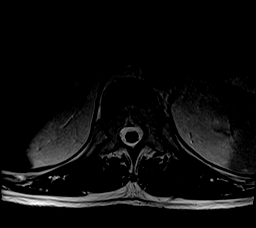

[Series 7: T1 · axial · 4.0mm · 0.35mm/px · z∈[-89,+95]mm · 4 of 38 slices shown (2 of 2)]
[im 1/38]
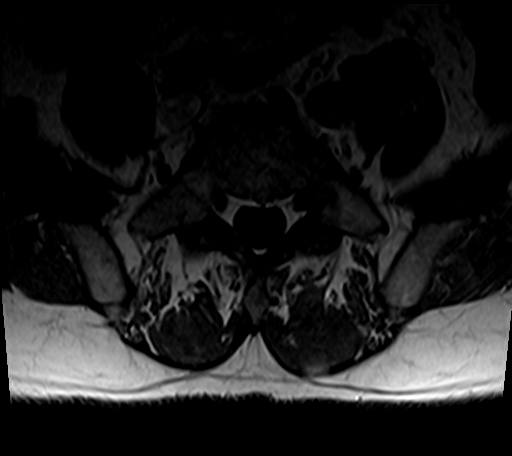
[im 6/38]
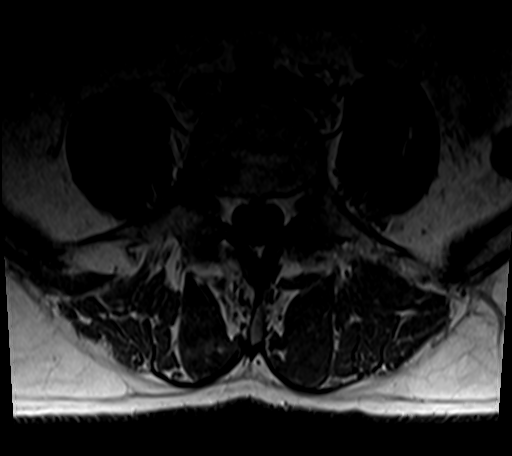
[im 19/38]
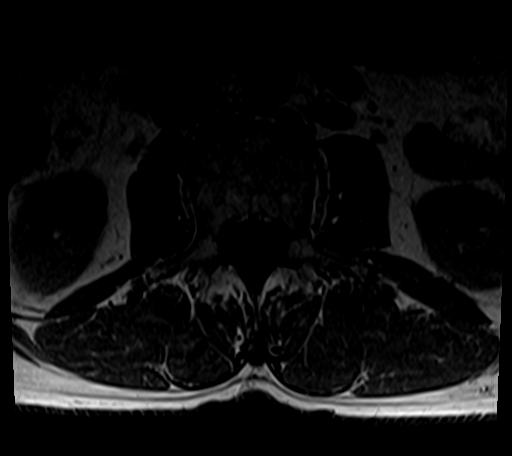
[im 32/38]
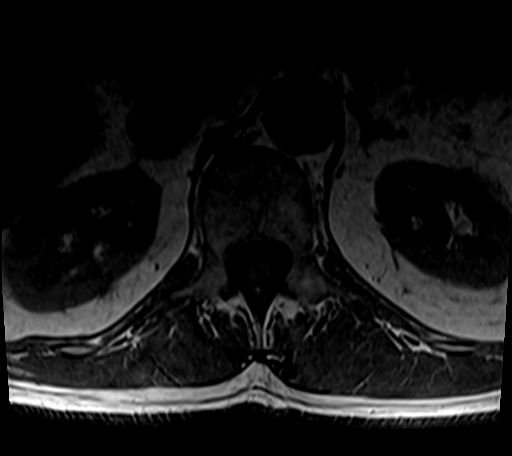

[25 of 48 positions shown; findings below may reference images not displayed]

FINDINGS: Segmentation: Normal on the comparison. S1 spina bifida occulta and
vestigial S1-S2 disc (normal variant).

Alignment: Mild straightening of lumbar lordosis. No
spondylolisthesis.

Vertebrae: Faint degenerative marrow edema at the inferior L1
endplate eccentric to the left (series 4, image 12). Similar subtle
marrow edema right superior S1 endplate (image 7). Normal background
bone marrow signal and no other marrow edema or acute osseous
abnormality. Intact visible sacrum and SI joints.

Conus medullaris and cauda equina: Conus extends to the L1-L2 level.
No lower spinal cord or conus signal abnormality. Cauda equina
appears normal.

Paraspinal and other soft tissues: Negative.

Disc levels:

Visible lower thoracic levels through T12-L1 appear normal for age.

L1-L2:  Minor disc desiccation, minimal disc bulge.  No stenosis.

L2-L3: Mild disc bulging, most pronounced at the left foramen. Mild
left L2 foraminal stenosis.

L3-L4: Mild mostly far lateral disc bulging. Borderline to mild
bilateral L3 foraminal stenosis.

L4-L5: Mild disc desiccation and disc space loss. Mild
circumferential disc bulge. Mild asymmetric facet hypertrophy on the
left. No spinal or lateral recess stenosis. Mild bilateral L4
foraminal stenosis.

L5-S1: Mild disc desiccation and circumferential disc bulge. Mild to
moderate facet hypertrophy greater on the right with trace facet
joint fluid. No significant stenosis.
IMPRESSION: 1. Mild for age lumbar spine degeneration. No lumbar spinal or
lateral recess stenosis.
2. Up to mild neural foraminal stenosis primarily due to disc
bulging at the left L2, bilateral L3, and bilateral L4 nerve levels.
3. Faint degenerative endplate marrow edema at L1 on the left, S1 on
the right.

## 2020-03-17 DIAGNOSIS — M1812 Unilateral primary osteoarthritis of first carpometacarpal joint, left hand: Secondary | ICD-10-CM | POA: Diagnosis not present

## 2020-03-17 DIAGNOSIS — M79642 Pain in left hand: Secondary | ICD-10-CM | POA: Diagnosis not present

## 2020-03-17 DIAGNOSIS — M65312 Trigger thumb, left thumb: Secondary | ICD-10-CM | POA: Diagnosis not present

## 2020-03-17 DIAGNOSIS — M65332 Trigger finger, left middle finger: Secondary | ICD-10-CM | POA: Diagnosis not present

## 2020-03-17 DIAGNOSIS — M65351 Trigger finger, right little finger: Secondary | ICD-10-CM | POA: Diagnosis not present

## 2020-03-17 DIAGNOSIS — M65331 Trigger finger, right middle finger: Secondary | ICD-10-CM | POA: Diagnosis not present

## 2020-03-23 DIAGNOSIS — M542 Cervicalgia: Secondary | ICD-10-CM | POA: Diagnosis not present

## 2020-03-30 DIAGNOSIS — M542 Cervicalgia: Secondary | ICD-10-CM | POA: Diagnosis not present

## 2020-04-01 DIAGNOSIS — M65331 Trigger finger, right middle finger: Secondary | ICD-10-CM | POA: Diagnosis not present

## 2020-04-01 DIAGNOSIS — M65351 Trigger finger, right little finger: Secondary | ICD-10-CM | POA: Diagnosis not present

## 2020-04-03 ENCOUNTER — Other Ambulatory Visit: Payer: Self-pay

## 2020-04-03 MED ORDER — PANTOPRAZOLE SODIUM 40 MG PO TBEC: 40.0000 mg | DELAYED_RELEASE_TABLET | Freq: Every day | ORAL | 2 refills | Status: DC

## 2020-04-06 ENCOUNTER — Other Ambulatory Visit: Payer: Self-pay | Admitting: Family Medicine

## 2020-04-16 ENCOUNTER — Other Ambulatory Visit: Payer: Self-pay | Admitting: Family Medicine

## 2020-04-19 DIAGNOSIS — J0140 Acute pansinusitis, unspecified: Secondary | ICD-10-CM | POA: Diagnosis not present

## 2020-04-19 DIAGNOSIS — Z20822 Contact with and (suspected) exposure to covid-19: Secondary | ICD-10-CM | POA: Diagnosis not present

## 2020-04-19 DIAGNOSIS — Z03818 Encounter for observation for suspected exposure to other biological agents ruled out: Secondary | ICD-10-CM | POA: Diagnosis not present

## 2020-04-19 DIAGNOSIS — R5383 Other fatigue: Secondary | ICD-10-CM | POA: Diagnosis not present

## 2020-04-25 ENCOUNTER — Other Ambulatory Visit: Payer: Self-pay | Admitting: Family Medicine

## 2020-05-04 DIAGNOSIS — Z85820 Personal history of malignant melanoma of skin: Secondary | ICD-10-CM | POA: Diagnosis not present

## 2020-05-04 DIAGNOSIS — Z86018 Personal history of other benign neoplasm: Secondary | ICD-10-CM | POA: Diagnosis not present

## 2020-05-04 DIAGNOSIS — L814 Other melanin hyperpigmentation: Secondary | ICD-10-CM | POA: Diagnosis not present

## 2020-05-04 DIAGNOSIS — D225 Melanocytic nevi of trunk: Secondary | ICD-10-CM | POA: Diagnosis not present

## 2020-05-04 DIAGNOSIS — L578 Other skin changes due to chronic exposure to nonionizing radiation: Secondary | ICD-10-CM | POA: Diagnosis not present

## 2020-05-04 DIAGNOSIS — L821 Other seborrheic keratosis: Secondary | ICD-10-CM | POA: Diagnosis not present

## 2020-05-04 DIAGNOSIS — Z808 Family history of malignant neoplasm of other organs or systems: Secondary | ICD-10-CM | POA: Diagnosis not present

## 2020-05-18 ENCOUNTER — Other Ambulatory Visit: Payer: Self-pay

## 2020-05-18 ENCOUNTER — Encounter: Payer: Self-pay | Admitting: Family Medicine

## 2020-05-18 ENCOUNTER — Ambulatory Visit (INDEPENDENT_AMBULATORY_CARE_PROVIDER_SITE_OTHER): Payer: Medicare HMO | Admitting: Family Medicine

## 2020-05-18 VITALS — BP 130/70 | HR 60 | Temp 97.8°F | Wt 153.8 lb

## 2020-05-18 DIAGNOSIS — M25512 Pain in left shoulder: Secondary | ICD-10-CM | POA: Diagnosis not present

## 2020-05-18 NOTE — Progress Notes (Signed)
Established Patient Office Visit  Subjective:  Patient ID: Dennis ZEHREN, male    DOB: 02/21/1950  Age: 70 y.o. MRN: [id number]  CC:  Chief Complaint  Patient presents with  . Shoulder Pain    L shoulder, ongoing issue, has had surgery on it in the past, has become painful after a visit to PT for his neck    HPI Dennis Villarreal presents for 6-week history of left shoulder pain.  Pain is mostly anterior.  He gives history has had 2 prior shoulder surgeries.  First 1 was about 30 years ago in [place].  He states had a "spur" was trimmed at that time.  He then had a second surgery about 15 years ago which was arthroscopic and he states they "cleaned up a scar ".  Current pain started 6 weeks ago.  No injury.  Pain worse with abduction and reaching overhead.  Moderate pain.  Interfering some with sleep.  No cervical neck pain.  No obvious weakness or numbness.  Starting to impair activities more.  Past Medical History:  Diagnosis Date  . ALLERGIC RHINITIS 09/04/2009  . Basal cell carcinoma   . Cancer ([place]) 2012   melanoma  . DYSLIPIDEMIA 09/04/2009  . GERD 09/04/2009  . High cholesterol    just above borderline per patient     Past Surgical History:  Procedure Laterality Date  . GALLBLADDER SURGERY  01/2018  . [place], 2009   left shoulder scromoplasty  . TONSILLECTOMY    . TRIGGER FINGER RELEASE Left     Family History  Problem Relation Age of Onset  . Heart disease Mother        CVA and cardiac arrhythmia, ? atrial fib  . Stroke Mother   . Hypertension Mother   . Melanoma Father   . Glaucoma Father   . Glaucoma Brother     Social History   Socioeconomic History  . Marital status: Married    Spouse name: Not on file  . Number of children: 3  . Years of education: College  . Highest education level: Not on file  Occupational History  . Occupation: TEFL teacher      Comment: Estate manager/land agent  Tobacco Use  . Smoking status:  Former Smoker    Packs/day: 1.00    Years: 20.00    Pack years: 20.00    Types: Cigarettes    Quit date: 01/24/1982    Years since quitting: 38.3  . Smokeless tobacco: Never Used  Vaping Use  . Vaping Use: Never used  Substance and Sexual Activity  . Alcohol use: Yes    Alcohol/week: 0.0 standard drinks    Comment: socially  . Drug use: No  . Sexual activity: Not on file  Other Topics Concern  . Not on file  Social History Narrative   Lives at home with wife and youngest daughter.   Has 3 children.    Caffeine: 2 cups/day    Social Determinants of Health   Financial Resource Strain: Low Risk   . Difficulty of Paying Living Expenses: Not hard at all  Food Insecurity: No Food Insecurity  . Worried About Charity fundraiser in the Last Year: Never true  . Ran Out of Food in the Last Year: Never true  Transportation Needs: No Transportation Needs  . Lack of Transportation (Medical): No  . Lack of Transportation (Non-Medical): No  Physical Activity: Inactive  . Days of Exercise per Week: 0  days  . Minutes of Exercise per Session: 0 min  Stress: No Stress Concern Present  . Feeling of Stress : Not at all  Social Connections: Moderately Integrated  . Frequency of Communication with Friends and Family: More than three times a week  . Frequency of Social Gatherings with Friends and Family: More than three times a week  . Attends Religious Services: Never  . Active Member of Clubs or Organizations: Yes  . Attends Archivist Meetings: More than 4 times per year  . Marital Status: Married  Human resources officer Violence: Not At Risk  . Fear of Current or Ex-Partner: No  . Emotionally Abused: No  . Physically Abused: No  . Sexually Abused: No    Outpatient Medications Prior to Visit  Medication Sig Dispense Refill  . atorvastatin (LIPITOR) 20 MG tablet TAKE ONE TABLET BY MOUTH DAILY 90 tablet 3  . ibuprofen (ADVIL) 800 MG tablet Take 1 tablet (800 mg total) by mouth every  8 (eight) hours as needed. with food.  Do not recommend prolonged use given age and high risk for GI problems such as ulcer 30 tablet 0  . multivitamin (ONE-A-DAY MEN'S) TABS Take 1 tablet by mouth daily.    . pantoprazole (PROTONIX) 40 MG tablet Take 1 tablet (40 mg total) by mouth daily. 90 tablet 2  . psyllium (METAMUCIL) 58.6 % powder Take 1 packet by mouth daily.    . tadalafil (CIALIS) 5 MG tablet TAKE ONE TABLET BY MOUTH DAILY 30 tablet 1  . tamsulosin (FLOMAX) 0.4 MG CAPS capsule TAKE ONE CAPSULE BY MOUTH DAILY 90 capsule 1   No facility-administered medications prior to visit.    Allergies  Allergen Reactions  . Levofloxacin     REACTION: hives and GI upset  . Other Hives    levaquin    ROS Review of Systems  Constitutional: Negative for chills and fever.  Musculoskeletal: Negative for neck pain.  Neurological: Negative for weakness and numbness.      Objective:    Physical Exam Vitals reviewed.  Constitutional:      Appearance: Normal appearance.  Cardiovascular:     Rate and Rhythm: Normal rate and regular rhythm.  Musculoskeletal:     Comments: Scar left anterior shoulder from prior surgery.  No obvious swelling.  No erythema or warmth.  He has fairly good range of motion but does have pain with abduction greater than 45 degrees.  No acromioclavicular tenderness.  No proximal biceps tenderness. No pain with internal rotation or external rotation.  Does have positive empty can test.  Neurological:     Mental Status: He is alert.     BP 130/70 (BP Location: Left Arm, Patient Position: Sitting, Cuff Size: Normal)   Pulse 60   Temp 97.8 F (36.6 C) (Oral)   Wt 153 lb 12.8 oz (69.8 kg)   SpO2 95%   BMI 24.82 kg/m  Wt Readings from Last 3 Encounters:  05/18/20 153 lb 12.8 oz (69.8 kg)  01/10/20 158 lb 6.4 oz (71.8 kg)  12/23/19 156 lb 1.6 oz (70.8 kg)     Health Maintenance Due  Topic Date Due  . PNA vac Low Risk Adult (2 of 2 - PPSV23) 01/16/2018  .  COVID-19 Vaccine (4 - Booster for Pfizer series) 04/28/2020    There are no preventive care reminders to display for this patient.  Lab Results  Component Value Date   TSH 1.25 01/16/2017   Lab Results  Component Value Date  WBC 5.0 01/16/2017   HGB 14.8 01/16/2017   HCT 43.2 01/16/2017   MCV 93.7 01/16/2017   PLT 169.0 01/16/2017   Lab Results  Component Value Date   NA 138 01/02/2018   K 3.9 01/02/2018   CO2 31 01/02/2018   GLUCOSE 87 01/02/2018   BUN 12 01/02/2018   CREATININE 0.83 01/02/2018   BILITOT 1.0 07/13/2018   ALKPHOS 53 07/13/2018   AST 14 07/13/2018   ALT 12 07/13/2018   PROT 6.3 07/13/2018   ALBUMIN 4.3 07/13/2018   CALCIUM 9.0 01/02/2018   GFR 98.14 01/02/2018   Lab Results  Component Value Date   CHOL 133 07/13/2018   Lab Results  Component Value Date   HDL 40.20 07/13/2018   Lab Results  Component Value Date   LDLCALC 77 07/13/2018   Lab Results  Component Value Date   TRIG 78.0 07/13/2018   Lab Results  Component Value Date   CHOLHDL 3 07/13/2018   Lab Results  Component Value Date   HGBA1C 5.5 03/18/2014      Assessment & Plan:   Problem List Items Addressed This Visit   None   Visit Diagnoses    Left shoulder pain, unspecified chronicity    -  Primary   Relevant Orders   Ambulatory referral to Sports Medicine    Patient relates to prior shoulder surgeries with most recent about 15 years ago.  Presents now with impingement type symptoms which have been progressive over a couple months.  We discussed options.  He agreed with sports medicine referral to further clarify especially with his history of prior surgeries to see if there may be available conservative management  No orders of the defined types were placed in this encounter.   Follow-up: No follow-ups on file.    Carolann Littler, MD

## 2020-05-19 NOTE — Progress Notes (Signed)
Subjective:    CC: L shoulder pain  I, Dennis Villarreal, LAT, ATC, am serving as scribe for Dennis Villarreal.  HPI: Pt is a 71 y/o male presenting w/ c/o L shoulder pain x 6 weeks.  He locates his pain to his L ant/sup and post shoulder.  Pt has a hx of 2 prior shoulder surgeries, approximately 30 and 15 years ago.  Radiating pain: intermittently yes L shoulder mechanical symptoms: No Aggravating factors: L shoulder aBd and flexion end-range AROM; L shoulder ext AROM; worse at night Treatments tried: Tylenol; IBU  Pertinent review of Systems: No fevers or chills  Relevant historical information: Prior shoulder surgeries.   Objective:    Vitals:   05/20/20 0752  BP: 124/80  Pulse: 60  SpO2: 97%   General: Well Developed, well nourished, and in no acute distress.   MSK: Left shoulder mature scar superior shoulder otherwise normal. Normal motion pain with abduction and internal rotation. Strength 4/5 abduction and external rotation 5/5 internal rotation. Positive Hawkins and Neer's test. Positive empty can test. Negative Yergason's and speeds test. Pulses cap refill and sensation are intact distally.  Lab and Radiology Results  X-ray images left shoulder obtained today personally and independently interpreted High riding humeral head.  No acute fractures visible. Await formal radiology review  Procedure: Real-time Ultrasound Guided Injection of left shoulder subacromial bursa Device: Philips Affiniti 50G Images permanently stored and available for review in PACS Ultrasound evaluation reveals intact biceps tendon with a small amount of hypoechoic fluid and tendon sheath. Subscapularis tendon is intact. Supraspinatus tendon is intact. Mild subacromial bursitis is present. Infraspinatus tendon is intact. AC joint is widened Verbal informed consent obtained.  Discussed risks and benefits of procedure. Warned about infection bleeding damage to structures skin  hypopigmentation and fat atrophy among others. Patient expresses understanding and agreement Time-out conducted.   Noted no overlying erythema, induration, or other signs of local infection.   Skin prepped in a sterile fashion.   Local anesthesia: Topical Ethyl chloride.   With sterile technique and under real time ultrasound guidance:  40 mg of Kenalog and 2 L of Marcaine injected into tendon sheath. Fluid seen entering the bursa.   Completed without difficulty   Pain immediately resolved suggesting accurate placement of the medication.   Advised to call if fevers/chills, erythema, induration, drainage, or persistent bleeding.   Images permanently stored and available for review in the ultrasound unit.  Impression: Technically successful ultrasound guided injection.     Impression and Recommendations:    Assessment and Plan: 70 y.o. male with persistent left shoulder pain.  Patient has had 2 prior surgeries.  His current pain is consistent with rotator cuff tendinopathy and subacromial impingement and bursitis.  Plan for PT and injection today.  If not improving proceed to MRI to further characterize cause of pain..  Recheck 8 weeks.  PDMP not reviewed this encounter. Orders Placed This Encounter  Procedures  . [place] LIMITED JOINT SPACE STRUCTURES UP LEFT(NO LINKED CHARGES)    Order Specific Question:   Reason for Exam (SYMPTOM  OR DIAGNOSIS REQUIRED)    Answer:   L shoulder pain    Order Specific Question:   Preferred imaging location?    Answer:   [facility]  . DG Shoulder Left    Standing Status:   Future    Standing Expiration Date:   06/19/2020    Order Specific Question:   Reason for Exam (SYMPTOM  OR DIAGNOSIS  REQUIRED)    Answer:   L shoulder pain    Order Specific Question:   Preferred imaging location?    Answer:   [facility]  . Ambulatory referral to Physical Therapy    Referral Priority:   Routine    Referral Type:   Physical Medicine     Referral Reason:   Specialty Services Required    Requested Specialty:   Physical Therapy   No orders of the defined types were placed in this encounter.   Discussed warning signs or symptoms. Please see discharge instructions. Patient expresses understanding.   The above documentation has been reviewed and is accurate and complete Lynne Villarreal, M.D.

## 2020-05-20 ENCOUNTER — Ambulatory Visit: Payer: Self-pay

## 2020-05-20 ENCOUNTER — Telehealth: Payer: Self-pay | Admitting: Family Medicine

## 2020-05-20 ENCOUNTER — Encounter: Payer: Self-pay | Admitting: Family Medicine

## 2020-05-20 ENCOUNTER — Other Ambulatory Visit: Payer: Self-pay

## 2020-05-20 ENCOUNTER — Ambulatory Visit: Payer: Medicare HMO | Admitting: Family Medicine

## 2020-05-20 ENCOUNTER — Ambulatory Visit (INDEPENDENT_AMBULATORY_CARE_PROVIDER_SITE_OTHER): Payer: Medicare HMO

## 2020-05-20 VITALS — BP 124/80 | HR 60 | Ht 66.0 in | Wt 156.2 lb

## 2020-05-20 DIAGNOSIS — G8929 Other chronic pain: Secondary | ICD-10-CM | POA: Diagnosis not present

## 2020-05-20 DIAGNOSIS — M25512 Pain in left shoulder: Secondary | ICD-10-CM | POA: Diagnosis not present

## 2020-05-20 DIAGNOSIS — M19012 Primary osteoarthritis, left shoulder: Secondary | ICD-10-CM | POA: Diagnosis not present

## 2020-05-20 IMAGING — DX DG SHOULDER 2+V*L*
3 series · 3 of 3 positions shown · non-contrast
Comparison: No recent prior.

CLINICAL DATA: Left shoulder pain. Prior history of shoulder
surgery.

EXAM:
LEFT SHOULDER - 2+ VIEW

[shoulder ap (1 of 2)]
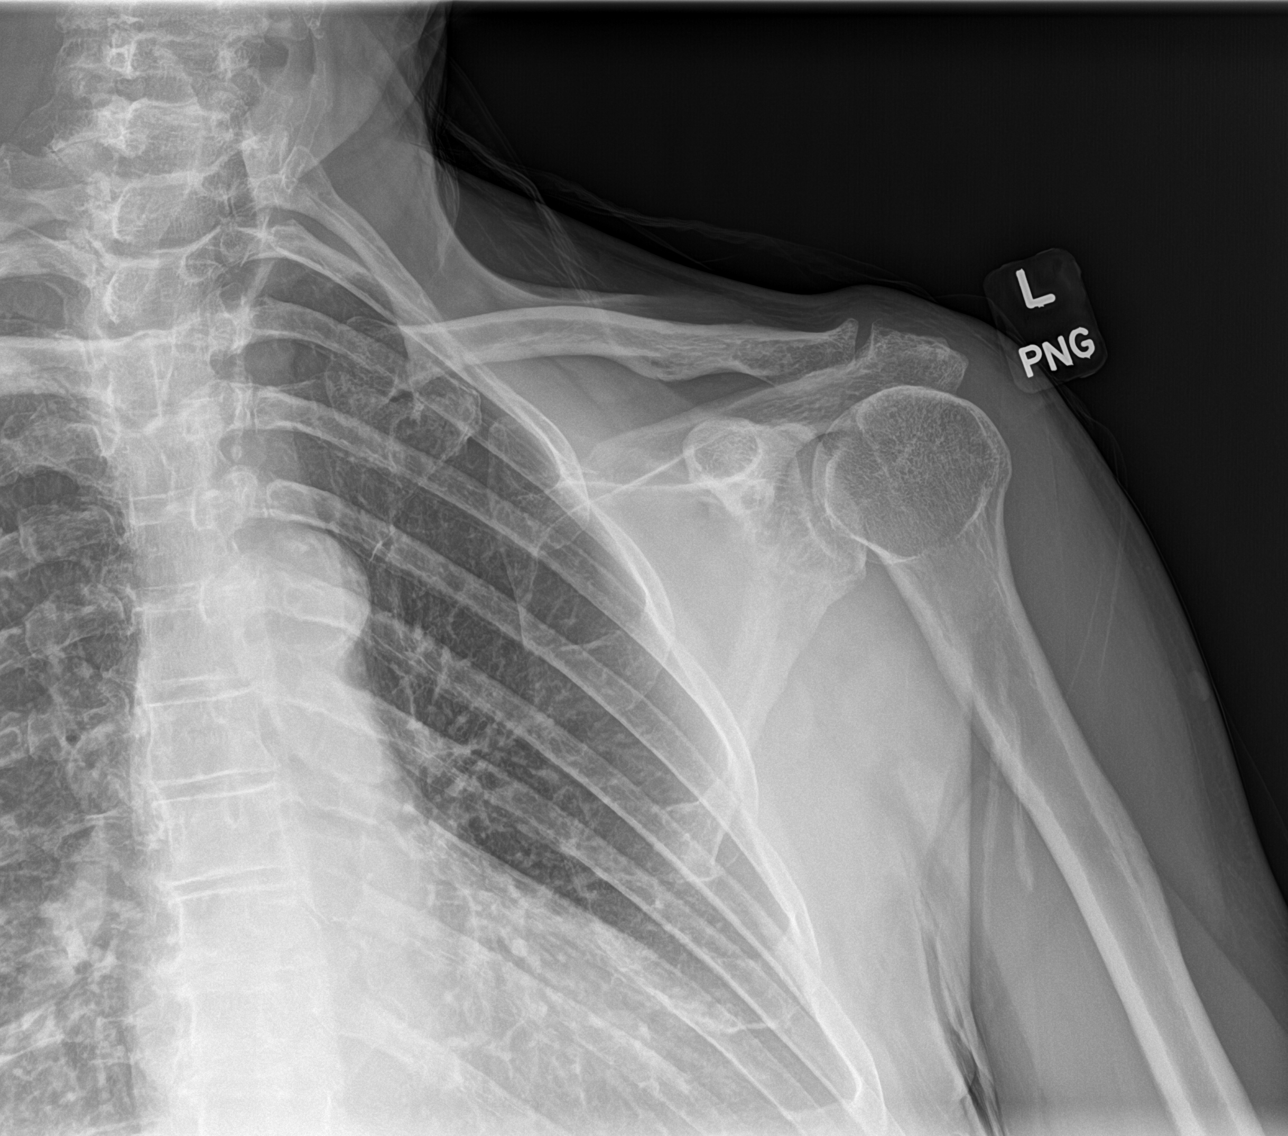

[shoulder ap (2 of 2)]
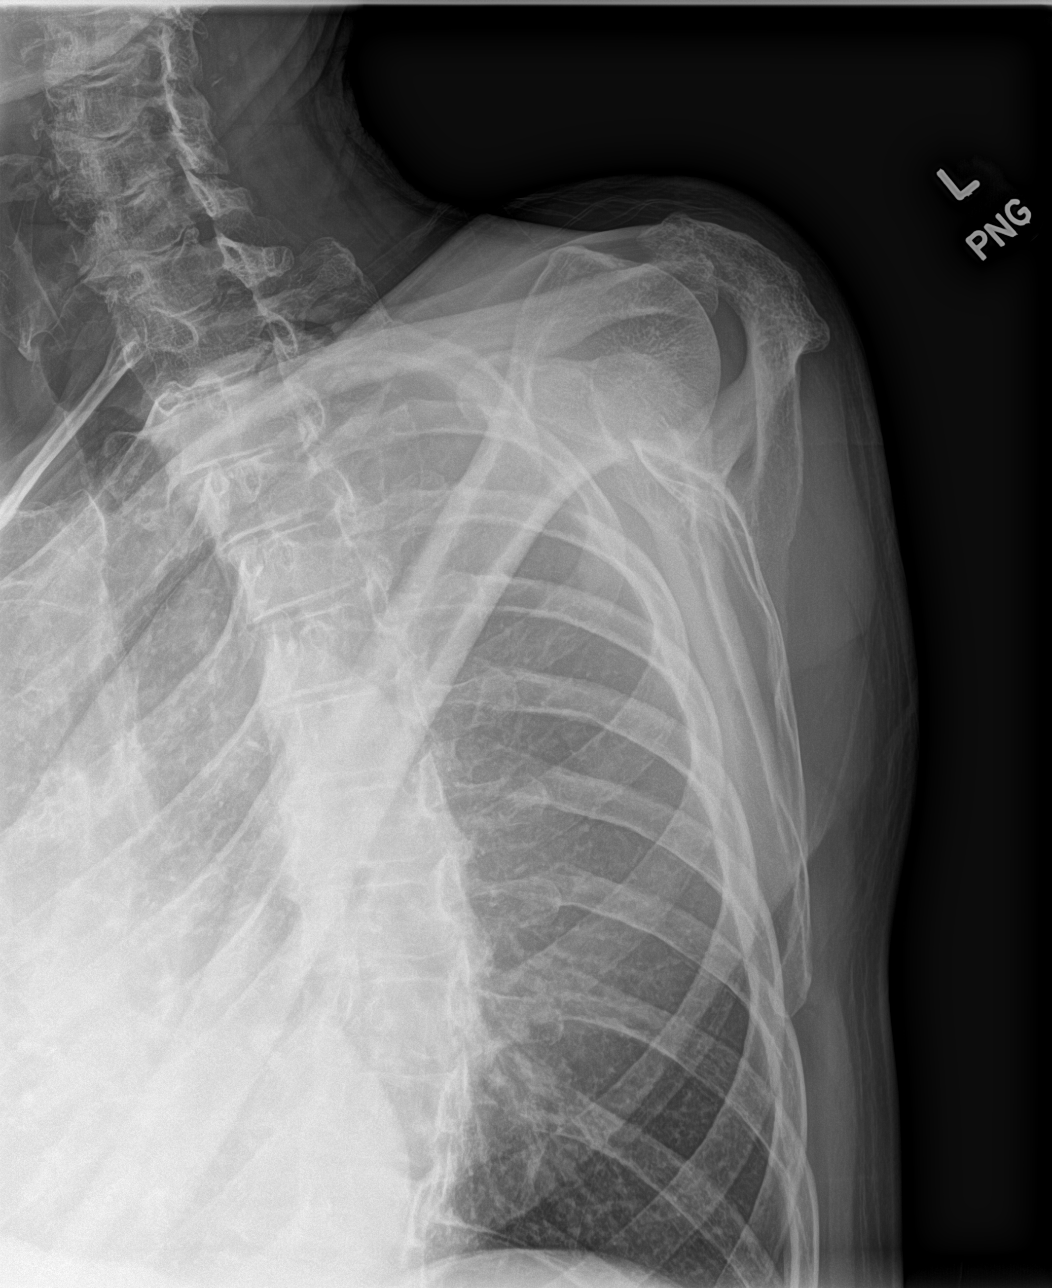

[shoulder axial]
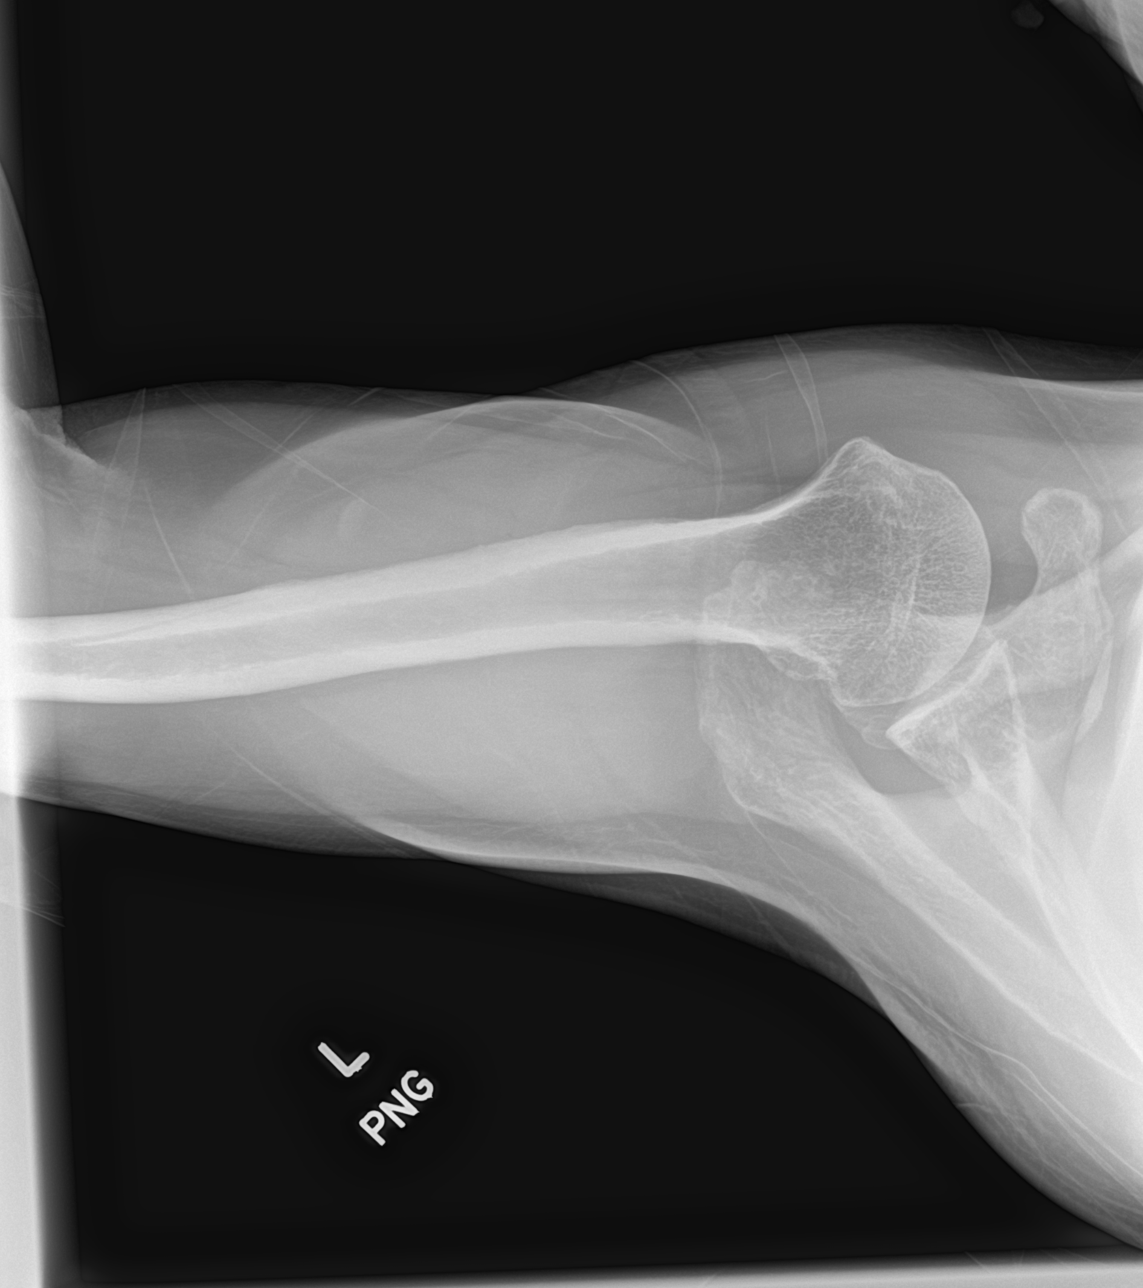

[3 of 3 positions shown; findings below may reference images not displayed]

FINDINGS: Acromioclavicular glenohumeral degenerative change. Shoulder appears
high-riding. Rotator cuff tear cannot be excluded. No evidence of
fracture, dislocation, or separation.
IMPRESSION: 1. Acromioclavicular glenohumeral degenerative change. Shoulder
appears high-riding. Rotator cuff tear cannot be excluded.

2.  No acute abnormality identified.

## 2020-05-20 MED ORDER — HYDROCODONE-ACETAMINOPHEN 5-325 MG PO TABS: 1.0000 | ORAL_TABLET | Freq: Four times a day (QID) | ORAL | 0 refills | Status: DC | PRN

## 2020-05-20 NOTE — Patient Instructions (Addendum)
Good to meet you.  You had a L shoulder injection today.  Call or go to the ER if you develop a large red swollen joint with extreme pain or oozing puss.   Please get an Xray today before you leave  I've referred you to Physical Therapy.  Let us know if you don't hear from them in one week.  Recheck in 8 weeks   Let me know if you have a problem.

## 2020-05-20 NOTE — Telephone Encounter (Signed)
Hydrocodone sent to the [facility]

## 2020-05-20 NOTE — Telephone Encounter (Signed)
Patient called stating that he was seen today by Dr Georgina Snell. He is still having a lot of pain in his shoulder and asked if Dr Georgina Snell would be able to prescribe something to help with the pain?  Please advise.   Pharmacy: [facility]

## 2020-05-21 NOTE — Progress Notes (Signed)
Main finding in the left shoulder is that the ball sits a little high in the socket which is typical for a chronic rotator cuff tear.  I did not see a large rotator cuff tear on ultrasound however if not better physical therapy MRI may be helpful

## 2020-06-02 ENCOUNTER — Telehealth: Payer: Self-pay | Admitting: Family Medicine

## 2020-06-02 NOTE — Telephone Encounter (Signed)
Pt is starting PT next week and is out of pain meds. Would like a refill and thinks he will need to continue taking through PT.  [employer]

## 2020-06-03 MED ORDER — HYDROCODONE-ACETAMINOPHEN 5-325 MG PO TABS: 1.0000 | ORAL_TABLET | Freq: Four times a day (QID) | ORAL | 0 refills | Status: DC | PRN

## 2020-06-03 NOTE — Telephone Encounter (Signed)
Refilled

## 2020-06-03 NOTE — Telephone Encounter (Signed)
Pt informed via VM that RX refilled.

## 2020-06-08 ENCOUNTER — Encounter (HOSPITAL_BASED_OUTPATIENT_CLINIC_OR_DEPARTMENT_OTHER): Payer: Self-pay | Admitting: Physical Therapy

## 2020-06-08 ENCOUNTER — Other Ambulatory Visit: Payer: Self-pay

## 2020-06-08 ENCOUNTER — Ambulatory Visit (HOSPITAL_BASED_OUTPATIENT_CLINIC_OR_DEPARTMENT_OTHER): Payer: Medicare HMO | Attending: Family Medicine | Admitting: Physical Therapy

## 2020-06-08 DIAGNOSIS — R293 Abnormal posture: Secondary | ICD-10-CM

## 2020-06-08 DIAGNOSIS — G8929 Other chronic pain: Secondary | ICD-10-CM | POA: Diagnosis not present

## 2020-06-08 DIAGNOSIS — M25512 Pain in left shoulder: Secondary | ICD-10-CM | POA: Insufficient documentation

## 2020-06-09 NOTE — Patient Instructions (Signed)
Access Code: [email or web address] URL: [email or web address] Date: 06/09/2020 Prepared by: Carolyne Littles  Exercises Supine Shoulder External Rotation in 45 Degrees Abduction AAROM with Dowel - 1 x daily - 7 x weekly - 3 sets - 10 reps Supine Shoulder Flexion Extension AAROM with Dowel - 1 x daily - 7 x weekly - 3 sets - 10 reps Shoulder External Rotation with Anchored Resistance - 1 x daily - 7 x weekly - 3 sets - 10 reps Standing Shoulder Internal Rotation with Anchored Resistance - 1 x daily - 7 x weekly - 3 sets - 10 reps Shoulder extension with resistance - Neutral - 1 x daily - 7 x weekly - 3 sets - 10 reps Scapular Retraction with Resistance - 1 x daily - 7 x weekly - 3 sets - 10 reps

## 2020-06-09 NOTE — Therapy (Signed)
[facility] [address] [place], [place], [address] Phone: [phone]   Fax:  [phone]  Physical Therapy Evaluation  Patient Details  Name: Dennis Villarreal MRN: [id number] Date of Birth: 10/12/1950 Referring Provider (PT): Dr [name]   Encounter Date: 06/08/2020   PT End of Session - 06/08/20 1354    Visit Number 1    Number of Visits 6    Date for PT Re-Evaluation 07/20/20    Authorization Type [employer] medicare    PT Start Time 1300    PT Stop Time 1343    PT Time Calculation (min) 43 min    Activity Tolerance Patient tolerated treatment well    Behavior During Therapy [facility] for tasks assessed/performed           Past Medical History:  Diagnosis Date  . ALLERGIC RHINITIS 09/04/2009  . Basal cell carcinoma   . Cancer ([place]) 2012   melanoma  . DYSLIPIDEMIA 09/04/2009  . GERD 09/04/2009  . High cholesterol    just above borderline per patient     Past Surgical History:  Procedure Laterality Date  . GALLBLADDER SURGERY  01/2018  . [place], 2009   left shoulder scromoplasty  . TONSILLECTOMY    . TRIGGER FINGER RELEASE Left     There were no vitals filed for this visit.    Subjective Assessment - 06/08/20 1303    Subjective Patient has a long history of left shoulder pain. He has had 2 porcudures 15 and 30 years ago. He had a coritsone shot on 05/20/2020. He has recentyl had therapy on his neck which did pretty well. He is right handed    Pertinent History bilateral RTCtears; left repired 15 and 30 years ago; bilateral trigger finger release;    Limitations Lifting;Standing    How long can you sit comfortably? n/a    How long can you stand comfortably? n/a    How long can you walk comfortably? n/a    Diagnostic tests x-ray: degenerative changes; high riding humeral head    Patient Stated Goals to have less pain    Currently in Pain? Yes    Pain Score 2     Pain Location Shoulder    Pain  Orientation Left    Pain Descriptors / Indicators Aching    Pain Type Chronic pain    Pain Onset More than a month ago    Pain Frequency Constant    Aggravating Factors  reaching behind his back and lifting with abduction    Pain Relieving Factors rest; cortisone shot helped    Effect of Pain on Daily Activities difficulty perfroming daily activity    Multiple Pain Sites No              [facility] PT Assessment - 06/09/20 0001      Assessment   Medical Diagnosis Left Shoulder    Referring Provider (PT) Dr [name]    Onset Date/Surgical Date --   6 months   Hand Dominance Right    Next MD Visit 1 month    Prior Therapy for his neck      Precautions   Precautions None      Restrictions   Weight Bearing Restrictions No      Balance Screen   Has the patient fallen in the past 6 months No    Has the patient had a decrease in activity level because of a fear of falling?  No  Is the patient reluctant to leave their home because of a fear of falling?  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    Leisure likes working out      Charity fundraiser Status Within [employer] for tasks assessed    Attention Focused    Focused Attention Appears intact    Memory Appears intact    Awareness Appears intact    Problem Solving Appears intact    Executive Function Reasoning      Sensation   Light Touch Appears Intact      Coordination   Gross Motor Movements are Fluid and Coordinated Yes    Fine Motor Movements are Fluid and Coordinated Yes      AROM   Overall AROM Comments painflul on the left with end range flexion; pain with active IR behind the back      PROM   Overall PROM Comments painful with end range ER and flexion but full range      Strength   Strength Assessment  Site Shoulder    Right/Left Shoulder Right;Left    Right Shoulder Flexion 5/5    Right Shoulder Internal Rotation 5/5    Right Shoulder External Rotation 5/5    Left Shoulder Flexion 4+/5    Left Shoulder Internal Rotation 4+/5    Left Shoulder External Rotation 4+/5      Palpation   Palpation comment tenderness to palpation in the anterior shoulder around the biceps tnedon      Special Tests   Other special tests hawkins (-); empty can (+) all on left                      Objective measurements completed on examination: See above findings.       [facility] [place] Adult PT Treatment/Exercise - 06/09/20 0001      Shoulder Exercises: Supine   Other Supine Exercises wand flexion 2x10; wand er 2x10      Shoulder Exercises: Standing   External Rotation Limitations 2x10 red    Internal Rotation Limitations 2x10 red    Extension Limitations 2x10 red    Row Limitations 2x10 red                  PT Education - 06/09/20 1308    Education Details benefits of postural correction and ther-ex; symptom mangement    Person(s) Educated Patient    Methods Explanation;Demonstration;Verbal cues;Tactile cues    Comprehension Verbalized understanding;Verbal cues required;Returned demonstration;Tactile cues required            PT Short Term Goals - 06/09/20 1309      PT SHORT TERM GOAL #1   Title Patient will demonstrate 5/5 gross left UE strength    Time 6    Period Weeks    Status New    Target Date 07/21/20      PT SHORT TERM GOAL #2   Title Patient will demonstrate full pain free left UE flexion and ER    Time 6    Period Weeks    Status New    Target Date 07/21/20      PT SHORT TERM GOAL #3   Title Patient will be independent with basic strengthening and postural correction  program    Time 4    Period Weeks    Status New    Target Date 07/21/20             PT Long Term Goals - 06/09/20 1313      PT LONG TERM GOAL #1   Title Patient will use his left  UE at work without self report of pain    Time 6    Period Weeks    Status New    Target Date 07/21/20      PT LONG TERM GOAL #2   Title Patient will reach behind his head and back without pain in order to perfrom ADL's    Time 6    Period Weeks    Status New    Target Date 07/21/20                  Plan - 06/08/20 1354    Clinical Impression Statement Patient is a 70 year old male with a 6 monthexacerbation of left shoulder pain. He has had pain for years. He is very active with his hands at work. He had a cortisone shout on 05/20/2020 which helped significantly. He currently has pain with active IR and end range flexion and ER. His has minor strength deficits on the left compared to the right. He would benefit from skilled therapy to improve functional use of his shoulder and to work on posterior chain strengthening to improve posture and impingement of the shoulder. He was given a base posural correection and stability program. We will progress it over the next few visits.    Personal Factors and Comorbidities Comorbidity 1;Comorbidity 2;Profession    Comorbidities multiple RTC surgeries; recent neck pain    Examination-Activity Limitations Lift;Reach Overhead    Examination-Participation Restrictions Occupation;Community Activity    Stability/Clinical Decision Making Stable/Uncomplicated    Clinical Decision Making Low    Rehab Potential Excellent    PT Frequency 1x / week    PT Duration 6 weeks    PT Treatment/Interventions ADLs/Self Care Home Management;Cryotherapy;Electrical Stimulation;Therapeutic exercise;Therapeutic activities;Manual techniques;Passive range of motion;Dry needling;Taping    PT Next Visit Plan continue with strethening porgram; assiess tolerance to last visit; consider closed chain wall exercsies; manual therapy as tolerated. Had manual therapy at last facility and felt it made it slightly worse.    PT Home Exercise Plan Access Code: [email or web address]  URL:  [email or web address]  Date: 06/08/2020  Prepared by: Carolyne Littles    Exercises  Supine Shoulder External Rotation in 45 Degrees Abduction AAROM with Dowel - 1 x daily - 7 x weekly - 3 sets - 10 reps  Supine Shoulder Flexion Extension AAROM with Dowel - 1 x daily - 7 x weekly - 3 sets - 10 reps  Shoulder External Rotation with Anchored Resistance - 1 x daily - 7 x weekly - 3 sets - 10 reps  Standing Shoulder Internal Rotation with Anchored Resistance - 1 x daily - 7 x weekly - 3 sets - 10 reps  Shoulder extension with resistance - Neutral - 1 x daily - 7 x weekly - 3 sets - 10 reps  Scapular Retraction with Resistance - 1 x daily - 7 x weekly - 3 sets - 10 reps    Consulted and Agree with Plan of Care Patient           Patient will benefit from skilled therapeutic intervention in order to improve the following deficits and impairments:  Decreased range of motion,Increased  fascial restricitons,Pain,Decreased strength,Decreased activity tolerance,Impaired UE functional use  Visit Diagnosis: Chronic left shoulder pain  Abnormal posture     Problem List Patient Active Problem List   Diagnosis Date Noted  . BPH (benign prostatic hyperplasia) 05/24/2017  . Diplopia 03/18/2014  . Dizziness and giddiness 03/18/2014  . Vision loss 03/18/2014  . Sinusitis 02/03/2013  . Anxiety and depression 09/13/2012  . History of melanoma 06/30/2011  . Hyperlipidemia 09/04/2009  . ALLERGIC RHINITIS 09/04/2009  . GERD 09/04/2009    Carney Living PT DPT  06/09/2020, 1:21 PM  [facility] [address] [place], [place], [address] Phone: [phone]   Fax:  [phone]  Name: Dennis Villarreal MRN: [id number] Date of Birth: 07-18-1950

## 2020-06-09 NOTE — Addendum Note (Signed)
Addended by: Carney Living on: 06/09/2020 01:26 PM   Modules accepted: Orders

## 2020-06-16 ENCOUNTER — Encounter (HOSPITAL_BASED_OUTPATIENT_CLINIC_OR_DEPARTMENT_OTHER): Payer: Self-pay | Admitting: Physical Therapy

## 2020-06-16 ENCOUNTER — Ambulatory Visit (HOSPITAL_BASED_OUTPATIENT_CLINIC_OR_DEPARTMENT_OTHER): Payer: Medicare HMO | Admitting: Physical Therapy

## 2020-06-16 ENCOUNTER — Other Ambulatory Visit: Payer: Self-pay

## 2020-06-16 DIAGNOSIS — M25512 Pain in left shoulder: Secondary | ICD-10-CM

## 2020-06-16 DIAGNOSIS — G8929 Other chronic pain: Secondary | ICD-10-CM | POA: Diagnosis not present

## 2020-06-16 DIAGNOSIS — R293 Abnormal posture: Secondary | ICD-10-CM | POA: Diagnosis not present

## 2020-06-17 ENCOUNTER — Encounter (HOSPITAL_BASED_OUTPATIENT_CLINIC_OR_DEPARTMENT_OTHER): Payer: Self-pay | Admitting: Physical Therapy

## 2020-06-17 NOTE — Therapy (Signed)
[facility] [address] [place], [place], [address] Phone: [phone]   Fax:  [phone]  Physical Therapy Treatment  Patient Details  Name: TALOR CHEEMA MRN: [id number] Date of Birth: Dec 19, 1950 Referring Provider (PT): Dr [name]   Encounter Date: 06/16/2020   PT End of Session - 06/17/20 1155    Visit Number 2    Number of Visits 6    Date for PT Re-Evaluation 07/20/20    Authorization Type A[employer]na medicare    PT Start Time 1430    PT Stop Time 1510    PT Time Calculation (min) 40 min    Activity Tolerance Patient tolerated treatment well    Behavior During Therapy [facility] for tasks assessed/performed           Past Medical History:  Diagnosis Date  . ALLERGIC RHINITIS 09/04/2009  . Basal cell carcinoma   . Cancer ([place]) 2012   melanoma  . DYSLIPIDEMIA 09/04/2009  . GERD 09/04/2009  . High cholesterol    just above borderline per patient     Past Surgical History:  Procedure Laterality Date  . GALLBLADDER SURGERY  01/2018  . [place], 2009   left shoulder scromoplasty  . TONSILLECTOMY    . TRIGGER FINGER RELEASE Left     There were no vitals filed for this visit.   Subjective Assessment - 06/17/20 1147    Subjective Patient reports his shoulder has been much better. His pain is positional but much less. He is still having pain when he reaches out to the side.    Pertinent History bilateral RTCtears; left repired 15 and 30 years ago; bilateral trigger finger release;    Limitations Lifting;Standing    How long can you sit comfortably? n/a    How long can you stand comfortably? n/a    Diagnostic tests x-ray: degenerative changes; high riding humeral head    Patient Stated Goals to have less pain    Currently in Pain? No/denies                             [facility] Adult PT Treatment/Exercise - 06/17/20 0001      Shoulder Exercises: Supine   Other Supine Exercises wand  flexion 2x10; wand er 2x10      Shoulder Exercises: Sidelying   Other Sidelying Exercises 1lb x15 o weight x15      Shoulder Exercises: Standing   External Rotation Limitations 2x10 red    Internal Rotation Limitations 2x10 green    Extension Limitations 2x10 green    Row Limitations 2x10 green      Manual Therapy   Manual Therapy Soft tissue mobilization;Joint mobilization    Joint Mobilization trigger point release to the right upper trap    Soft tissue mobilization Grade III and IV infoerior and posterior glides                  PT Education - 06/17/20 1155    Education Details updated HEP    Person(s) Educated Patient    Methods Explanation;Demonstration;Tactile cues;Verbal cues    Comprehension Verbalized understanding;Returned demonstration;Verbal cues required;Tactile cues required            PT Short Term Goals - 06/09/20 1309      PT SHORT TERM GOAL #1   Title Patient will demonstrate 5/5 gross left UE strength    Time 6    Period Weeks  Status New    Target Date 07/21/20      PT SHORT TERM GOAL #2   Title Patient will demonstrate full pain free left UE flexion and ER    Time 6    Period Weeks    Status New    Target Date 07/21/20      PT SHORT TERM GOAL #3   Title Patient will be independent with basic strengthening and postural correction program    Time 4    Period Weeks    Status New    Target Date 07/21/20             PT Long Term Goals - 06/09/20 1313      PT LONG TERM GOAL #1   Title Patient will use his left UE at work without self report of pain    Time 6    Period Weeks    Status New    Target Date 07/21/20      PT LONG TERM GOAL #2   Title Patient will reach behind his head and back without pain in order to perfrom ADL's    Time 6    Period Weeks    Status New    Target Date 07/21/20                 Plan - 06/16/20 1443    Clinical Impression Statement Patient is making good progress. He had a mild  limitation in ER but had full ER after manual. Therapy advanced his exercises. He was given sidelying ER for home and closed chain flexion. He was advised to continue with his HEP at home. He was advised to continue to try to maintain full ER at home with his cane.    Personal Factors and Comorbidities Comorbidity 1;Comorbidity 2;Profession    Comorbidities multiple RTC surgeries; recent neck pain    Examination-Activity Limitations Lift;Reach Overhead    Examination-Participation Restrictions Occupation;Community Activity    Stability/Clinical Decision Making Stable/Uncomplicated    Clinical Decision Making Low    Rehab Potential Excellent    PT Frequency 1x / week    PT Duration 6 weeks    PT Treatment/Interventions ADLs/Self Care Home Management;Cryotherapy;Electrical Stimulation;Therapeutic exercise;Therapeutic activities;Manual techniques;Passive range of motion;Dry needling;Taping;Patient/family education    PT Next Visit Plan continue with strethening porgram; assiess tolerance to last visit; consider closed chain wall exercsies; manual therapy as tolerated. Had manual therapy at last facility and felt it made it slightly worse.    PT Home Exercise Plan Access Code: [email or web address]  URL: [email or web address]  Date: 06/08/2020  Prepared by: Carolyne Littles    Exercises  Supine Shoulder External Rotation in 45 Degrees Abduction AAROM with Dowel - 1 x daily - 7 x weekly - 3 sets - 10 reps  Supine Shoulder Flexion Extension AAROM with Dowel - 1 x daily - 7 x weekly - 3 sets - 10 reps  Shoulder External Rotation with Anchored Resistance - 1 x daily - 7 x weekly - 3 sets - 10 reps  Standing Shoulder Internal Rotation with Anchored Resistance - 1 x daily - 7 x weekly - 3 sets - 10 reps  Shoulder extension with resistance - Neutral - 1 x daily - 7 x weekly - 3 sets - 10 reps  Scapular Retraction with Resistance - 1 x daily - 7 x weekly - 3 sets - 10 reps    Consulted and Agree with Plan of Care  Patient  Patient will benefit from skilled therapeutic intervention in order to improve the following deficits and impairments:  Decreased range of motion,Increased fascial restricitons,Pain,Decreased strength,Decreased activity tolerance,Impaired UE functional use  Visit Diagnosis: Chronic left shoulder pain  Abnormal posture     Problem List Patient Active Problem List   Diagnosis Date Noted  . BPH (benign prostatic hyperplasia) 05/24/2017  . Diplopia 03/18/2014  . Dizziness and giddiness 03/18/2014  . Vision loss 03/18/2014  . Sinusitis 02/03/2013  . Anxiety and depression 09/13/2012  . History of melanoma 06/30/2011  . Hyperlipidemia 09/04/2009  . ALLERGIC RHINITIS 09/04/2009  . GERD 09/04/2009    Carney Living PT DPT  06/17/2020, 1:15 PM  [facility] [address] [place], [place], [address] Phone: [phone]   Fax:  [phone]  Name: REISE GLADNEY MRN: [id number] Date of Birth: 03-Jul-1950

## 2020-06-30 ENCOUNTER — Ambulatory Visit (HOSPITAL_BASED_OUTPATIENT_CLINIC_OR_DEPARTMENT_OTHER): Payer: Medicare HMO | Attending: Family Medicine | Admitting: Physical Therapy

## 2020-06-30 ENCOUNTER — Other Ambulatory Visit: Payer: Self-pay | Admitting: Family Medicine

## 2020-06-30 ENCOUNTER — Encounter (HOSPITAL_BASED_OUTPATIENT_CLINIC_OR_DEPARTMENT_OTHER): Payer: Self-pay | Admitting: Physical Therapy

## 2020-06-30 ENCOUNTER — Other Ambulatory Visit: Payer: Self-pay

## 2020-06-30 DIAGNOSIS — M25512 Pain in left shoulder: Secondary | ICD-10-CM | POA: Insufficient documentation

## 2020-06-30 DIAGNOSIS — G8929 Other chronic pain: Secondary | ICD-10-CM | POA: Diagnosis not present

## 2020-06-30 DIAGNOSIS — R293 Abnormal posture: Secondary | ICD-10-CM | POA: Diagnosis not present

## 2020-07-01 ENCOUNTER — Encounter (HOSPITAL_BASED_OUTPATIENT_CLINIC_OR_DEPARTMENT_OTHER): Payer: Self-pay | Admitting: Physical Therapy

## 2020-07-01 NOTE — Therapy (Addendum)
[facility] [address] [place], [place], [address] Phone: [phone]   Fax:  [phone]  Physical Therapy Treatment/Discharge   Patient Details  Name: Dennis Villarreal MRN: [id number] Date of Birth: September 26, 1950 Referring Provider (PT): Dr [name]   Encounter Date: 06/30/2020   PT End of Session - 06/30/20 1353     Visit Number 3    Number of Visits 6    Date for PT Re-Evaluation 07/20/20    Authorization Type [employer]na medicare    PT Start Time 5686    PT Stop Time 1426    PT Time Calculation (min) 41 min    Activity Tolerance Patient tolerated treatment well    Behavior During Therapy [facility] for tasks assessed/performed             Past Medical History:  Diagnosis Date   ALLERGIC RHINITIS 09/04/2009   Basal cell carcinoma    Cancer ([place]) 2012   melanoma   DYSLIPIDEMIA 09/04/2009   GERD 09/04/2009   High cholesterol    just above borderline per patient     Past Surgical History:  Procedure Laterality Date   GALLBLADDER SURGERY  01/2018   [place], 2009   left shoulder scromoplasty   TONSILLECTOMY     TRIGGER FINGER RELEASE Left     There were no vitals filed for this visit.   Subjective Assessment - 06/30/20 1352     Subjective Patient continues to have pain at night. He has increased pain when he sleeps on it. He is otherwise not having pain.    Pertinent History bilateral RTCtears; left repired 15 and 30 years ago; bilateral trigger finger release;    Limitations Lifting;Standing    How long can you sit comfortably? n/a    How long can you stand comfortably? n/a    How long can you walk comfortably? n/a    Diagnostic tests x-ray: degenerative changes; high riding humeral head    Patient Stated Goals to have less pain    Currently in Pain? No/denies                               [facility] Adult PT Treatment/Exercise - 07/01/20 0001       Shoulder Exercises: Supine    Other Supine Exercises wand flexion 2x10; wand er 2x10      Shoulder Exercises: Sidelying   Other Sidelying Exercises 1lb x15 o weight x15      Shoulder Exercises: Standing   External Rotation Limitations 2x10 red    Internal Rotation Limitations 2x10 green    Extension Limitations 2x10 green    Row Limitations 2x10 green    Other Standing Exercises standing flexion and scaption 2x15      Manual Therapy   Manual Therapy Soft tissue mobilization;Joint mobilization    Joint Mobilization trigger point release to the right upper trap    Soft tissue mobilization Grade III and IV infoerior and posterior glides                    PT Education - 06/30/20 1353     Education Details reviewed HEp and symptom mangement    Person(s) Educated Patient    Methods Explanation;Demonstration;Tactile cues;Verbal cues    Comprehension Verbalized understanding;Returned demonstration;Verbal cues required;Tactile cues required              PT Short Term Goals - 06/09/20 1309  PT SHORT TERM GOAL #1   Title Patient will demonstrate 5/5 gross left UE strength    Time 6    Period Weeks    Status New    Target Date 07/21/20      PT SHORT TERM GOAL #2   Title Patient will demonstrate full pain free left UE flexion and ER    Time 6    Period Weeks    Status New    Target Date 07/21/20      PT SHORT TERM GOAL #3   Title Patient will be independent with basic strengthening and postural correction program    Time 4    Period Weeks    Status New    Target Date 07/21/20               PT Long Term Goals - 06/09/20 1313       PT LONG TERM GOAL #1   Title Patient will use his left UE at work without self report of pain    Time 6    Period Weeks    Status New    Target Date 07/21/20      PT LONG TERM GOAL #2   Title Patient will reach behind his head and back without pain in order to perfrom ADL's    Time 6    Period Weeks    Status New    Target Date 07/21/20                    Plan - 06/30/20 1421     Clinical Impression Statement Patient continues to make good progress. He is only having pain while sleeping at this time.Therapy added in standing flexion exercises today. Overall he did very well. he had very little pain. He flet some popping with closed chain shoulder flexion.    Personal Factors and Comorbidities Comorbidity 1;Comorbidity 2;Profession    Comorbidities multiple RTC surgeries; recent neck pain    Examination-Activity Limitations Lift;Reach Overhead    Examination-Participation Restrictions Occupation;Community Activity    Stability/Clinical Decision Making Stable/Uncomplicated    Clinical Decision Making Low    Rehab Potential Excellent    PT Frequency 1x / week    PT Duration 6 weeks    PT Treatment/Interventions ADLs/Self Care Home Management;Cryotherapy;Electrical Stimulation;Therapeutic exercise;Therapeutic activities;Manual techniques;Passive range of motion;Dry needling;Taping;Patient/family education    PT Next Visit Plan continue with strethening porgram; assiess tolerance to last visit; consider closed chain wall exercsies; manual therapy as tolerated. Had manual therapy at last facility and felt it made it slightly worse.    PT Home Exercise Plan Access Code: [email or web address]  URL: [email or web address]  Date: 06/08/2020  Prepared by: Carolyne Littles    Exercises  Supine Shoulder External Rotation in 45 Degrees Abduction AAROM with Dowel - 1 x daily - 7 x weekly - 3 sets - 10 reps  Supine Shoulder Flexion Extension AAROM with Dowel - 1 x daily - 7 x weekly - 3 sets - 10 reps  Shoulder External Rotation with Anchored Resistance - 1 x daily - 7 x weekly - 3 sets - 10 reps  Standing Shoulder Internal Rotation with Anchored Resistance - 1 x daily - 7 x weekly - 3 sets - 10 reps  Shoulder extension with resistance - Neutral - 1 x daily - 7 x weekly - 3 sets - 10 reps  Scapular Retraction with Resistance - 1 x daily - 7 x  weekly - 3 sets - 10 reps  Consulted and Agree with Plan of Care Patient             Patient will benefit from skilled therapeutic intervention in order to improve the following deficits and impairments:  Decreased range of motion,Increased fascial restricitons,Pain,Decreased strength,Decreased activity tolerance,Impaired UE functional use  Visit Diagnosis: Chronic left shoulder pain  Abnormal posture  PHYSICAL THERAPY DISCHARGE SUMMARY  Visits from Start of Care: 3  Current functional level related to goals / functional outcomes: Improved pain with functional activity   Remaining deficits: Continued to have pain although somewhat improved   Education / Equipment: HEP    Patient agrees to discharge. Patient goals were partially met. Patient is being discharged due to not returning since the last visit.    Problem List Patient Active Problem List   Diagnosis Date Noted   BPH (benign prostatic hyperplasia) 05/24/2017   Diplopia 03/18/2014   Dizziness and giddiness 03/18/2014   Vision loss 03/18/2014   Sinusitis 02/03/2013   Anxiety and depression 09/13/2012   History of melanoma 06/30/2011   Hyperlipidemia 09/04/2009   ALLERGIC RHINITIS 09/04/2009   GERD 09/04/2009    Carney Living PT DPT  07/01/2020, 11:37 AM  [facility] [place], [place], [address] Phone: [phone]   Fax:  [phone]  Name: Dennis Villarreal MRN: [id number] Date of Birth: 1950/08/20

## 2020-07-06 ENCOUNTER — Ambulatory Visit (HOSPITAL_BASED_OUTPATIENT_CLINIC_OR_DEPARTMENT_OTHER): Payer: Medicare HMO | Admitting: Physical Therapy

## 2020-07-06 ENCOUNTER — Other Ambulatory Visit: Payer: Self-pay

## 2020-07-06 ENCOUNTER — Encounter: Payer: Self-pay | Admitting: Family Medicine

## 2020-07-06 ENCOUNTER — Ambulatory Visit (INDEPENDENT_AMBULATORY_CARE_PROVIDER_SITE_OTHER): Payer: Medicare HMO | Admitting: Family Medicine

## 2020-07-06 VITALS — BP 120/60 | HR 67 | Temp 98.0°F | Wt 153.7 lb

## 2020-07-06 DIAGNOSIS — R0789 Other chest pain: Secondary | ICD-10-CM | POA: Diagnosis not present

## 2020-07-06 DIAGNOSIS — R252 Cramp and spasm: Secondary | ICD-10-CM | POA: Diagnosis not present

## 2020-07-06 NOTE — Patient Instructions (Signed)
Increase fluid intake  Consider OTC Magnesium 400 to 500 mg daily  Your EKG looks good.

## 2020-07-06 NOTE — Progress Notes (Signed)
Established Patient Office Visit  Subjective:  Patient ID: Dennis Villarreal, male    DOB: Nov 13, 1950  Age: 70 y.o. MRN: [id number]  CC:  Chief Complaint  Patient presents with   cramps    Cramps in both legs at night, x 1 year, chest pain left side, x 1 year, Left shoulder pain, deep aching pain,     HPI Dennis Villarreal presents for the following issues  Bilateral leg cramps.  These occur in his calves and sometimes in his feet.  These occur mostly at night.  He has had these intermittently for a year.  He realizes he may not be drinking enough fluids.  Has taken magnesium in the past.  He also takes a multivitamin.  Previous magnesium levels and electrolyte levels have been normal.  Does not take any diuretics.  He had some recent left shoulder pains.  We had referred to sports medicine.  He had steroid injection and physical therapy and shoulder pain is much better overall.  However he does have some intermittent deltoid pain which he thinks is different.  He has had some chest pains for the past year -left-sided which are intermittent and occur at rest.  Never exertional.  No associated dyspnea.  Not reproducible.  No active GERD symptoms.  Does not any family history of premature CAD. He is a non-smoker.  No history of diabetes.  Past Medical History:  Diagnosis Date   ALLERGIC RHINITIS 09/04/2009   Basal cell carcinoma    Cancer ([place]) 2012   melanoma   DYSLIPIDEMIA 09/04/2009   GERD 09/04/2009   High cholesterol    just above borderline per patient     Past Surgical History:  Procedure Laterality Date   GALLBLADDER SURGERY  01/2018   SHOULDER SURGERY  1986, 2009   left shoulder scromoplasty   TONSILLECTOMY     TRIGGER FINGER RELEASE Left     Family History  Problem Relation Age of Onset   Heart disease Mother        CVA and cardiac arrhythmia, ? atrial fib   Stroke Mother    Hypertension Mother    Melanoma Father    Glaucoma Father    Glaucoma Brother      Social History   Socioeconomic History   Marital status: Married    Spouse name: Not on file   Number of children: 3   Years of education: College   Highest education level: Not on file  Occupational History   Occupation: TEFL teacher      Comment: Financial planner systems  Tobacco Use   Smoking status: Former    Packs/day: 1.00    Years: 20.00    Pack years: 20.00    Types: Cigarettes    Quit date: 01/24/1982    Years since quitting: 38.4   Smokeless tobacco: Never  Vaping Use   Vaping Use: Never used  Substance and Sexual Activity   Alcohol use: Yes    Alcohol/week: 0.0 standard drinks    Comment: socially   Drug use: No   Sexual activity: Not on file  Other Topics Concern   Not on file  Social History Narrative   Lives at home with wife and youngest daughter.   Has 3 children.    Caffeine: 2 cups/day    Social Determinants of Health   Financial Resource Strain: Low Risk    Difficulty of Paying Living Expenses: Not hard at all  Food Insecurity: No Food Insecurity  Worried About Charity fundraiser in the Last Year: Never true   [place] in the Last Year: Never true  Transportation Needs: No Transportation Needs   Lack of Transportation (Medical): No   Lack of Transportation (Non-Medical): No  Physical Activity: Inactive   Days of Exercise per Week: 0 days   Minutes of Exercise per Session: 0 min  Stress: No Stress Concern Present   Feeling of Stress : Not at all  Social Connections: Moderately Integrated   Frequency of Communication with Friends and Family: More than three times a week   Frequency of Social Gatherings with Friends and Family: More than three times a week   Attends Religious Services: Never   Marine scientist or Organizations: Yes   Attends Music therapist: More than 4 times per year   Marital Status: Married  Human resources officer Violence: Not At Risk   Fear of Current or Ex-Partner: No   Emotionally Abused:  No   Physically Abused: No   Sexually Abused: No    Outpatient Medications Prior to Visit  Medication Sig Dispense Refill   atorvastatin (LIPITOR) 20 MG tablet TAKE ONE TABLET BY MOUTH DAILY 90 tablet 3   HYDROcodone-acetaminophen (NORCO/VICODIN) 5-325 MG tablet Take 1 tablet by mouth every 6 (six) hours as needed. 10 tablet 0   ibuprofen (ADVIL) 800 MG tablet Take 1 tablet (800 mg total) by mouth every 8 (eight) hours as needed. with food.  Do not recommend prolonged use given age and high risk for GI problems such as ulcer 30 tablet 0   multivitamin (ONE-A-DAY MEN'S) TABS Take 1 tablet by mouth daily.     pantoprazole (PROTONIX) 40 MG tablet Take 1 tablet (40 mg total) by mouth daily. 90 tablet 2   psyllium (METAMUCIL) 58.6 % powder Take 1 packet by mouth daily.     tadalafil (CIALIS) 5 MG tablet TAKE ONE TABLET BY MOUTH DAILY 30 tablet 0   tamsulosin (FLOMAX) 0.4 MG CAPS capsule TAKE ONE CAPSULE BY MOUTH DAILY 90 capsule 1   No facility-administered medications prior to visit.    Allergies  Allergen Reactions   Levofloxacin     REACTION: hives and GI upset   Other Hives    levaquin    ROS Review of Systems  Constitutional:  Negative for fatigue and unexpected weight change.  Eyes:  Negative for visual disturbance.  Respiratory:  Negative for cough, chest tightness and shortness of breath.   Cardiovascular:  Positive for chest pain. Negative for palpitations and leg swelling.  Neurological:  Negative for dizziness, syncope, weakness, light-headedness and headaches.     Objective:    Physical Exam Constitutional:      Appearance: He is well-developed.  HENT:     Right Ear: External ear normal.     Left Ear: External ear normal.  Eyes:     Pupils: Pupils are equal, round, and reactive to light.  Neck:     Thyroid: No thyromegaly.  Cardiovascular:     Rate and Rhythm: Normal rate and regular rhythm.  Pulmonary:     Effort: Pulmonary effort is normal. No respiratory  distress.     Breath sounds: Normal breath sounds. No wheezing or rales.  Musculoskeletal:     Cervical back: Neck supple.     Right lower leg: No edema.     Left lower leg: No edema.  Neurological:     Mental Status: He is alert and oriented to person,  place, and time.    BP 120/60 (BP Location: Left Arm, Patient Position: Sitting, Cuff Size: Normal)   Pulse 67   Temp 98 F (36.7 C) (Oral)   Wt 153 lb 11.2 oz (69.7 kg)   SpO2 97%   BMI 24.81 kg/m  Wt Readings from Last 3 Encounters:  07/06/20 153 lb 11.2 oz (69.7 kg)  05/20/20 156 lb 3.2 oz (70.9 kg)  05/18/20 153 lb 12.8 oz (69.8 kg)     Health Maintenance Due  Topic Date Due   PNA vac Low Risk Adult (2 of 2 - PPSV23) 01/16/2018   COVID-19 Vaccine (4 - Booster for Pfizer series) 01/29/2020    There are no preventive care reminders to display for this patient.  Lab Results  Component Value Date   TSH 1.25 01/16/2017   Lab Results  Component Value Date   WBC 5.0 01/16/2017   HGB 14.8 01/16/2017   HCT 43.2 01/16/2017   MCV 93.7 01/16/2017   PLT 169.0 01/16/2017   Lab Results  Component Value Date   NA 138 01/02/2018   K 3.9 01/02/2018   CO2 31 01/02/2018   GLUCOSE 87 01/02/2018   BUN 12 01/02/2018   CREATININE 0.83 01/02/2018   BILITOT 1.0 07/13/2018   ALKPHOS 53 07/13/2018   AST 14 07/13/2018   ALT 12 07/13/2018   PROT 6.3 07/13/2018   ALBUMIN 4.3 07/13/2018   CALCIUM 9.0 01/02/2018   GFR 98.14 01/02/2018   Lab Results  Component Value Date   CHOL 133 07/13/2018   Lab Results  Component Value Date   HDL 40.20 07/13/2018   Lab Results  Component Value Date   LDLCALC 77 07/13/2018   Lab Results  Component Value Date   TRIG 78.0 07/13/2018   Lab Results  Component Value Date   CHOLHDL 3 07/13/2018   Lab Results  Component Value Date   HGBA1C 5.5 03/18/2014      Assessment & Plan:   #1 bilateral leg cramps.  Risk factor is predominantly lack of adequate fluids  -Increase fluid  consumption -Consider over-the-counter magnesium 400 mg daily -Previous electrolytes and magnesium levels have been normal. -Also discussed supplementation with B vitamins such as B6  #2 atypical chest pains.  Never exertional.  Etiology unclear  -Start with EKG= normal.  EKG shows normal sinus rhythm with no acute ST-T changes -Recommend follow-up immediately for any exertional component, dyspnea, or any other new symptoms   No orders of the defined types were placed in this encounter.   Follow-up: No follow-ups on file.    Carolann Littler, MD

## 2020-07-07 ENCOUNTER — Encounter (HOSPITAL_BASED_OUTPATIENT_CLINIC_OR_DEPARTMENT_OTHER): Payer: Medicare HMO | Admitting: Physical Therapy

## 2020-07-13 NOTE — Progress Notes (Signed)
   I, [name], LAT, ATC, am serving as scribe for Dr. [name].  Dennis Villarreal is a 70 y.o. male who presents to [place] at [facility] today for f/u L shoulder pain. Pt has a hx of 2 prior shoulder surgeries, approximately 30 and 15 years ago. Pt was last seen by [name] on 05/20/20 and was given a L subacromial steroid injection and was referred to PT of which he's completed 3 visits.  Since his last visit, pt reports that his L shoulder pain is much better and only has mild intermittent pain that he rates as a 3/10.  He has been doing his HEP per PT.  Dx imaging: 05/20/20 L shoulder XR  Pertinent review of systems: No fevers or chills  Relevant historical information: 2 prior shoulder surgeries   Exam:  BP 130/72 (BP Location: Right Arm, Patient Position: Sitting, Cuff Size: Normal)   Pulse 62   Ht 5\' 6"  (1.676 m)   Wt 154 lb 3.2 oz (69.9 kg)   SpO2 97%   BMI 24.89 kg/m  General: Well Developed, well nourished, and in no acute distress.   MSK: Left shoulder normal-appearing normal motion nontender normal strength negative impingement testing.    Lab and Radiology Results  EXAM: LEFT SHOULDER - 2+ VIEW   COMPARISON:  No recent prior.   FINDINGS: Acromioclavicular glenohumeral degenerative change. Shoulder appears high-riding. Rotator cuff tear cannot be excluded. No evidence of fracture, dislocation, or separation.   IMPRESSION: 1. Acromioclavicular glenohumeral degenerative change. Shoulder appears high-riding. Rotator cuff tear cannot be excluded.   2.  No acute abnormality identified.     Electronically Signed   By: Marcello Moores  Register   On: 05/21/2020 10:48   I, [name], personally (independently) visualized and performed the interpretation of the images attached in this note.     Assessment and Plan: 70 y.o. male with left shoulder pain. Doing quite well 2 months after injection and 3 sessions of PT and home exercise plans.   Plan to continue conservative management and watchful waiting.  Stressed the importance of ongoing home exercise program.  Recheck back as needed.  Should pain return may consider repeat physical therapy or potentially repeat injection.    Discussed warning signs or symptoms. Please see discharge instructions. Patient expresses understanding.   The above documentation has been reviewed and is accurate and complete [name], M.D.   Total encounter time 20 minutes including face-to-face time with the patient and, reviewing past medical record, and charting on the date of service.   Treatment plan options and next steps.

## 2020-07-14 ENCOUNTER — Encounter: Payer: Self-pay | Admitting: Family Medicine

## 2020-07-14 ENCOUNTER — Other Ambulatory Visit: Payer: Self-pay

## 2020-07-14 ENCOUNTER — Ambulatory Visit: Payer: Medicare HMO | Admitting: Family Medicine

## 2020-07-14 VITALS — BP 130/72 | HR 62 | Ht 66.0 in | Wt 154.2 lb

## 2020-07-14 DIAGNOSIS — M25512 Pain in left shoulder: Secondary | ICD-10-CM

## 2020-07-14 DIAGNOSIS — G8929 Other chronic pain: Secondary | ICD-10-CM

## 2020-07-14 NOTE — Patient Instructions (Signed)
Thank you for coming in today.   Keep up those exercises.   Recheck as needed.   I am here for you.

## 2020-07-27 ENCOUNTER — Other Ambulatory Visit: Payer: Self-pay | Admitting: Family Medicine

## 2020-09-08 NOTE — Progress Notes (Signed)
I, [name], LAT, ATC, am serving as scribe for Dr. [name].  Dennis Villarreal is a 70 y.o. male who presents to [place] at [facility] today for f/u of L shoulder pain.  He was last seen by [name] on 07/14/20 and noted improvement in his symptoms.  He was advised to con't his HEP per PT.  He has completed 3 PT sessions but none since his last visit w/ [name].  He had a prior L subacromial steroid injection on 05/20/20.  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 Today, pt reports the pain in his L shoulder has changed to a "deep ache" that's been ongoing for about 2 weeks. Pt contributes the pain to over-working it.   Diagnostic testing: L shoulder XR- 05/20/20  Pertinent review of systems: No fevers or chills  Relevant historical information: History of melanoma.   Exam:  BP 128/86   Pulse 72   Ht '5\' 6"'$  (1.676 m)   Wt 151 lb 12.8 oz (68.9 kg)   SpO2 97%   BMI 24.50 kg/m  General: Well Developed, well nourished, and in no acute distress.   MSK: Left shoulder normal-appearing Nontender. Decreased range of motion to abduction with pain. Intact strength.    Lab and Radiology Results  EXAM: LEFT SHOULDER - 2+ VIEW   COMPARISON:  No recent prior.   FINDINGS: Acromioclavicular glenohumeral degenerative change. Shoulder appears high-riding. Rotator cuff tear cannot be excluded. No evidence of fracture, dislocation, or separation.   IMPRESSION: 1. Acromioclavicular glenohumeral degenerative change. Shoulder appears high-riding. Rotator cuff tear cannot be excluded.   2.  No acute abnormality identified.     Electronically Signed   By: Marcello Moores  Register   On: 05/21/2020 10:48 I, [name], personally (independently) visualized and performed the interpretation of the images attached in this note.      Assessment and Plan: 70 y.o. male with left shoulder pain.  Patient has left shoulder pain ongoing now for several months.   He was seen for this back in late April and thought to have subacromial bursitis and rotator cuff tendinopathy.  He had a subacromial injection which worked very well and has had physical therapy which helped some.  Unfortunately the pain has become somewhat recurrent and bothersome.  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  He notes that overhead work is especially bothersome.  At this point he is failing conservative management and after discussion plan for MRI to further characterize cause of pain and for potential surgical planning.  Recheck after MRI.  Consider either repeat injection or referral to orthopedic surgery.   PDMP not reviewed this encounter. Orders Placed This Encounter  Procedures   MR SHOULDER LEFT WO CONTRAST    Standing Status:   Future    Standing Expiration Date:   09/09/2021    Order Specific Question:   What is the patient's sedation requirement?    Answer:   No Sedation    Order Specific Question:   Does the patient have a pacemaker or implanted devices?    Answer:   No    Order Specific Question:   Preferred imaging location?    Answer:   [facility] (table limit-550lbs)   No orders of the defined types were placed in this encounter.    Discussed warning signs or symptoms. Please see discharge instructions. Patient expresses understanding.   The above documentation has been reviewed and  is accurate and complete [name], M.D.

## 2020-09-09 ENCOUNTER — Ambulatory Visit: Payer: Medicare HMO | Admitting: Family Medicine

## 2020-09-09 ENCOUNTER — Other Ambulatory Visit: Payer: Self-pay | Admitting: Family Medicine

## 2020-09-09 ENCOUNTER — Other Ambulatory Visit: Payer: Self-pay

## 2020-09-09 ENCOUNTER — Ambulatory Visit: Payer: Self-pay

## 2020-09-09 VITALS — BP 128/86 | HR 72 | Ht 66.0 in | Wt 151.8 lb

## 2020-09-09 DIAGNOSIS — G8929 Other chronic pain: Secondary | ICD-10-CM | POA: Diagnosis not present

## 2020-09-09 DIAGNOSIS — M25512 Pain in left shoulder: Secondary | ICD-10-CM | POA: Diagnosis not present

## 2020-09-09 NOTE — Patient Instructions (Signed)
Thank you for coming in today.   You should hear from MRI scheduling within 1 week. If you do not hear please let me know.    After the MRI return and we can go over the results and if and injection is needed we can do the shot.   If surgery makes sense I can get you to a good surgeon.

## 2020-09-17 ENCOUNTER — Telehealth: Payer: Self-pay | Admitting: Family Medicine

## 2020-09-17 NOTE — Telephone Encounter (Signed)
Approved:Exp Mar 16 2021  [place]: [phone]

## 2020-09-29 ENCOUNTER — Ambulatory Visit
Admission: RE | Admit: 2020-09-29 | Discharge: 2020-09-29 | Disposition: A | Discharge status: Home / Self Care | Payer: Medicare HMO | Source: Ambulatory Visit | Attending: Family Medicine | Admitting: Family Medicine

## 2020-09-29 DIAGNOSIS — G8929 Other chronic pain: Secondary | ICD-10-CM

## 2020-09-29 DIAGNOSIS — M7552 Bursitis of left shoulder: Secondary | ICD-10-CM | POA: Diagnosis not present

## 2020-09-29 DIAGNOSIS — M25512 Pain in left shoulder: Secondary | ICD-10-CM

## 2020-09-29 DIAGNOSIS — M19012 Primary osteoarthritis, left shoulder: Secondary | ICD-10-CM | POA: Diagnosis not present

## 2020-09-29 DIAGNOSIS — M75102 Unspecified rotator cuff tear or rupture of left shoulder, not specified as traumatic: Secondary | ICD-10-CM | POA: Diagnosis not present

## 2020-09-29 IMAGING — MR MR SHOULDER*L* W/O CM
4 of 5 series · 19 of 40 positions shown · non-contrast
Comparison: X-ray [DATE]

CLINICAL DATA: Shoulder pain, bursitis suspected, xray done

EXAM:
MRI OF THE LEFT SHOULDER WITHOUT CONTRAST
TECHNIQUE: Multiplanar, multisequence MR imaging of the shoulder was performed.
No intravenous contrast was administered.

[Series 5: T2 fat-sat · axial · left · 3.0mm · 0.47mm/px · z∈[-33,+48]mm · 5 of 25 slices shown (1 of 3)]
[im 1/25]
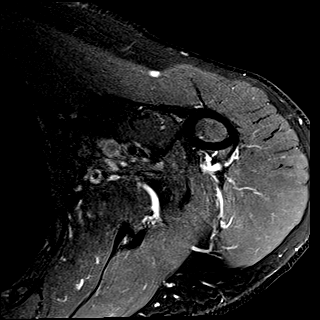
[im 3/25]
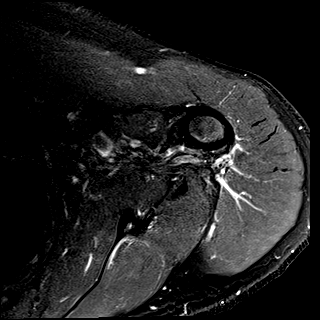
[im 5/25]
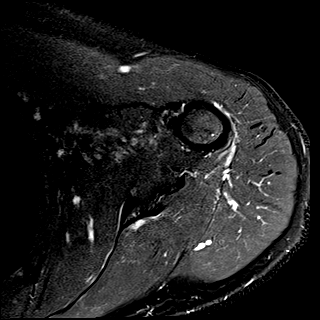
[im 13/25]
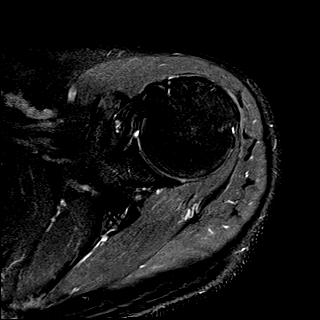
[im 22/25]
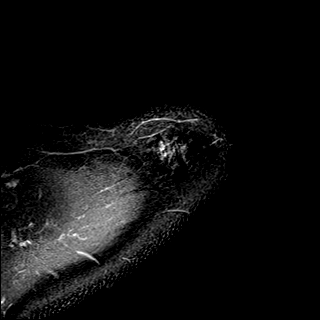

[Series 6: T2 fat-sat · oblique · left · 4.0mm · 0.22mm/px · 3 of 19 slices shown (2 of 3)]
[im 4/19]
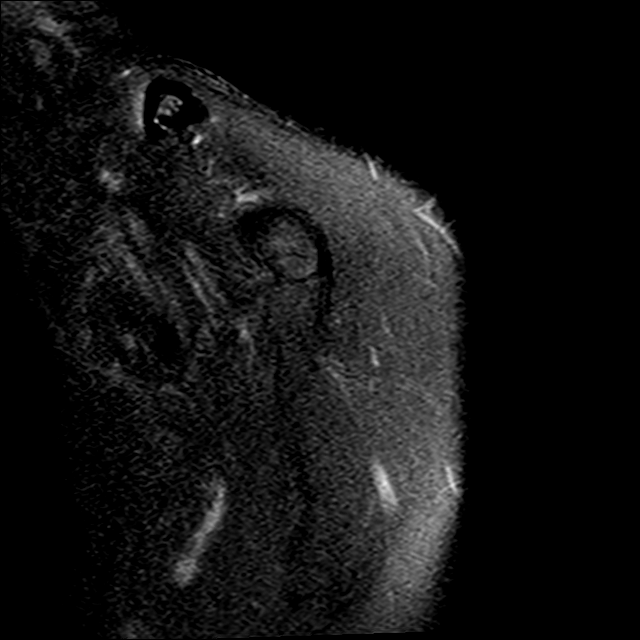
[im 10/19]
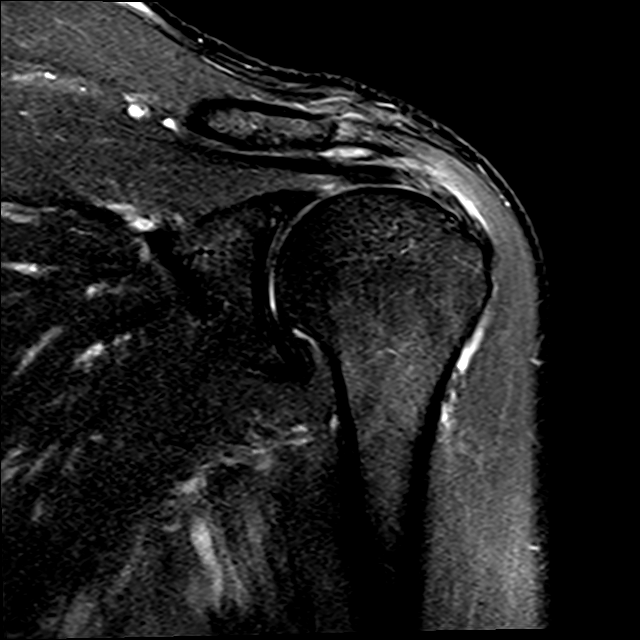
[im 16/19]
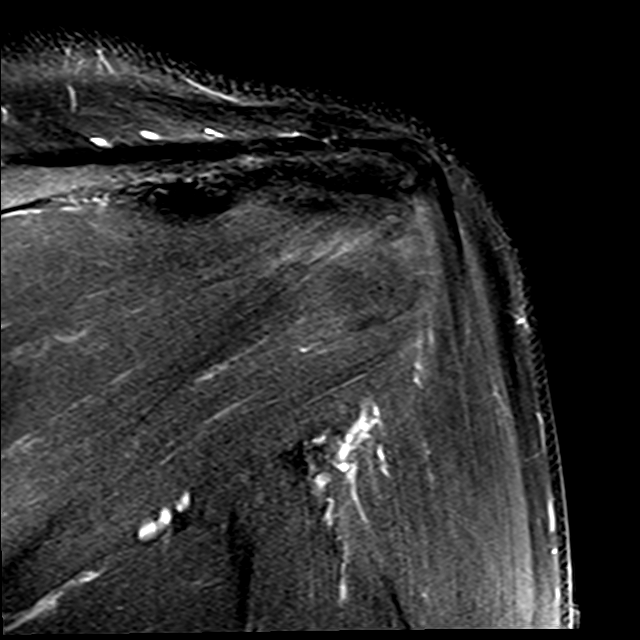

[Series 7: PD · oblique · left · 4.0mm · 0.22mm/px · 8 of 21 slices shown]
[im 1/21]
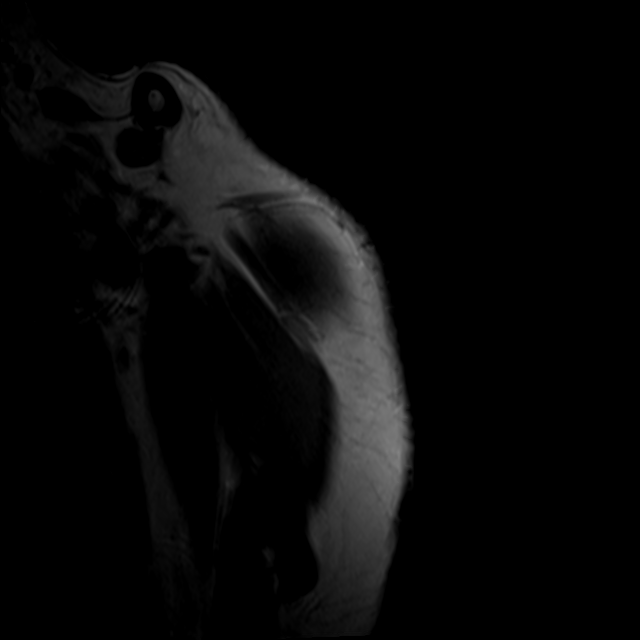
[im 3/21]
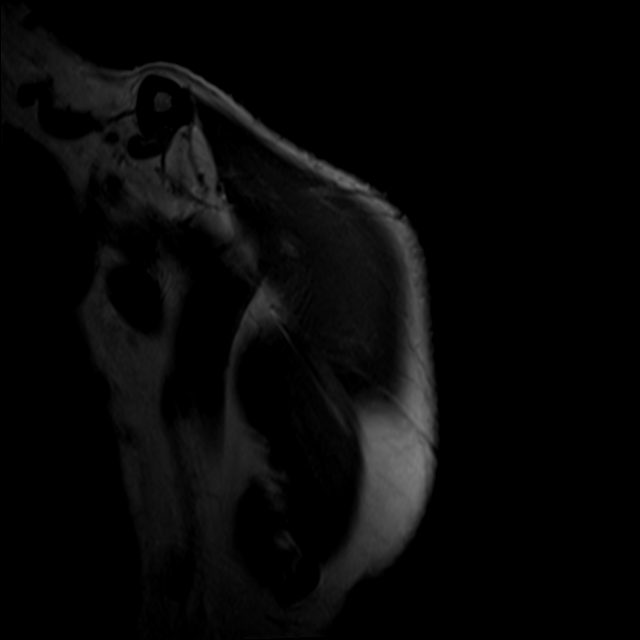
[im 6/21]
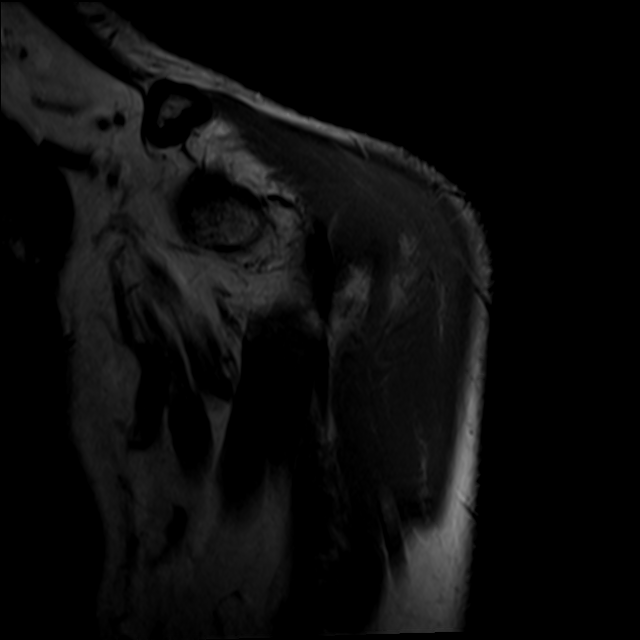
[im 9/21]
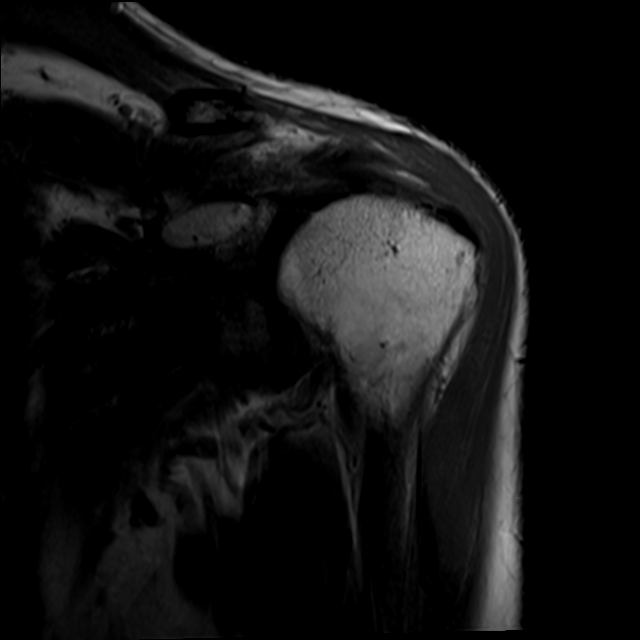
[im 12/21]
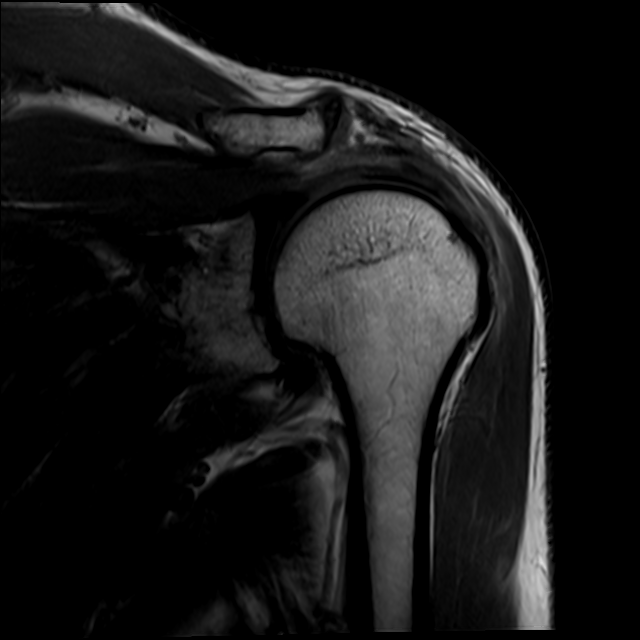
[im 15/21]
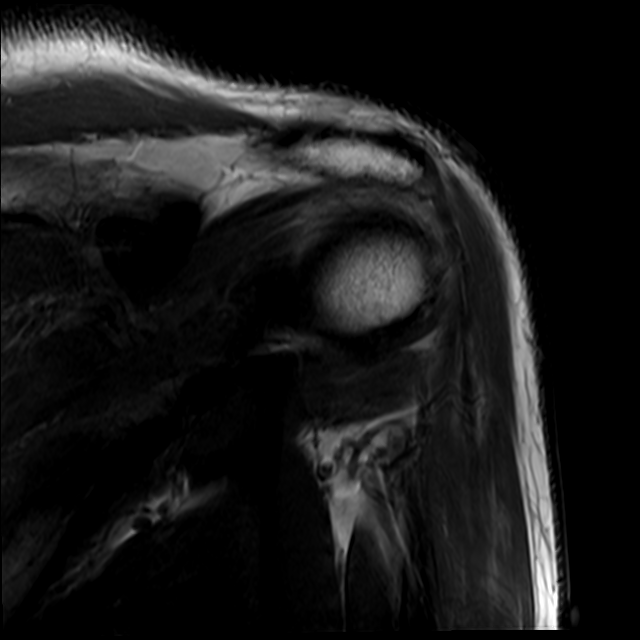
[im 18/21]
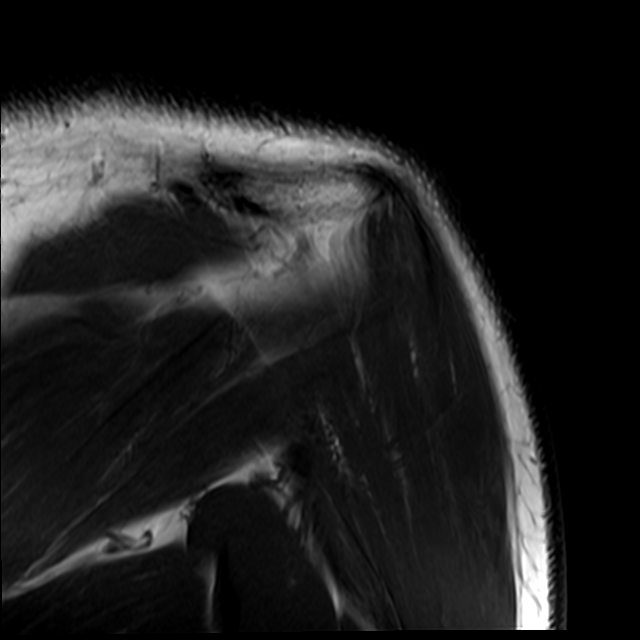
[im 21/21]
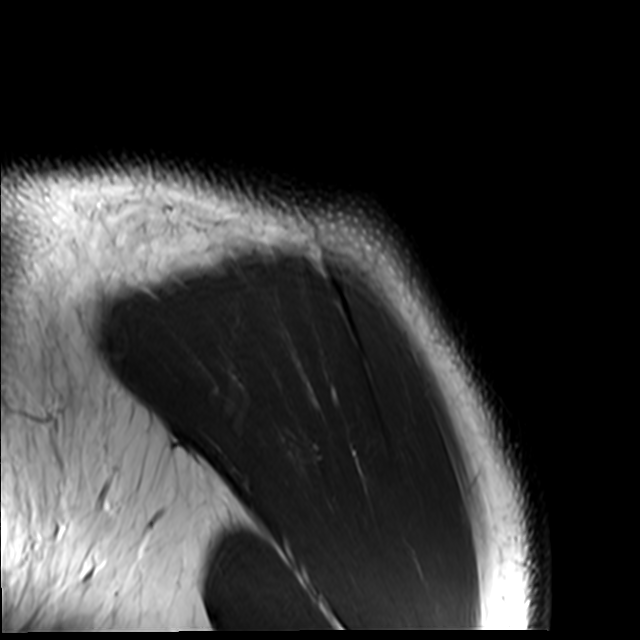

[Series 8: T2 fat-sat · oblique · left · 4.0mm · 0.44mm/px · 3 of 19 slices shown (3 of 3)]
[im 4/19]
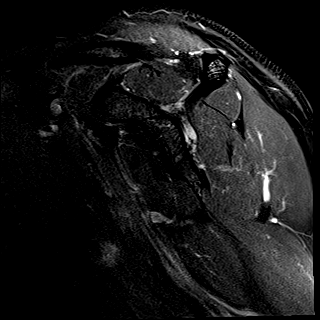
[im 10/19]
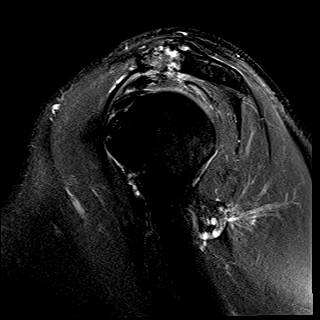
[im 16/19]
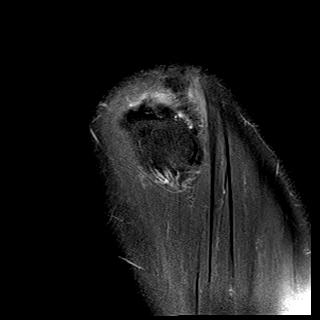

[19 of 40 positions shown; findings below may reference images not displayed]

FINDINGS: Rotator cuff: Moderate supraspinatus tendinosis with
partial-thickness bursal and articular sided fraying of the distal
tendon anteriorly (series 6, image 12). No full-thickness tear. Mild
infraspinatus tendinosis. Subscapularis and teres minor tendons
intact.

Muscles: Preserved bulk and signal intensity of the rotator cuff
musculature without edema, atrophy, or fatty infiltration.

Biceps long head:  Intact.

Acromioclavicular Joint: Moderate arthropathy of the AC joint. Small
volume subacromial-subdeltoid bursal fluid.

Glenohumeral Joint: No joint effusion. No chondral defect.

Labrum: Grossly intact, but evaluation is limited by lack of
intraarticular fluid.

Bones:  No marrow abnormality, fracture or dislocation.

Other: None.
IMPRESSION: 1. Moderate supraspinatus tendinosis with partial-thickness bursal
and articular sided fraying of the distal tendon anteriorly. No
full-thickness tear.
2. Mild infraspinatus tendinosis.
3. Mild subacromial-subdeltoid bursitis.
4. Moderate AC joint arthropathy.

## 2020-09-30 NOTE — Progress Notes (Signed)
Left shoulder MRI shows medium tendinitis and some fraying of the supraspinatus tendon in the rotator cuff without a full-thickness tear.  Additionally there is some other mild rotator cuff tendinitis and some bursitis present causing pain.  This could be treated with injection again.  If this continues to become recurrent problem a decompression surgery could be helpful.  Recommend return to clinic to discuss the MRI results and proceed with repeat injection.

## 2020-09-30 NOTE — Progress Notes (Signed)
I, [name], LAT, ATC, am serving as scribe for Dr. [name].  Dennis Villarreal is a 70 y.o. male who presents to [place] at [facility] today for f/u of L shoulder pain and L shoulder MRI review.  He was last seen by [name] on 09/09/20 and was referred for a L shoulder MRI.  He had a prior course of PT and had a L subacromial injection on 05/20/20.  Today, pt reports that his L shoulder con't to hurt but this is not constant in nature.  He notes that the location of his pain shifts from his L superior shoulder to his L post shoulder.  He states that his L shoulder will hurt both at rest and w/ activity.  Diagnostic testing: L shoulder MRI- 09/29/20; L shoulder XR- 05/20/20  Pertinent review of systems: No fevers or chills  Relevant historical information: History left open shoulder surgery in the 80s thought to be some sort of decompression surgery.   Exam:  BP (!) 148/82 (BP Location: Right Arm, Patient Position: Sitting, Cuff Size: Normal)   Pulse 66   Ht '5\' 6"'$  (1.676 m)   Wt 153 lb 3.2 oz (69.5 kg)   SpO2 97%   BMI 24.73 kg/m  General: Well Developed, well nourished, and in no acute distress.   MSK: Left shoulder mature scar anterior shoulder.  Normal motion.  Pain with abduction.    Lab and Radiology Results No results found for this or any previous visit (from the past 72 hour(s)). MR SHOULDER LEFT WO CONTRAST  Result Date: 09/29/2020 CLINICAL DATA:  Shoulder pain, bursitis suspected, xray done EXAM: MRI OF THE LEFT SHOULDER WITHOUT CONTRAST TECHNIQUE: Multiplanar, multisequence MR imaging of the shoulder was performed. No intravenous contrast was administered. COMPARISON:  X-ray 05/20/2020 FINDINGS: Rotator cuff: Moderate supraspinatus tendinosis with partial-thickness bursal and articular sided fraying of the distal tendon anteriorly (series 6, image 12). No full-thickness tear. Mild infraspinatus tendinosis. Subscapularis and teres minor tendons  intact. Muscles: Preserved bulk and signal intensity of the rotator cuff musculature without edema, atrophy, or fatty infiltration. Biceps long head:  Intact. Acromioclavicular Joint: Moderate arthropathy of the AC joint. Small volume subacromial-subdeltoid bursal fluid. Glenohumeral Joint: No joint effusion. No chondral defect. Labrum: Grossly intact, but evaluation is limited by lack of intraarticular fluid. Bones:  No marrow abnormality, fracture or dislocation. Other: None. IMPRESSION: 1. Moderate supraspinatus tendinosis with partial-thickness bursal and articular sided fraying of the distal tendon anteriorly. No full-thickness tear. 2. Mild infraspinatus tendinosis. 3. Mild subacromial-subdeltoid bursitis. 4. Moderate AC joint arthropathy. Electronically Signed   By: Davina Poke D.O.   On: 09/29/2020 15:27    I, [name], personally (independently) visualized and performed the interpretation of the images attached in this note.  Procedure: Real-time Ultrasound Guided Injection of left shoulder subacromial bursa Device: Philips Affiniti 50G Images permanently stored and available for review in PACS Verbal informed consent obtained.  Discussed risks and benefits of procedure. Warned about infection bleeding damage to structures skin hypopigmentation and fat atrophy among others. Patient expresses understanding and agreement Time-out conducted.   Noted no overlying erythema, induration, or other signs of local infection.   Skin prepped in a sterile fashion.   Local anesthesia: Topical Ethyl chloride.   With sterile technique and under real time ultrasound guidance:  '40mg'$  kenalog and 13m marcaine injected into sub ac bursa. Fluid seen entering the bursa.   Completed without difficulty   Pain immediately resolved suggesting accurate  placement of the medication.   Advised to call if fevers/chills, erythema, induration, drainage, or persistent bleeding.   Images permanently stored and  available for review in the ultrasound unit.  Impression: Technically successful ultrasound guided injection.      Assessment and Plan: 70 y.o. male with left shoulder pain due to subacromial bursitis and shoulder impingement and rotator cuff tendinopathy.  Plan for subacromial injection and continued home exercise program previously taught by PT.  If pain should return after trial of continued conservative management would recommend consultation with orthopedic surgery to discuss surgical options. Reviewed MRI findings with patient and discussed treatment plan and options.  PDMP not reviewed this encounter. Orders Placed This Encounter  Procedures   [place] LIMITED JOINT SPACE STRUCTURES UP LEFT(NO LINKED CHARGES)    Order Specific Question:   Reason for Exam (SYMPTOM  OR DIAGNOSIS REQUIRED)    Answer:   L shoulder pain    Order Specific Question:   Preferred imaging location?    Answer:   [place]   No orders of the defined types were placed in this encounter.    Discussed warning signs or symptoms. Please see discharge instructions. Patient expresses understanding.   The above documentation has been reviewed and is accurate and complete [name], M.D.

## 2020-10-01 ENCOUNTER — Encounter: Payer: Self-pay | Admitting: Family Medicine

## 2020-10-01 ENCOUNTER — Ambulatory Visit: Payer: Self-pay

## 2020-10-01 ENCOUNTER — Other Ambulatory Visit: Payer: Self-pay

## 2020-10-01 ENCOUNTER — Ambulatory Visit: Payer: Medicare HMO | Admitting: Family Medicine

## 2020-10-01 VITALS — BP 148/82 | HR 66 | Ht 66.0 in | Wt 153.2 lb

## 2020-10-01 DIAGNOSIS — G8929 Other chronic pain: Secondary | ICD-10-CM | POA: Diagnosis not present

## 2020-10-01 DIAGNOSIS — M25512 Pain in left shoulder: Secondary | ICD-10-CM | POA: Diagnosis not present

## 2020-10-01 NOTE — Patient Instructions (Addendum)
Good to see you today.  You had a L shoulder injection.  Call or go to the ER if you develop a large red swollen joint with extreme pain or oozing puss.   Continue home exercises.   Return as needed.   If this returns quickly it may be reasonable to talk with a surgeon.

## 2020-10-19 ENCOUNTER — Telehealth: Payer: Self-pay | Admitting: Family Medicine

## 2020-11-03 ENCOUNTER — Other Ambulatory Visit: Payer: Self-pay

## 2020-11-03 ENCOUNTER — Ambulatory Visit (INDEPENDENT_AMBULATORY_CARE_PROVIDER_SITE_OTHER): Payer: Medicare HMO

## 2020-11-03 DIAGNOSIS — Z0000 Encounter for general adult medical examination without abnormal findings: Secondary | ICD-10-CM

## 2020-11-03 DIAGNOSIS — Z23 Encounter for immunization: Secondary | ICD-10-CM | POA: Diagnosis not present

## 2020-11-03 NOTE — Progress Notes (Addendum)
Virtual Visit via Telephone Note  I connected with  Olen Cordial on 11/03/20 at 11:45 AM EDT by telephone and verified that I am speaking with the correct person using two identifiers.  Location: Patient: Home Provider: Office Persons participating in the virtual visit: patient/Nurse Health Advisor   I discussed the limitations, risks, security and privacy concerns of performing an evaluation and management service by telephone and the availability of in person appointments. The patient expressed understanding and agreed to proceed.  Interactive audio and video telecommunications were attempted between this nurse and patient, however failed, due to patient having technical difficulties OR patient did not have access to video capability.  We continued and completed visit with audio only.  Some vital signs may be absent or patient reported.   Willette Brace, LPN   Subjective:   COLLAN SCHOENFELD is a 70 y.o. male who presents for Medicare Annual/Subsequent preventive examination.  Review of Systems     Cardiac Risk Factors include: advanced age (>8men, >44 women);dyslipidemia;male gender     Objective:    There were no vitals filed for this visit. There is no height or weight on file to calculate BMI.  Advanced Directives 11/03/2020 06/08/2020 12/02/2019  Does Patient Have a Medical Advance Directive? No No No  Would patient like information on creating a medical advance directive? Yes (MAU/Ambulatory/Procedural Areas - Information given) Yes (MAU/Ambulatory/Procedural Areas - Information given) No - Patient declined    Current Medications (verified) Outpatient Encounter Medications as of 11/03/2020  Medication Sig   atorvastatin (LIPITOR) 20 MG tablet TAKE ONE TABLET BY MOUTH DAILY   hydrocortisone 2.5 % cream 1 application   ibuprofen (ADVIL) 800 MG tablet Take 1 tablet (800 mg total) by mouth every 8 (eight) hours as needed. with food.  Do not recommend prolonged use  given age and high risk for GI problems such as ulcer   magnesium oxide (MAG-OX) 400 MG tablet Take 400 mg by mouth daily.   multivitamin (ONE-A-DAY MEN'S) TABS Take 1 tablet by mouth daily.   pantoprazole (PROTONIX) 40 MG tablet Take 1 tablet (40 mg total) by mouth daily.   psyllium (METAMUCIL) 58.6 % powder Take 1 packet by mouth daily.   tamsulosin (FLOMAX) 0.4 MG CAPS capsule TAKE ONE CAPSULE BY MOUTH DAILY   [DISCONTINUED] HYDROcodone-acetaminophen (NORCO/VICODIN) 5-325 MG tablet Take 1 tablet by mouth every 6 (six) hours as needed. (Patient not taking: Reported on 11/03/2020)   [DISCONTINUED] tadalafil (CIALIS) 5 MG tablet TAKE ONE TABLET BY MOUTH DAILY (Patient not taking: Reported on 11/03/2020)   No facility-administered encounter medications on file as of 11/03/2020.    Allergies (verified) Levofloxacin, Naproxen, Other, and Oxycodone-acetaminophen   History: Past Medical History:  Diagnosis Date   ALLERGIC RHINITIS 09/04/2009   Basal cell carcinoma    Cancer ([place]) 2012   melanoma   DYSLIPIDEMIA 09/04/2009   GERD 09/04/2009   High cholesterol    just above borderline per patient    Past Surgical History:  Procedure Laterality Date   GALLBLADDER SURGERY  01/2018   SHOULDER SURGERY  1986, 2009   left shoulder scromoplasty   TONSILLECTOMY     TRIGGER FINGER RELEASE Left    Family History  Problem Relation Age of Onset   Heart disease Mother        CVA and cardiac arrhythmia, ? atrial fib   Stroke Mother    Hypertension Mother    Melanoma Father    Glaucoma Father    Glaucoma  Brother    Social History   Socioeconomic History   Marital status: Married    Spouse name: Not on file   Number of children: 3   Years of education: College   Highest education level: Not on file  Occupational History   Occupation: TEFL teacher      Comment: Estate manager/land agent  Tobacco Use   Smoking status: Former    Packs/day: 1.00    Years: 20.00    Pack years: 20.00     Types: Cigarettes    Quit date: 01/24/1982    Years since quitting: 38.8   Smokeless tobacco: Never  Vaping Use   Vaping Use: Never used  Substance and Sexual Activity   Alcohol use: Yes    Alcohol/week: 0.0 standard drinks    Comment: socially   Drug use: No   Sexual activity: Not on file  Other Topics Concern   Not on file  Social History Narrative   Lives at home with wife and youngest daughter.   Has 3 children.    Caffeine: 2 cups/day    Social Determinants of Health   Financial Resource Strain: Low Risk    Difficulty of Paying Living Expenses: Not hard at all  Food Insecurity: No Food Insecurity   Worried About Charity fundraiser in the Last Year: Never true   Arboriculturist in the Last Year: Never true  Transportation Needs: No Transportation Needs   Lack of Transportation (Medical): No   Lack of Transportation (Non-Medical): No  Physical Activity: Inactive   Days of Exercise per Week: 0 days   Minutes of Exercise per Session: 0 min  Stress: No Stress Concern Present   Feeling of Stress : Not at all  Social Connections: Moderately Integrated   Frequency of Communication with Friends and Family: More than three times a week   Frequency of Social Gatherings with Friends and Family: More than three times a week   Attends Religious Services: Never   Marine scientist or Organizations: Yes   Attends Music therapist: 1 to 4 times per year   Marital Status: Married    Tobacco Counseling Counseling given: Not Answered   Clinical Intake:  Pre-visit preparation completed: Yes  Pain : No/denies pain     BMI - recorded: 24.74 Nutritional Status: BMI of 19-24  Normal Nutritional Risks: None Diabetes: No  How often do you need to have someone help you when you read instructions, pamphlets, or other written materials from your doctor or pharmacy?: 1 - Never  Diabetic?no  Interpreter Needed?: No  Information entered by :: Charlott Rakes,  LPN   Activities of Daily Living In your present state of health, do you have any difficulty performing the following activities: 11/03/2020 12/02/2019  Hearing? Tempie Donning  Comment HOH don't wear hearing aids has bilateral hearing aids  Vision? N N  Difficulty concentrating or making decisions? N N  Walking or climbing stairs? N N  Dressing or bathing? N N  Doing errands, shopping? N N  Preparing Food and eating ? N N  Using the Toilet? N N  In the past six months, have you accidently leaked urine? Y Y  Comment pt stated on occassions has occassional dribble with urine  Do you have problems with loss of bowel control? Y Y  Comment - has some issues with bowel incontinence if eats wrong foods since has hx of gallbladder removal  Managing your Medications? N N  Comment pt stated on occassions -  Managing your Finances? N N  Housekeeping or managing your Housekeeping? N N  Some recent data might be hidden    Patient Care Team: [name], MD as PCP - General  Indicate any recent Medical Services you may have received from other than [facility] providers in the past year (date may be approximate).     Assessment:   This is a routine wellness examination for Jarryn.  Hearing/Vision screen Hearing Screening - Comments:: Pt stated HOH but don't wear hearing aids  Vision Screening - Comments:: Pt follows up with Dr Jodene Nam for annual eye exams  Dietary issues and exercise activities discussed: Current Exercise Habits: The patient has a physically strenuous job, but has no regular exercise apart from work.   Goals Addressed             This Visit's Progress    Patient Stated       None at this time        Depression Screen PHQ 2/9 Scores 11/03/2020 12/23/2019 12/23/2019 12/02/2019  PHQ - 2 Score 0 0 0 0  PHQ- 9 Score - 5 - 0    Fall Risk Fall Risk  11/03/2020 12/23/2019 12/02/2019  Falls in the past year? 0 0 0  Number falls in past yr: 0 - 0  Injury with Fall? 0  - 0  Risk for fall due to : Impaired vision - No Fall Risks  Follow up Falls prevention discussed - Falls evaluation completed;Falls prevention discussed    FALL RISK PREVENTION PERTAINING TO THE HOME:  Any stairs in or around the home? Yes  If so, are there any without handrails? No  Home free of loose throw rugs in walkways, pet beds, electrical cords, etc? Yes  Adequate lighting in your home to reduce risk of falls? Yes   ASSISTIVE DEVICES UTILIZED TO PREVENT FALLS:  Life alert? No  Use of a cane, walker or w/c? No  Grab bars in the bathroom? Yes  Shower chair or bench in shower? No  Elevated toilet seat or a handicapped toilet? No   TIMED UP AND GO:  Was the test performed? No .   Cognitive Function:     6CIT Screen 11/03/2020  What Year? 0 points  What month? 0 points  What time? 0 points  Count back from 20 0 points  Months in reverse 0 points  Repeat phrase 0 points  Total Score 0    Immunizations Immunization History  Administered Date(s) Administered   Influenza Split 11/03/2012   Influenza, High Dose Seasonal PF 12/07/2016, 12/12/2017, 10/25/2018, 11/04/2019   Influenza-Unspecified 10/29/2013, 10/25/2015, 11/24/2016   PFIZER(Purple Top)SARS-COV-2 Vaccination 03/02/2019, 03/23/2019, 10/29/2019   Pneumococcal Conjugate-13 01/16/2017   Tdap 04/02/2014   Zoster Recombinat (Shingrix) 06/30/2017, 12/22/2017    TDAP status: Up to date  Flu Vaccine status: Due, Education has been provided regarding the importance of this vaccine. Advised may receive this vaccine at local pharmacy or Health Dept. Aware to provide a copy of the vaccination record if obtained from local pharmacy or Health Dept. Verbalized acceptance and understanding.  Pneumococcal vaccine status: Up to date  Covid-19 vaccine status: Completed vaccines  Qualifies for Shingles Vaccine? Yes   Zostavax completed Yes   Shingrix Completed?: Yes  Screening Tests Health Maintenance  Topic Date  Due   COVID-19 Vaccine (4 - Booster for Pfizer series) 01/21/2020   INFLUENZA VACCINE  08/24/2020   TETANUS/TDAP  04/01/2024   COLONOSCOPY (  Pts 45-21yrs Insurance coverage will need to be confirmed)  08/23/2028   Hepatitis C Screening  Completed   Zoster Vaccines- Shingrix  Completed   HPV VACCINES  Aged Out    Health Maintenance  Health Maintenance Due  Topic Date Due   COVID-19 Vaccine (4 - Booster for Pfizer series) 01/21/2020   INFLUENZA VACCINE  08/24/2020    Colorectal cancer screening: Type of screening: Colonoscopy. Completed 07/3118. Repeat every 10 years   Additional Screening:  Hepatitis C Screening:  Completed 01/16/17  Vision Screening: Recommended annual ophthalmology exams for early detection of glaucoma and other disorders of the eye. Is the patient up to date with their annual eye exam?  Yes  Who is the provider or what is the name of the office in which the patient attends annual eye exams? Dr Jodene Nam If pt is not established with a provider, would they like to be referred to a provider to establish care? No .   Dental Screening: Recommended annual dental exams for proper oral hygiene  Community Resource Referral / Chronic Care Management: CRR required this visit?  No   CCM required this visit?  No      Plan:     I have personally reviewed and noted the following in the patient's chart:   Medical and social history Use of alcohol, tobacco or illicit drugs  Current medications and supplements including opioid prescriptions. Patient is not currently taking opioid prescriptions. Functional ability and status Nutritional status Physical activity Advanced directives List of other physicians Hospitalizations, surgeries, and ER visits in previous 12 months Vitals Screenings to include cognitive, depression, and falls Referrals and appointments  In addition, I have reviewed and discussed with patient certain preventive protocols, quality metrics,  and best practice recommendations. A written personalized care plan for preventive services as well as general preventive health recommendations were provided to patient.     Willette Brace, LPN   [id number]   Nurse Notes: pt stated he wanted to have a refill called in of tadalafil. He stated he left message for nurse to ask if that could be done. The RX [facility] [address] . Phone number [phone]. Please advise

## 2020-11-03 NOTE — Patient Instructions (Signed)
[name] , Thank you for taking time to come for your Medicare Wellness Visit. I appreciate your ongoing commitment to your health goals. Please review the following plan we discussed and let me know if I can assist you in the future.   Screening recommendations/referrals: Colonoscopy: Done 08/24/18 repeat every 10 years  Recommended yearly ophthalmology/optometry visit for glaucoma screening and checkup Recommended yearly dental visit for hygiene and checkup  Vaccinations: Influenza vaccine: Due and discussed Pneumococcal vaccine: Up to date Tdap vaccine: Done 04/02/14 repeat every 10 years  Shingles vaccine: Completed 6//7/ & 12/22/17   Covid-19: Completed 2/6, 2/27, & 10/28/20  Advanced directives: Advance directive discussed with you today. I have provided a copy for you to complete at home and have notarized. Once this is complete please bring a copy in to our office so we can scan it into your chart.  Conditions/risks identified: none at this time  Next appointment: Follow up in one year for your annual wellness visit.   Preventive Care 70 Years and Older, Male Preventive care refers to lifestyle choices and visits with your health care provider that can promote health and wellness. What does preventive care include? A yearly physical exam. This is also called an annual well check. Dental exams once or twice a year. Routine eye exams. Ask your health care provider how often you should have your eyes checked. Personal lifestyle choices, including: Daily care of your teeth and gums. Regular physical activity. Eating a healthy diet. Avoiding tobacco and drug use. Limiting alcohol use. Practicing safe sex. Taking low doses of aspirin every day. Taking vitamin and mineral supplements as recommended by your health care provider. What happens during an annual well check? The services and screenings done by your health care provider during your annual well check will depend on your  age, overall health, lifestyle risk factors, and family history of disease. Counseling  Your health care provider may ask you questions about your: Alcohol use. Tobacco use. Drug use. Emotional well-being. Home and relationship well-being. Sexual activity. Eating habits. History of falls. Memory and ability to understand (cognition). Work and work Statistician. Screening  You may have the following tests or measurements: Height, weight, and BMI. Blood pressure. Lipid and cholesterol levels. These may be checked every 5 years, or more frequently if you are over 34 years old. Skin check. Lung cancer screening. You may have this screening every year starting at age 70 if you have a 30-pack-year history of smoking and currently smoke or have quit within the past 15 years. Fecal occult blood test (FOBT) of the stool. You may have this test every year starting at age 70. Flexible sigmoidoscopy or colonoscopy. You may have a sigmoidoscopy every 5 years or a colonoscopy every 10 years starting at age 70. Prostate cancer screening. Recommendations will vary depending on your family history and other risks. Hepatitis C blood test. Hepatitis B blood test. Sexually transmitted disease (STD) testing. Diabetes screening. This is done by checking your blood sugar (glucose) after you have not eaten for a while (fasting). You may have this done every 1-3 years. Abdominal aortic aneurysm (AAA) screening. You may need this if you are a current or former smoker. Osteoporosis. You may be screened starting at age 70 if you are at high risk. Talk with your health care provider about your test results, treatment options, and if necessary, the need for more tests. Vaccines  Your health care provider may recommend certain vaccines, such as: Influenza vaccine. This  is recommended every year. Tetanus, diphtheria, and acellular pertussis (Tdap, Td) vaccine. You may need a Td booster every 10 years. Zoster  vaccine. You may need this after age 70. Pneumococcal 13-valent conjugate (PCV13) vaccine. One dose is recommended after age 70. Pneumococcal polysaccharide (PPSV23) vaccine. One dose is recommended after age 70. Talk to your health care provider about which screenings and vaccines you need and how often you need them. This information is not intended to replace advice given to you by your health care provider. Make sure you discuss any questions you have with your health care provider. Document Released: 02/06/2015 Document Revised: 09/30/2015 Document Reviewed: 11/11/2014 Elsevier Interactive Patient Education  2017 [place] Prevention in the Home Falls can cause injuries. They can happen to people of all ages. There are many things you can do to make your home safe and to help prevent falls. What can I do on the outside of my home? Regularly fix the edges of walkways and driveways and fix any cracks. Remove anything that might make you trip as you walk through a door, such as a raised step or threshold. Trim any bushes or trees on the path to your home. Use bright outdoor lighting. Clear any walking paths of anything that might make someone trip, such as rocks or tools. Regularly check to see if handrails are loose or broken. Make sure that both sides of any steps have handrails. Any raised decks and porches should have guardrails on the edges. Have any leaves, snow, or ice cleared regularly. Use sand or salt on walking paths during winter. Clean up any spills in your garage right away. This includes oil or grease spills. What can I do in the bathroom? Use night lights. Install grab bars by the toilet and in the tub and shower. Do not use towel bars as grab bars. Use non-skid mats or decals in the tub or shower. If you need to sit down in the shower, use a plastic, non-slip stool. Keep the floor dry. Clean up any water that spills on the floor as soon as it happens. Remove  soap buildup in the tub or shower regularly. Attach bath mats securely with double-sided non-slip rug tape. Do not have throw rugs and other things on the floor that can make you trip. What can I do in the bedroom? Use night lights. Make sure that you have a light by your bed that is easy to reach. Do not use any sheets or blankets that are too big for your bed. They should not hang down onto the floor. Have a firm chair that has side arms. You can use this for support while you get dressed. Do not have throw rugs and other things on the floor that can make you trip. What can I do in the kitchen? Clean up any spills right away. Avoid walking on wet floors. Keep items that you use a lot in easy-to-reach places. If you need to reach something above you, use a strong step stool that has a grab bar. Keep electrical cords out of the way. Do not use floor polish or wax that makes floors slippery. If you must use wax, use non-skid floor wax. Do not have throw rugs and other things on the floor that can make you trip. What can I do with my stairs? Do not leave any items on the stairs. Make sure that there are handrails on both sides of the stairs and use them. Fix handrails that  are broken or loose. Make sure that handrails are as long as the stairways. Check any carpeting to make sure that it is firmly attached to the stairs. Fix any carpet that is loose or worn. Avoid having throw rugs at the top or bottom of the stairs. If you do have throw rugs, attach them to the floor with carpet tape. Make sure that you have a light switch at the top of the stairs and the bottom of the stairs. If you do not have them, ask someone to add them for you. What else can I do to help prevent falls? Wear shoes that: Do not have high heels. Have rubber bottoms. Are comfortable and fit you well. Are closed at the toe. Do not wear sandals. If you use a stepladder: Make sure that it is fully opened. Do not climb a  closed stepladder. Make sure that both sides of the stepladder are locked into place. Ask someone to hold it for you, if possible. Clearly mark and make sure that you can see: Any grab bars or handrails. First and last steps. Where the edge of each step is. Use tools that help you move around (mobility aids) if they are needed. These include: Canes. Walkers. Scooters. Crutches. Turn on the lights when you go into a dark area. Replace any light bulbs as soon as they burn out. Set up your furniture so you have a clear path. Avoid moving your furniture around. If any of your floors are uneven, fix them. If there are any pets around you, be aware of where they are. Review your medicines with your doctor. Some medicines can make you feel dizzy. This can increase your chance of falling. Ask your doctor what other things that you can do to help prevent falls. This information is not intended to replace advice given to you by your health care provider. Make sure you discuss any questions you have with your health care provider. Document Released: 11/06/2008 Document Revised: 06/18/2015 Document Reviewed: 02/14/2014 Elsevier Interactive Patient Education  2017 [employer].

## 2020-11-16 DIAGNOSIS — L578 Other skin changes due to chronic exposure to nonionizing radiation: Secondary | ICD-10-CM | POA: Diagnosis not present

## 2020-11-16 DIAGNOSIS — L814 Other melanin hyperpigmentation: Secondary | ICD-10-CM | POA: Diagnosis not present

## 2020-11-16 DIAGNOSIS — Z23 Encounter for immunization: Secondary | ICD-10-CM | POA: Diagnosis not present

## 2020-11-16 DIAGNOSIS — L821 Other seborrheic keratosis: Secondary | ICD-10-CM | POA: Diagnosis not present

## 2020-11-16 DIAGNOSIS — Z85820 Personal history of malignant melanoma of skin: Secondary | ICD-10-CM | POA: Diagnosis not present

## 2020-11-16 DIAGNOSIS — Z86018 Personal history of other benign neoplasm: Secondary | ICD-10-CM | POA: Diagnosis not present

## 2020-11-16 DIAGNOSIS — Z808 Family history of malignant neoplasm of other organs or systems: Secondary | ICD-10-CM | POA: Diagnosis not present

## 2020-11-16 DIAGNOSIS — L820 Inflamed seborrheic keratosis: Secondary | ICD-10-CM | POA: Diagnosis not present

## 2020-11-16 DIAGNOSIS — D225 Melanocytic nevi of trunk: Secondary | ICD-10-CM | POA: Diagnosis not present

## 2020-11-24 ENCOUNTER — Other Ambulatory Visit: Payer: Self-pay

## 2020-11-24 MED ORDER — TADALAFIL 5 MG PO TABS: 5.0000 mg | ORAL_TABLET | Freq: Every day | ORAL | 0 refills | Status: DC

## 2020-11-24 NOTE — Telephone Encounter (Signed)
error 

## 2020-11-24 NOTE — Telephone Encounter (Signed)
Patient called to get a refill on tadalafil (CIALIS) 5 MG tablet. CPE is scheduled for December and patient is wondering if he could get a refill to last until then.    Please send to  [facility] [phone] - [address] [place] Phone:  [phone]  Fax:  [phone]       Please advise

## 2020-11-24 NOTE — Telephone Encounter (Signed)
Rx sent in. Left a detailed message on verified voice mail informing the patient.

## 2020-11-24 NOTE — Telephone Encounter (Signed)
Please advise. Rx is not on the current med list 

## 2020-12-01 ENCOUNTER — Other Ambulatory Visit: Payer: Self-pay | Admitting: Physician Assistant

## 2020-12-01 ENCOUNTER — Other Ambulatory Visit: Payer: Self-pay

## 2020-12-01 ENCOUNTER — Ambulatory Visit (INDEPENDENT_AMBULATORY_CARE_PROVIDER_SITE_OTHER): Payer: Medicare HMO | Admitting: Family Medicine

## 2020-12-01 VITALS — BP 100/60 | HR 60 | Temp 97.9°F | Wt 153.8 lb

## 2020-12-01 DIAGNOSIS — R1310 Dysphagia, unspecified: Secondary | ICD-10-CM | POA: Diagnosis not present

## 2020-12-01 DIAGNOSIS — N50819 Testicular pain, unspecified: Secondary | ICD-10-CM

## 2020-12-01 MED ORDER — GABAPENTIN 100 MG PO CAPS: ORAL_CAPSULE | ORAL | 3 refills | Status: DC

## 2020-12-01 NOTE — Progress Notes (Signed)
Established Patient Office Visit  Subjective:  Patient ID: Dennis Villarreal, male    DOB: 02-05-1950  Age: 70 y.o. MRN: [id number]  CC:  Chief Complaint  Patient presents with   Dysphagia    X 4  weeks, worse within the last week, feels like he is going to choke when eating, worse towards the end of the meal    HPI Dennis Villarreal presents for the following items  4-week history roughly of dysphagia to solid foods.  No problem with liquids.  Symptoms have become progressive and almost daily.  Usually worse at the end of a meal.  He has been taking time to eat slowly and chew his food well.  Does have occasional reflux symptoms.  Taking Protonix 40 mg daily and recently started Pepcid 20 mg twice daily as well.  Denies any appetite change or weight loss.  He actually contacted his gastroenterologist and has been scheduled for barium swallow study on 12-07-2020.  He has subsequent office follow-up on the 29th.  Denies any recent nausea or vomiting.  He thinks he may have had an upper endoscopy years ago.  Not aware of any prior history of stricture.  Second issue is somewhat chronic and intermittent.  He has severe sharp intermittent testicular pain which has had for years.  Occasional achiness upper thighs bilaterally.  Denies any low back pain currently.  No pain with back extension.  No known history of lumbar stenosis.  He has tried things like anti-inflammatories without any improvement.  Symptoms tend to be worse at night when he is laying in bed.  Denies any lower extremity numbness or weakness.  No dysuria.  He has noted when he wears good support briefs he tends to have a little bit less pain.  He has seen urology for this previously.  Past Medical History:  Diagnosis Date   ALLERGIC RHINITIS 09/04/2009   Basal cell carcinoma    Cancer ([place]) 2012   melanoma   DYSLIPIDEMIA 09/04/2009   GERD 09/04/2009   High cholesterol    just above borderline per patient     Past Surgical  History:  Procedure Laterality Date   GALLBLADDER SURGERY  01/2018   SHOULDER SURGERY  1986, 2009   left shoulder scromoplasty   TONSILLECTOMY     TRIGGER FINGER RELEASE Left     Family History  Problem Relation Age of Onset   Heart disease Mother        CVA and cardiac arrhythmia, ? atrial fib   Stroke Mother    Hypertension Mother    Melanoma Father    Glaucoma Father    Glaucoma Brother     Social History   Socioeconomic History   Marital status: Married    Spouse name: Not on file   Number of children: 3   Years of education: College   Highest education level: Not on file  Occupational History   Occupation: TEFL teacher      Comment: Financial planner systems  Tobacco Use   Smoking status: Former    Packs/day: 1.00    Years: 20.00    Pack years: 20.00    Types: Cigarettes    Quit date: 01/24/1982    Years since quitting: 38.8   Smokeless tobacco: Never  Vaping Use   Vaping Use: Never used  Substance and Sexual Activity   Alcohol use: Yes    Alcohol/week: 0.0 standard drinks    Comment: socially   Drug use: No  Sexual activity: Not on file  Other Topics Concern   Not on file  Social History Narrative   Lives at home with wife and youngest daughter.   Has 3 children.    Caffeine: 2 cups/day    Social Determinants of Health   Financial Resource Strain: Low Risk    Difficulty of Paying Living Expenses: Not hard at all  Food Insecurity: No Food Insecurity   Worried About Charity fundraiser in the Last Year: Never true   Arboriculturist in the Last Year: Never true  Transportation Needs: No Transportation Needs   Lack of Transportation (Medical): No   Lack of Transportation (Non-Medical): No  Physical Activity: Inactive   Days of Exercise per Week: 0 days   Minutes of Exercise per Session: 0 min  Stress: No Stress Concern Present   Feeling of Stress : Not at all  Social Connections: Moderately Integrated   Frequency of Communication with Friends  and Family: More than three times a week   Frequency of Social Gatherings with Friends and Family: More than three times a week   Attends Religious Services: Never   Marine scientist or Organizations: Yes   Attends Music therapist: 1 to 4 times per year   Marital Status: Married  Human resources officer Violence: Not At Risk   Fear of Current or Ex-Partner: No   Emotionally Abused: No   Physically Abused: No   Sexually Abused: No    Outpatient Medications Prior to Visit  Medication Sig Dispense Refill   atorvastatin (LIPITOR) 20 MG tablet TAKE ONE TABLET BY MOUTH DAILY 90 tablet 3   hydrocortisone 2.5 % cream 1 application     ibuprofen (ADVIL) 800 MG tablet Take 1 tablet (800 mg total) by mouth every 8 (eight) hours as needed. with food.  Do not recommend prolonged use given age and high risk for GI problems such as ulcer 30 tablet 0   magnesium oxide (MAG-OX) 400 MG tablet Take 400 mg by mouth daily.     multivitamin (ONE-A-DAY MEN'S) TABS Take 1 tablet by mouth daily.     pantoprazole (PROTONIX) 40 MG tablet Take 1 tablet (40 mg total) by mouth daily. 90 tablet 2   psyllium (METAMUCIL) 58.6 % powder Take 1 packet by mouth daily.     tadalafil (CIALIS) 5 MG tablet Take 1 tablet (5 mg total) by mouth daily. 30 tablet 0   tamsulosin (FLOMAX) 0.4 MG CAPS capsule TAKE ONE CAPSULE BY MOUTH DAILY 90 capsule 1   No facility-administered medications prior to visit.    Allergies  Allergen Reactions   Levofloxacin     REACTION: hives and GI upset   Naproxen Hives, Other (See Comments) and Swelling   Other Hives    levaquin   Oxycodone-Acetaminophen Hives and Other (See Comments)    ROS Review of Systems  Constitutional:  Negative for appetite change and unexpected weight change.  HENT:  Positive for trouble swallowing.   Respiratory:  Negative for cough and shortness of breath.   Cardiovascular:  Negative for chest pain.  Genitourinary:  Positive for testicular pain.  Negative for dysuria and hematuria.     Objective:    Physical Exam Vitals reviewed.  Constitutional:      Appearance: Normal appearance.  Cardiovascular:     Rate and Rhythm: Normal rate and regular rhythm.  Pulmonary:     Effort: Pulmonary effort is normal.     Breath sounds: Normal breath  sounds.  Musculoskeletal:     Right lower leg: No edema.     Left lower leg: No edema.  Neurological:     Mental Status: He is alert.    BP 100/60 (BP Location: Left Arm, Patient Position: Sitting, Cuff Size: Normal)   Pulse 60   Temp 97.9 F (36.6 C) (Oral)   Wt 153 lb 12.8 oz (69.8 kg)   SpO2 99%   BMI 24.82 kg/m  Wt Readings from Last 3 Encounters:  12/01/20 153 lb 12.8 oz (69.8 kg)  10/01/20 153 lb 3.2 oz (69.5 kg)  09/09/20 151 lb 12.8 oz (68.9 kg)     Health Maintenance Due  Topic Date Due   Pneumonia Vaccine 69+ Years old (2 - PPSV23 if available, else PCV20) 01/16/2018   COVID-19 Vaccine (4 - Booster for Pfizer series) 12/24/2019    There are no preventive care reminders to display for this patient.  Lab Results  Component Value Date   TSH 1.25 01/16/2017   Lab Results  Component Value Date   WBC 5.0 01/16/2017   HGB 14.8 01/16/2017   HCT 43.2 01/16/2017   MCV 93.7 01/16/2017   PLT 169.0 01/16/2017   Lab Results  Component Value Date   NA 138 01/02/2018   K 3.9 01/02/2018   CO2 31 01/02/2018   GLUCOSE 87 01/02/2018   BUN 12 01/02/2018   CREATININE 0.83 01/02/2018   BILITOT 1.0 07/13/2018   ALKPHOS 53 07/13/2018   AST 14 07/13/2018   ALT 12 07/13/2018   PROT 6.3 07/13/2018   ALBUMIN 4.3 07/13/2018   CALCIUM 9.0 01/02/2018   GFR 98.14 01/02/2018   Lab Results  Component Value Date   CHOL 133 07/13/2018   Lab Results  Component Value Date   HDL 40.20 07/13/2018   Lab Results  Component Value Date   LDLCALC 77 07/13/2018   Lab Results  Component Value Date   TRIG 78.0 07/13/2018   Lab Results  Component Value Date   CHOLHDL 3  07/13/2018   Lab Results  Component Value Date   HGBA1C 5.5 03/18/2014      Assessment & Plan:   #1 dysphagia to solid foods.  Progressive over 1 month.  He has not had any weight loss or appetite changes.  Already scheduled for barium swallow per GI.  He is encouraged to follow through with that.  In the meantime eat very slowly and chew food well and avoid hard to chew foods like steak  #2 chronic intermittent testicular pain.  He has had work-up previously which is unrevealing.  He is tried multiple things such as nonsteroidals without improvement.  This sounds possibly neuropathic.  We discussed possible trial of low-dose gabapentin 100 mg nightly and titrate after few nights if tolerating well to 200 mg.  He has scheduled follow-up in December and give some feedback at that time  Meds ordered this encounter  Medications   gabapentin (NEURONTIN) 100 MG capsule    Sig: Take one capsule at night for three days and then titrate up to two tablets at night if needed.    Dispense:  60 capsule    Refill:  3    Follow-up: No follow-ups on file.    Carolann Littler, MD

## 2020-12-04 ENCOUNTER — Telehealth: Payer: Self-pay | Admitting: Family Medicine

## 2020-12-04 ENCOUNTER — Telehealth: Payer: Self-pay

## 2020-12-04 NOTE — Telephone Encounter (Signed)
Pt call and stated the gabapentin is not working and want dr.Burchette to call him in something different he also stated he still have pain.

## 2020-12-04 NOTE — Telephone Encounter (Signed)
Spoke with the patient, he is aware of Dr. Erick Blinks message.

## 2020-12-04 NOTE — Telephone Encounter (Signed)
Patient called back stating that he is experiencing pain from his sciatic nerve and wants to know if he can be given something stronger for the pain

## 2020-12-07 ENCOUNTER — Ambulatory Visit
Admission: RE | Admit: 2020-12-07 | Discharge: 2020-12-07 | Disposition: A | Discharge status: Home / Self Care | Payer: Medicare HMO | Source: Ambulatory Visit | Attending: Physician Assistant | Admitting: Physician Assistant

## 2020-12-07 ENCOUNTER — Other Ambulatory Visit: Payer: Self-pay | Admitting: Physician Assistant

## 2020-12-07 DIAGNOSIS — R1310 Dysphagia, unspecified: Secondary | ICD-10-CM | POA: Diagnosis not present

## 2020-12-07 DIAGNOSIS — K449 Diaphragmatic hernia without obstruction or gangrene: Secondary | ICD-10-CM | POA: Diagnosis not present

## 2020-12-07 IMAGING — RF DG ESOPHAGUS
8 of 9 series · 14 of 24 positions shown · non-contrast
Comparison: None.

CLINICAL DATA: Dysphagia characterized by globus sensation in the
throat.

EXAM:
ESOPHOGRAM / BARIUM SWALLOW / BARIUM TABLET STUDY
TECHNIQUE: Combined double contrast and single contrast examination performed
using effervescent crystals, thick barium liquid, and thin barium
liquid. The patient was observed with fluoroscopy swallowing a 13 mm
barium sulphate tablet.
FLUOROSCOPY TIME:  Fluoroscopy Time:  3 minutes 36 seconds
Radiation Exposure Index (if provided by the fluoroscopic device):
21 mGy
Number of Acquired Spot Images: 5

[Series 1: sequence · 2 of 27 frames shown (1 of 7)]
[frame 5/27]
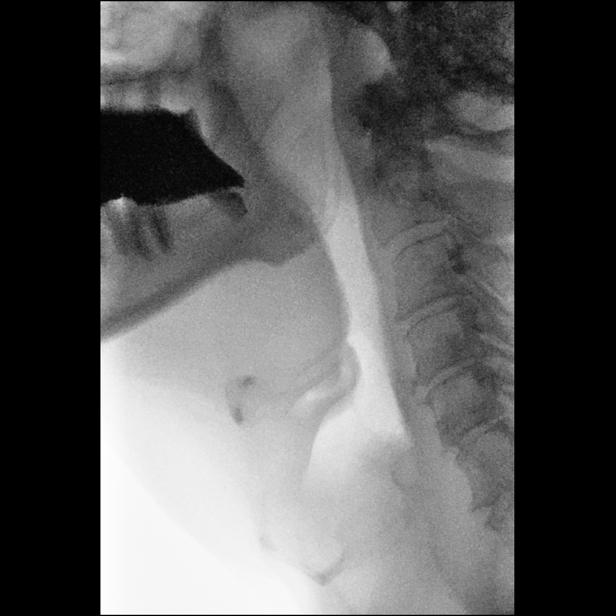
[frame 23/27]
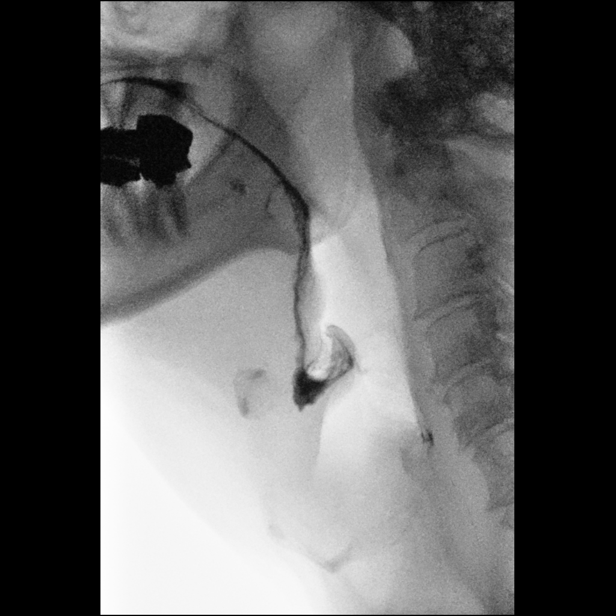

[Series 3: sequence · 1 of 27 frames shown (2 of 7)]
[frame 13/27]
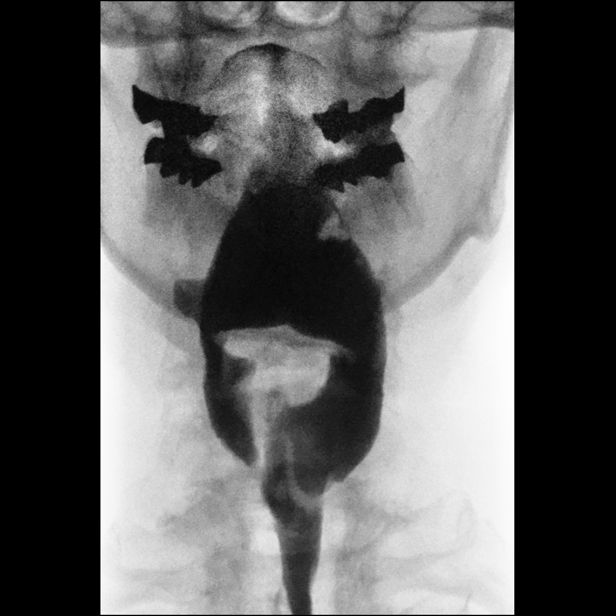

[Series 4: sequence · 2 of 71 frames shown (3 of 7)]
[frame 11/71]
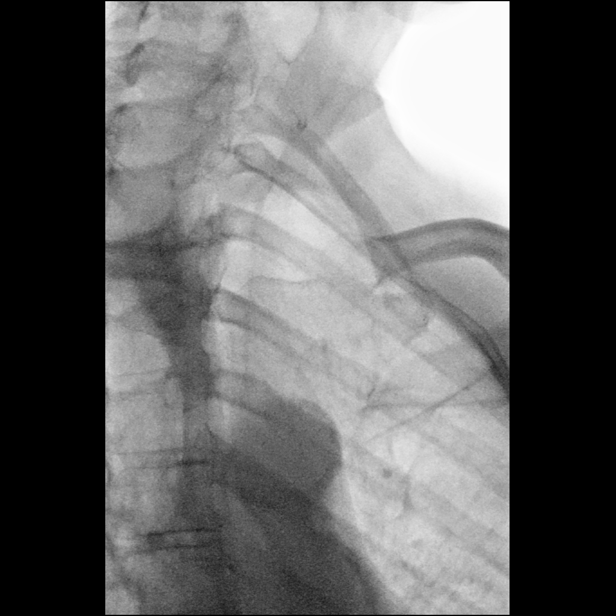
[frame 61/71]
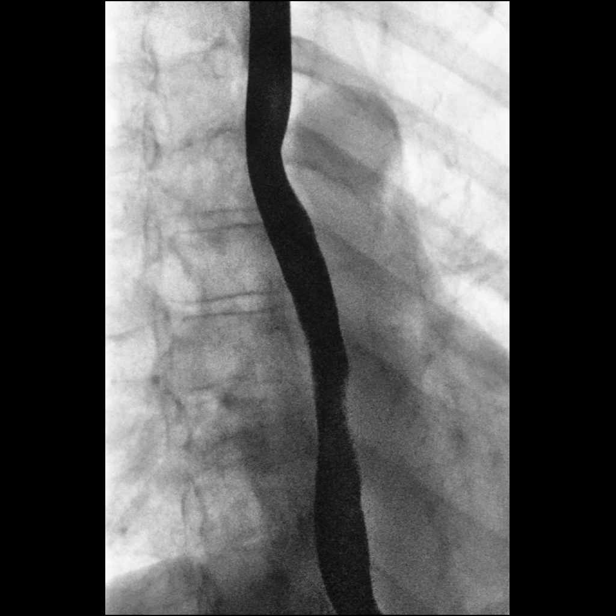

[Series 5: one shot · 0.15mm/px · 2 of 5 slices shown]
[im 2/5]
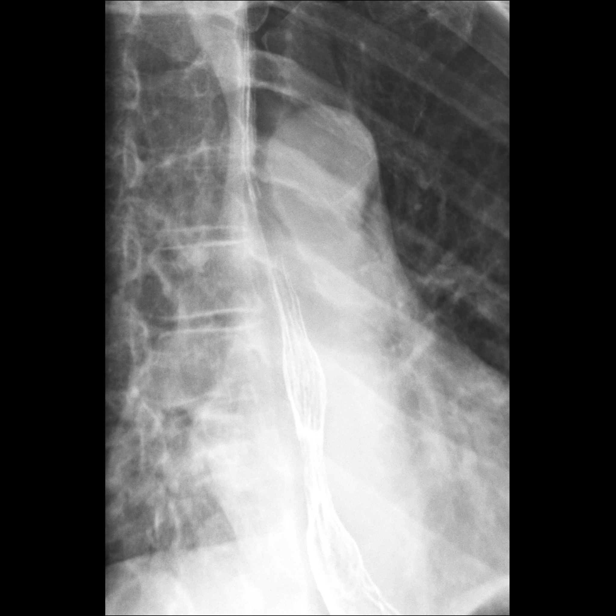
[im 5/5]
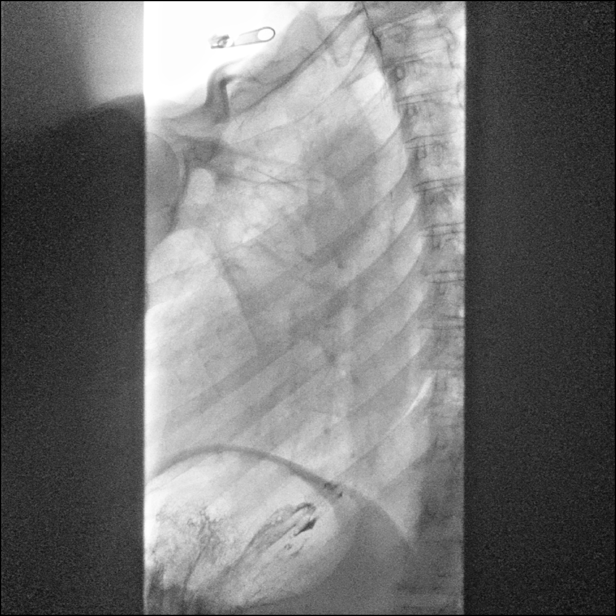

[Series 6: sequence · 2 of 47 frames shown (4 of 7)]
[frame 8/47]
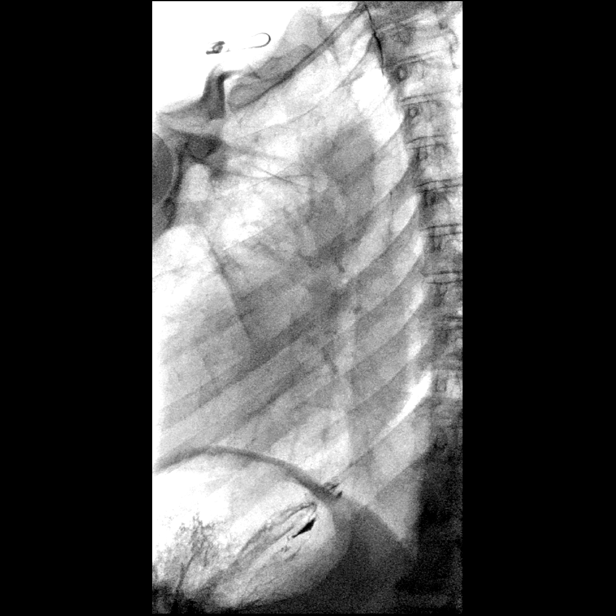
[frame 40/47]
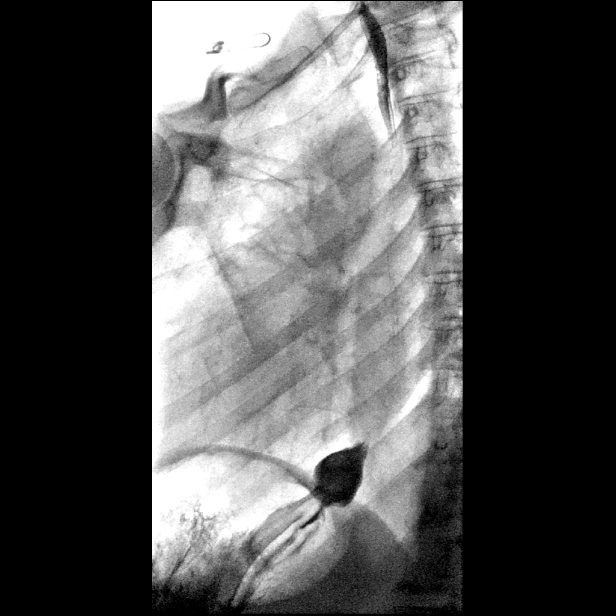

[Series 7: sequence · 1 of 150 frames shown (5 of 7)]
[frame 76/150]
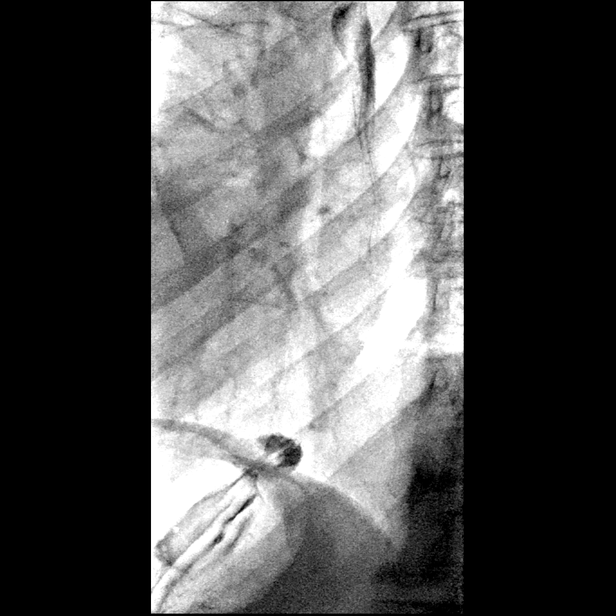

[Series 9: sequence · 2 of 10 frames shown (6 of 7)]
[frame 1/10]
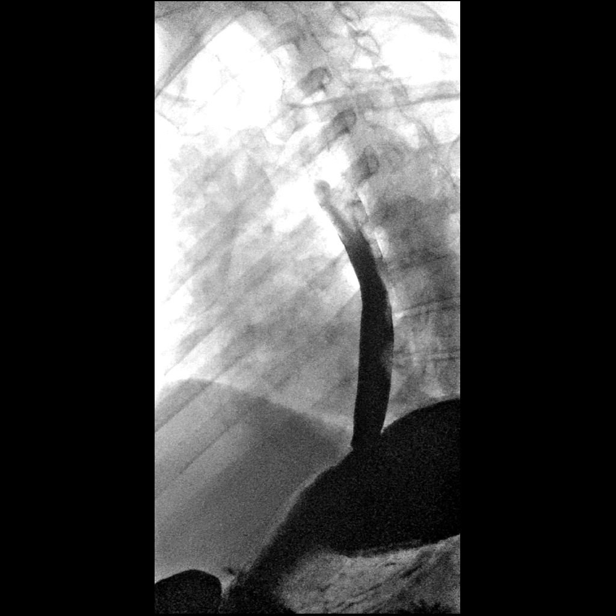
[frame 6/10]
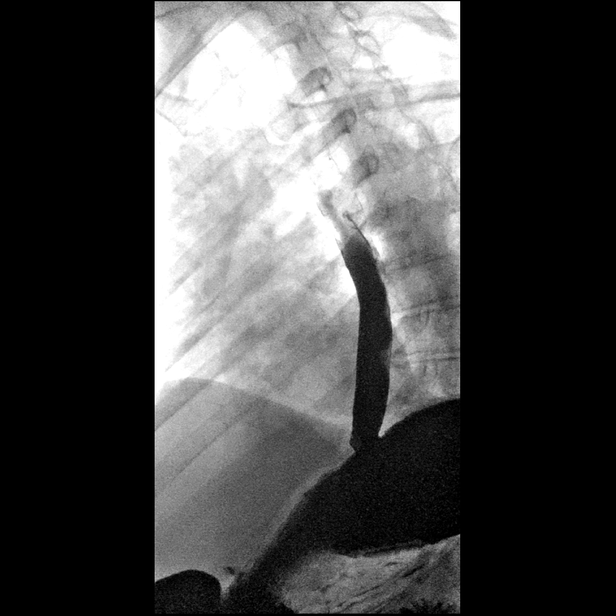

[Series 11: sequence · 2 of 54 frames shown (7 of 7)]
[frame 9/54]
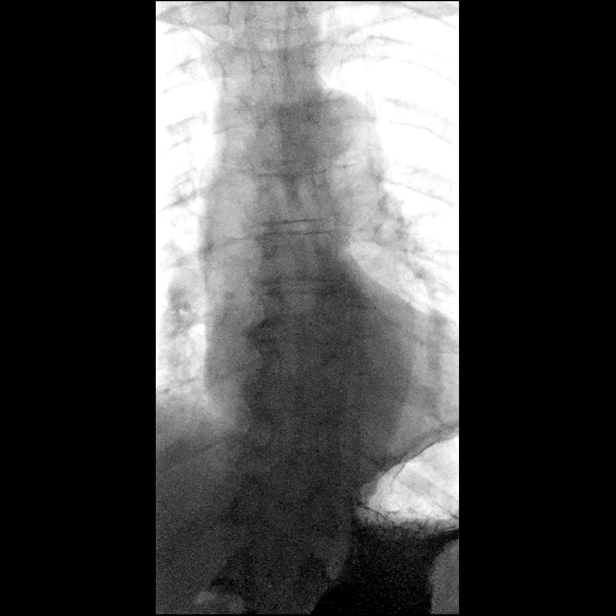
[frame 46/54]
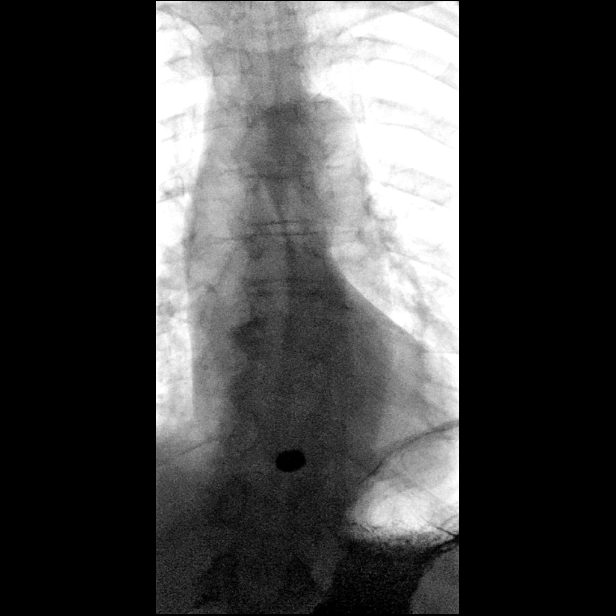

[14 of 24 positions shown; findings below may reference images not displayed]

FINDINGS: No laryngeal penetration or tracheobronchial aspiration. Mild barium
retention in the vallecula. No evidence of pharyngeal mass,
stricture or diverticulum. No significant cricopharyngeus muscle
dysfunction.

Small sliding hiatal hernia. Moderate to severe gastroesophageal
reflux elicited to the level of the upper thoracic esophagus with
water siphon test. Esophageal motility within normal limits.
Esophageal mucosa within normal limits. Normal esophageal
distensibility, with no evidence of esophageal mass, ulcer or
stricture. Barium tablet traversed the esophagus into the stomach
without delay.
IMPRESSION: 1. Small sliding hiatal hernia. Moderate to severe gastroesophageal
reflux elicited.
2. Normal esophageal motility.
3. No evidence of reflux esophagitis. No evidence of esophageal mass
or stricture.
4. Mild barium retention in the vallecula.

## 2020-12-22 DIAGNOSIS — K219 Gastro-esophageal reflux disease without esophagitis: Secondary | ICD-10-CM | POA: Diagnosis not present

## 2020-12-22 DIAGNOSIS — R1310 Dysphagia, unspecified: Secondary | ICD-10-CM | POA: Diagnosis not present

## 2020-12-23 ENCOUNTER — Other Ambulatory Visit: Payer: Self-pay | Admitting: Family Medicine

## 2020-12-29 ENCOUNTER — Encounter: Payer: Medicare HMO | Admitting: Family Medicine

## 2021-01-05 ENCOUNTER — Ambulatory Visit (INDEPENDENT_AMBULATORY_CARE_PROVIDER_SITE_OTHER): Payer: Medicare HMO | Admitting: Family Medicine

## 2021-01-05 ENCOUNTER — Encounter: Payer: Self-pay | Admitting: Family Medicine

## 2021-01-05 VITALS — BP 120/62 | HR 63 | Temp 98.0°F | Wt 151.5 lb

## 2021-01-05 DIAGNOSIS — Z23 Encounter for immunization: Secondary | ICD-10-CM

## 2021-01-05 DIAGNOSIS — Z0000 Encounter for general adult medical examination without abnormal findings: Secondary | ICD-10-CM | POA: Diagnosis not present

## 2021-01-05 NOTE — Progress Notes (Signed)
Established Patient Office Visit  Subjective:  Patient ID: Dennis Villarreal, male    DOB: 07/20/1950  Age: 70 y.o. MRN: [id number]  CC:  Chief Complaint  Patient presents with   Annual Exam    HPI [name] presents for physical exam.  He is recently had some issues with intermittent dysphagia.  He had barium swallow study which showed moderate to severe GERD but no evidence for stricture or mass.  He states his Protonix dosage was increased.  He has scheduled EGD.  Appetite stable.  He has history of melanoma.  He is followed by dermatology and in process of switching dermatologist because of insurance change.  He has history of BPH treated with tamsulosin.  Hyperlipidemia treated with Lipitor  Health maintenance reviewed:  -Needs Pneumovax.  He has had prior Prevnar 13.-COVID vaccines up-to-date -Has had shingles vaccine -Tetanus up-to-date -Prior hepatitis C screen negative. -Influenza screen already given -Colonoscopy due 2030  Family history-both parents deceased.  His mother had atrial fibrillation history of stroke.  His father had melanoma and glaucoma history.  Brother with glaucoma.  -Social history.  He is married.  He is retired but does some part-time work at the [employer] center.  Smoked briefly back in his 72s.  No regular alcohol use.  His wife had benign brain tumor this past year but had successful surgery and she has recovered well  Past Medical History:  Diagnosis Date   ALLERGIC RHINITIS 09/04/2009   Basal cell carcinoma    Cancer ([place]) 2012   melanoma   DYSLIPIDEMIA 09/04/2009   GERD 09/04/2009   High cholesterol    just above borderline per patient     Past Surgical History:  Procedure Laterality Date   GALLBLADDER SURGERY  01/2018   SHOULDER SURGERY  1986, 2009   left shoulder scromoplasty   TONSILLECTOMY     TRIGGER FINGER RELEASE Left     Family History  Problem Relation Age of Onset   Heart disease Mother        CVA and  cardiac arrhythmia, ? atrial fib   Stroke Mother    Hypertension Mother    Melanoma Father    Glaucoma Father    Glaucoma Brother     Social History   Socioeconomic History   Marital status: Married    Spouse name: Not on file   Number of children: 3   Years of education: College   Highest education level: Not on file  Occupational History   Occupation: TEFL teacher      Comment: Financial planner systems  Tobacco Use   Smoking status: Former    Packs/day: 1.00    Years: 20.00    Pack years: 20.00    Types: Cigarettes    Quit date: 01/24/1982    Years since quitting: 38.9   Smokeless tobacco: Never  Vaping Use   Vaping Use: Never used  Substance and Sexual Activity   Alcohol use: Yes    Alcohol/week: 0.0 standard drinks    Comment: socially   Drug use: No   Sexual activity: Not on file  Other Topics Concern   Not on file  Social History Narrative   Lives at home with wife and youngest daughter.   Has 3 children.    Caffeine: 2 cups/day    Social Determinants of Health   Financial Resource Strain: Low Risk    Difficulty of Paying Living Expenses: Not hard at all  Food Insecurity: No Food Insecurity  Worried About Charity fundraiser in the Last Year: Never true   [place] in the Last Year: Never true  Transportation Needs: No Transportation Needs   Lack of Transportation (Medical): No   Lack of Transportation (Non-Medical): No  Physical Activity: Inactive   Days of Exercise per Week: 0 days   Minutes of Exercise per Session: 0 min  Stress: No Stress Concern Present   Feeling of Stress : Not at all  Social Connections: Moderately Integrated   Frequency of Communication with Friends and Family: More than three times a week   Frequency of Social Gatherings with Friends and Family: More than three times a week   Attends Religious Services: Never   Marine scientist or Organizations: Yes   Attends Music therapist: 1 to 4 times per  year   Marital Status: Married  Human resources officer Violence: Not At Risk   Fear of Current or Ex-Partner: No   Emotionally Abused: No   Physically Abused: No   Sexually Abused: No    Outpatient Medications Prior to Visit  Medication Sig Dispense Refill   atorvastatin (LIPITOR) 20 MG tablet TAKE ONE TABLET BY MOUTH DAILY 90 tablet 3   gabapentin (NEURONTIN) 100 MG capsule Take one capsule at night for three days and then titrate up to two tablets at night if needed. 60 capsule 3   hydrocortisone 2.5 % cream 1 application     magnesium oxide (MAG-OX) 400 MG tablet Take 400 mg by mouth daily.     multivitamin (ONE-A-DAY MEN'S) TABS Take 1 tablet by mouth daily.     pantoprazole (PROTONIX) 40 MG tablet Take 1 tablet (40 mg total) by mouth daily. 90 tablet 2   psyllium (METAMUCIL) 58.6 % powder Take 1 packet by mouth daily.     tadalafil (CIALIS) 5 MG tablet TAKE ONE TABLET BY MOUTH DAILY 30 tablet 1   tamsulosin (FLOMAX) 0.4 MG CAPS capsule TAKE ONE CAPSULE BY MOUTH DAILY 90 capsule 1   ibuprofen (ADVIL) 800 MG tablet Take 1 tablet (800 mg total) by mouth every 8 (eight) hours as needed. with food.  Do not recommend prolonged use given age and high risk for GI problems such as ulcer 30 tablet 0   No facility-administered medications prior to visit.    Allergies  Allergen Reactions   Levofloxacin     REACTION: hives and GI upset   Naproxen Hives, Other (See Comments) and Swelling   Other Hives    levaquin   Oxycodone-Acetaminophen Hives and Other (See Comments)    ROS Review of Systems  Constitutional:  Negative for activity change, appetite change, fatigue, fever and unexpected weight change.  HENT:  Positive for trouble swallowing. Negative for congestion and ear pain.   Eyes:  Negative for pain and visual disturbance.  Respiratory:  Negative for cough, chest tightness, shortness of breath and wheezing.   Cardiovascular:  Negative for chest pain, palpitations and leg swelling.   Gastrointestinal:  Negative for abdominal distention, abdominal pain, blood in stool, constipation, diarrhea, nausea, rectal pain and vomiting.  Genitourinary:  Negative for dysuria, hematuria and testicular pain.  Musculoskeletal:  Negative for arthralgias and joint swelling.  Skin:  Negative for rash.  Neurological:  Negative for dizziness, syncope, weakness, light-headedness and headaches.  Hematological:  Negative for adenopathy.  Psychiatric/Behavioral:  Negative for confusion and dysphoric mood.      Objective:    Physical Exam Constitutional:      General:  He is not in acute distress.    Appearance: He is well-developed.  HENT:     Head: Normocephalic and atraumatic.     Right Ear: External ear normal.     Left Ear: External ear normal.  Eyes:     Conjunctiva/sclera: Conjunctivae normal.     Pupils: Pupils are equal, round, and reactive to light.  Neck:     Thyroid: No thyromegaly.  Cardiovascular:     Rate and Rhythm: Normal rate and regular rhythm.     Heart sounds: Normal heart sounds. No murmur heard. Pulmonary:     Effort: No respiratory distress.     Breath sounds: No wheezing or rales.  Abdominal:     General: Bowel sounds are normal. There is no distension.     Palpations: Abdomen is soft. There is no mass.     Tenderness: There is no abdominal tenderness. There is no guarding or rebound.  Musculoskeletal:     Cervical back: Normal range of motion and neck supple.     Right lower leg: No edema.     Left lower leg: No edema.  Lymphadenopathy:     Cervical: No cervical adenopathy.  Skin:    Findings: No rash.  Neurological:     Mental Status: He is alert and oriented to person, place, and time.     Cranial Nerves: No cranial nerve deficit.    BP 120/62 (BP Location: Left Arm, Patient Position: Sitting, Cuff Size: Normal)    Pulse 63    Temp 98 F (36.7 C) (Oral)    Wt 151 lb 8 oz (68.7 kg)    SpO2 100%    BMI 24.45 kg/m  Wt Readings from Last 3  Encounters:  01/05/21 151 lb 8 oz (68.7 kg)  12/01/20 153 lb 12.8 oz (69.8 kg)  10/01/20 153 lb 3.2 oz (69.5 kg)     There are no preventive care reminders to display for this patient.  There are no preventive care reminders to display for this patient.  Lab Results  Component Value Date   TSH 1.25 01/16/2017   Lab Results  Component Value Date   WBC 5.0 01/16/2017   HGB 14.8 01/16/2017   HCT 43.2 01/16/2017   MCV 93.7 01/16/2017   PLT 169.0 01/16/2017   Lab Results  Component Value Date   NA 138 01/02/2018   K 3.9 01/02/2018   CO2 31 01/02/2018   GLUCOSE 87 01/02/2018   BUN 12 01/02/2018   CREATININE 0.83 01/02/2018   BILITOT 1.0 07/13/2018   ALKPHOS 53 07/13/2018   AST 14 07/13/2018   ALT 12 07/13/2018   PROT 6.3 07/13/2018   ALBUMIN 4.3 07/13/2018   CALCIUM 9.0 01/02/2018   GFR 98.14 01/02/2018   Lab Results  Component Value Date   CHOL 133 07/13/2018   Lab Results  Component Value Date   HDL 40.20 07/13/2018   Lab Results  Component Value Date   LDLCALC 77 07/13/2018   Lab Results  Component Value Date   TRIG 78.0 07/13/2018   Lab Results  Component Value Date   CHOLHDL 3 07/13/2018   Lab Results  Component Value Date   HGBA1C 5.5 03/18/2014      Assessment & Plan:   Problem List Items Addressed This Visit   None Visit Diagnoses     Physical exam    -  Primary   Relevant Orders   Basic metabolic panel   Lipid panel   CBC with Differential/Platelet  TSH   Hepatic function panel   PSA   Need for pneumococcal vaccination       Relevant Orders   Pneumococcal polysaccharide vaccine 23-valent greater than or equal to 2yo subcutaneous/IM (Completed)     Patient has evidence for increased GERD from recent barium swallow.  Pending EGD.  Minimally symptomatic.  -Pneumovax given. -Other immunizations up-to-date. -Future lab order for labs as above -Continue annual flu vaccine -We will refer to a new dermatologist once he gets his  insurance into place  No orders of the defined types were placed in this encounter.   Follow-up: No follow-ups on file.    Carolann Littler, MD

## 2021-01-07 ENCOUNTER — Ambulatory Visit: Payer: Medicare HMO | Admitting: Family Medicine

## 2021-01-07 ENCOUNTER — Ambulatory Visit: Payer: Self-pay

## 2021-01-07 ENCOUNTER — Other Ambulatory Visit (INDEPENDENT_AMBULATORY_CARE_PROVIDER_SITE_OTHER): Payer: Medicare HMO

## 2021-01-07 ENCOUNTER — Telehealth: Payer: Self-pay

## 2021-01-07 ENCOUNTER — Other Ambulatory Visit: Payer: Self-pay

## 2021-01-07 ENCOUNTER — Encounter: Payer: Self-pay | Admitting: Family Medicine

## 2021-01-07 VITALS — BP 124/72 | HR 69 | Ht 66.0 in | Wt 155.6 lb

## 2021-01-07 DIAGNOSIS — G8929 Other chronic pain: Secondary | ICD-10-CM

## 2021-01-07 DIAGNOSIS — M25512 Pain in left shoulder: Secondary | ICD-10-CM

## 2021-01-07 DIAGNOSIS — Z0000 Encounter for general adult medical examination without abnormal findings: Secondary | ICD-10-CM | POA: Diagnosis not present

## 2021-01-07 LAB — BASIC METABOLIC PANEL
BUN: 13 mg/dL (ref 6–23)
CO2: 31 mEq/L (ref 19–32)
Calcium: 9.2 mg/dL (ref 8.4–10.5)
Chloride: 105 mEq/L (ref 96–112)
Creatinine, Ser: 0.8 mg/dL (ref 0.40–1.50)
GFR: 89.8 mL/min (ref >60.00)
Glucose, Bld: 94 mg/dL (ref 70–99)
Potassium: 4.3 mEq/L (ref 3.5–5.1)
Sodium: 142 mEq/L (ref 135–145)

## 2021-01-07 LAB — CBC WITH DIFFERENTIAL/PLATELET
Basophils Absolute: 0 10*3/uL (ref 0.0–0.1)
Basophils Relative: 0.2 % (ref 0.0–3.0)
Eosinophils Absolute: 0.2 10*3/uL (ref 0.0–0.7)
Eosinophils Relative: 2.5 % (ref 0.0–5.0)
HCT: 40.2 % (ref 39.0–52.0)
Hemoglobin: 13.7 g/dL (ref 13.0–17.0)
Lymphocytes Relative: 26.8 % (ref 12.0–46.0)
Lymphs Abs: 1.6 10*3/uL (ref 0.7–4.0)
MCHC: 34.2 g/dL (ref 30.0–36.0)
MCV: 95.7 fl (ref 78.0–100.0)
Monocytes Absolute: 0.4 10*3/uL (ref 0.1–1.0)
Monocytes Relative: 7.3 % (ref 3.0–12.0)
Neutro Abs: 3.9 10*3/uL (ref 1.4–7.7)
Neutrophils Relative %: 63.2 % (ref 43.0–77.0)
Platelets: 147 10*3/uL — ABNORMAL LOW (ref 150.0–400.0)
RBC: 4.2 Mil/uL — ABNORMAL LOW (ref 4.22–5.81)
RDW: 12.6 % (ref 11.5–15.5)
WBC: 6.1 10*3/uL (ref 4.0–10.5)

## 2021-01-07 LAB — HEPATIC FUNCTION PANEL
ALT: 12 U/L (ref 0–53)
AST: 15 U/L (ref 0–37)
Albumin: 4.2 g/dL (ref 3.5–5.2)
Alkaline Phosphatase: 57 U/L (ref 39–117)
Bilirubin, Direct: 0.2 mg/dL (ref 0.0–0.3)
Total Bilirubin: 1.1 mg/dL (ref 0.2–1.2)
Total Protein: 6.6 g/dL (ref 6.0–8.3)

## 2021-01-07 LAB — LIPID PANEL
Cholesterol: 131 mg/dL (ref 0–200)
HDL: 42.9 mg/dL (ref >39.00)
LDL Cholesterol: 72 mg/dL (ref 0–99)
NonHDL: 88.5
Total CHOL/HDL Ratio: 3
Triglycerides: 82 mg/dL (ref 0.0–149.0)
VLDL: 16.4 mg/dL (ref 0.0–40.0)

## 2021-01-07 LAB — PSA: PSA: 1.84 ng/mL (ref 0.10–4.00)

## 2021-01-07 LAB — TSH: TSH: 1.91 u[IU]/mL (ref 0.35–5.50)

## 2021-01-07 MED ORDER — HYDROCODONE-ACETAMINOPHEN 5-325 MG PO TABS: 1.0000 | ORAL_TABLET | Freq: Four times a day (QID) | ORAL | 0 refills | Status: DC | PRN

## 2021-01-07 NOTE — Telephone Encounter (Signed)
Patient called stating that he forgot to ask for any type of medication he could take for his shoulder pain. Patient states that he has been on hydrocodone before. Would like a call back to discuss options or if hydrocodone is sent in would like it to be sent to [id number]

## 2021-01-07 NOTE — Telephone Encounter (Signed)
Hydrocodone sent

## 2021-01-07 NOTE — Progress Notes (Signed)
I, Dennis Villarreal, LAT, ATC, am serving as scribe for Dr. [name].  Dennis Villarreal is a 70 y.o. male who presents to [place] at [facility] today for f/u of chronic L shoulder pain due to RC tendinosis and subacromial bursitis.  He was last seen by Dr. Georgina Villarreal on 10/01/20 to review his L shoulder MRI and had a L subacromial steroid injection.  He's had a prior course of PT and was advised to con't his HEP.  Today, pt reports that his L shoulder pain flared up over the last week.  He states that he may have strained his L shoulder at work while pushing/pulling something w/ L arm abducted.  He locates the pain to his L ant shoulder.  He states that he con't to do some of his HEP but isn't doing them as prescribed.  Diagnostic testing: L shoulder MRI- 09/29/20; L shoulder XR- 05/20/20  Pertinent review of systems: No fevers or chills  Relevant historical information: Hyperlipidemia Prior history left shoulder surgery.   Exam:  BP 124/72 (BP Location: Left Arm, Patient Position: Sitting, Cuff Size: Normal)    Pulse 69    Ht 5\' 6"  (1.676 m)    Wt 155 lb 9.6 oz (70.6 kg)    SpO2 96%    BMI 25.11 kg/m  General: Well Developed, well nourished, and in no acute distress.   MSK: Left shoulder mature scar superior shoulder. Normal motion pain with abduction. Intact strength.  Positive impingement testing.    Lab and Radiology Results  Procedure: Real-time Ultrasound Guided Injection of shoulder subacromial bursa Device: Philips Affiniti 50G Images permanently stored and available for review in PACS Ultrasound evaluation prior to injection reveals intact supraspinatus tendon. Verbal informed consent obtained.  Discussed risks and benefits of procedure. Warned about infection bleeding damage to structures skin hypopigmentation and fat atrophy among others. Patient expresses understanding and agreement Time-out conducted.   Noted no overlying erythema, induration, or other  signs of local infection.   Skin prepped in a sterile fashion.   Local anesthesia: Topical Ethyl chloride.   With sterile technique and under real time ultrasound guidance: 40 mg of Kenalog and 2 mL of Marcaine injected into subacromial bursa. Fluid seen entering the bursa.   Completed without difficulty   Pain immediately resolved suggesting accurate placement of the medication.   Advised to call if fevers/chills, erythema, induration, drainage, or persistent bleeding.   Images permanently stored and available for review in the ultrasound unit.  Impression: Technically successful ultrasound guided injection.    EXAM: MRI OF THE LEFT SHOULDER WITHOUT CONTRAST   TECHNIQUE: Multiplanar, multisequence MR imaging of the shoulder was performed. No intravenous contrast was administered.   COMPARISON:  X-ray 05/20/2020   FINDINGS: Rotator cuff: Moderate supraspinatus tendinosis with partial-thickness bursal and articular sided fraying of the distal tendon anteriorly (series 6, image 12). No full-thickness tear. Mild infraspinatus tendinosis. Subscapularis and teres minor tendons intact.   Muscles: Preserved bulk and signal intensity of the rotator cuff musculature without edema, atrophy, or fatty infiltration.   Biceps long head:  Intact.   Acromioclavicular Joint: Moderate arthropathy of the AC joint. Small volume subacromial-subdeltoid bursal fluid.   Glenohumeral Joint: No joint effusion. No chondral defect.   Labrum: Grossly intact, but evaluation is limited by lack of intraarticular fluid.   Bones:  No marrow abnormality, fracture or dislocation.   Other: None.   IMPRESSION: 1. Moderate supraspinatus tendinosis with partial-thickness bursal and articular sided fraying  of the distal tendon anteriorly. No full-thickness tear. 2. Mild infraspinatus tendinosis. 3. Mild subacromial-subdeltoid bursitis. 4. Moderate AC joint arthropathy.     Electronically Signed   By:  Davina Poke D.O.   On: 09/29/2020 15:27   I, [name], personally (independently) visualized and performed the interpretation of the images attached in this note.      Assessment and Plan: 70 y.o. male with left shoulder pain.  Patient has rotator cuff tendinopathy with partial tear.  He did great from September until just recently with subacromial injection and home exercise program.  Previously he did have physical therapy which helped quite a bit as well.  Plan for repeat steroid injection and continued home exercise program.  If this intervention is not successful enough recommend surgical consultation.  Recheck as needed.   PDMP not reviewed this encounter. Orders Placed This Encounter  Procedures   [place] LIMITED JOINT SPACE STRUCTURES UP LEFT(NO LINKED CHARGES)    Order Specific Question:   Reason for Exam (SYMPTOM  OR DIAGNOSIS REQUIRED)    Answer:   L shoulder pain    Order Specific Question:   Preferred imaging location?    Answer:   [id number]   No orders of the defined types were placed in this encounter.    Discussed warning signs or symptoms. Please see discharge instructions. Patient expresses understanding.   The above documentation has been reviewed and is accurate and complete [name], M.D.

## 2021-01-07 NOTE — Telephone Encounter (Signed)
Called pt and left detailed message on VM letting him know that hydrocodone was sent to his preferred pharmacy.

## 2021-01-07 NOTE — Patient Instructions (Signed)
Good to see you today.  You had a L shoulder injection.  Call or go to the ER if you develop a large red swollen joint with extreme pain or oozing puss.   Con't your home exercises.  Follow-up: as needed

## 2021-01-07 NOTE — Addendum Note (Signed)
Addended by: Gregor Hams on: 01/07/2021 11:52 AM   Modules accepted: Orders

## 2021-01-13 DIAGNOSIS — K2930 Chronic superficial gastritis without bleeding: Secondary | ICD-10-CM | POA: Diagnosis not present

## 2021-01-13 DIAGNOSIS — K219 Gastro-esophageal reflux disease without esophagitis: Secondary | ICD-10-CM | POA: Diagnosis not present

## 2021-01-13 DIAGNOSIS — K449 Diaphragmatic hernia without obstruction or gangrene: Secondary | ICD-10-CM | POA: Diagnosis not present

## 2021-01-13 DIAGNOSIS — R1310 Dysphagia, unspecified: Secondary | ICD-10-CM | POA: Diagnosis not present

## 2021-01-16 ENCOUNTER — Other Ambulatory Visit: Payer: Self-pay | Admitting: Family Medicine

## 2021-01-21 DIAGNOSIS — K219 Gastro-esophageal reflux disease without esophagitis: Secondary | ICD-10-CM | POA: Diagnosis not present

## 2021-01-21 DIAGNOSIS — K2930 Chronic superficial gastritis without bleeding: Secondary | ICD-10-CM | POA: Diagnosis not present

## 2021-01-27 DIAGNOSIS — H43813 Vitreous degeneration, bilateral: Secondary | ICD-10-CM | POA: Diagnosis not present

## 2021-01-27 DIAGNOSIS — H52223 Regular astigmatism, bilateral: Secondary | ICD-10-CM | POA: Diagnosis not present

## 2021-01-27 DIAGNOSIS — H2513 Age-related nuclear cataract, bilateral: Secondary | ICD-10-CM | POA: Diagnosis not present

## 2021-01-27 DIAGNOSIS — H5213 Myopia, bilateral: Secondary | ICD-10-CM | POA: Diagnosis not present

## 2021-01-27 DIAGNOSIS — D3132 Benign neoplasm of left choroid: Secondary | ICD-10-CM | POA: Diagnosis not present

## 2021-02-26 ENCOUNTER — Ambulatory Visit (INDEPENDENT_AMBULATORY_CARE_PROVIDER_SITE_OTHER): Payer: No Typology Code available for payment source | Admitting: Family Medicine

## 2021-02-26 VITALS — BP 130/70 | HR 75 | Temp 98.1°F | Wt 157.2 lb

## 2021-02-26 DIAGNOSIS — R140 Abdominal distension (gaseous): Secondary | ICD-10-CM

## 2021-02-26 DIAGNOSIS — R1084 Generalized abdominal pain: Secondary | ICD-10-CM

## 2021-02-26 DIAGNOSIS — K5900 Constipation, unspecified: Secondary | ICD-10-CM | POA: Diagnosis not present

## 2021-02-26 NOTE — Patient Instructions (Signed)
Consider OTC probiotic such as Activia  Consider Miralax as needed for constipation  Continue with fiber supplement

## 2021-02-26 NOTE — Progress Notes (Signed)
Established Patient Office Visit  Subjective:  Patient ID: Dennis Villarreal, male    DOB: 08-05-1950  Age: 71 y.o. MRN: [id number]  CC:  Chief Complaint  Patient presents with   Abdominal Pain    HPI [name] presents for abdominal complaints.  He relates frequent bloating and diffuse pressure and discomfort.  Possibly worse after eating.  Sometimes seems to be lessened after urinating.  Denies any dysuria.  Denies any fevers or chills.  No nausea or vomiting.  Appetite stable.  Weight is actually up 6 pounds compared to December.  Physical in December with several labs with no acute abnormalities.  Last colonoscopy 2020 reviewed.  He had several polyps which were benign.  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  He no longer takes that.  He does occasionally have straining.  He takes fiber supplement with Metamucil most days.  May not be getting enough fluid intake to balance that.  He has had previous cholecystectomy.  Past Medical History:  Diagnosis Date   ALLERGIC RHINITIS 09/04/2009   Basal cell carcinoma    Cancer ([place]) 2012   melanoma   DYSLIPIDEMIA 09/04/2009   GERD 09/04/2009   High cholesterol    just above borderline per patient     Past Surgical History:  Procedure Laterality Date   GALLBLADDER SURGERY  01/2018   SHOULDER SURGERY  1986, 2009   left shoulder scromoplasty   TONSILLECTOMY     TRIGGER FINGER RELEASE Left     Family History  Problem Relation Age of Onset   Heart disease Mother        CVA and cardiac arrhythmia, ? atrial fib   Stroke Mother    Hypertension Mother    Melanoma Father    Glaucoma Father    Glaucoma Brother     Social History   Socioeconomic History   Marital status: Married    Spouse name: Not on file   Number of children: 3   Years of education: College   Highest education level: Not on file  Occupational History   Occupation: TEFL teacher       Comment: Financial planner systems  Tobacco Use   Smoking status: Former    Packs/day: 1.00    Years: 20.00    Pack years: 20.00    Types: Cigarettes    Quit date: 01/24/1982    Years since quitting: 39.1   Smokeless tobacco: Never  Vaping Use   Vaping Use: Never used  Substance and Sexual Activity   Alcohol use: Yes    Alcohol/week: 0.0 standard drinks    Comment: socially   Drug use: No   Sexual activity: Not on file  Other Topics Concern   Not on file  Social History Narrative   Lives at home with wife and youngest daughter.   Has 3 children.    Caffeine: 2 cups/day    Social Determinants of Health   Financial Resource Strain: Low Risk    Difficulty of Paying Living Expenses: Not hard at all  Food Insecurity: No Food Insecurity   Worried About Charity fundraiser in the Last Year: Never true   Arboriculturist in the Last Year: Never true  Transportation Needs: No Transportation Needs   Lack of Transportation (Medical): No   Lack of Transportation (Non-Medical): No  Physical Activity: Inactive   Days of Exercise per Week: 0 days   Minutes of Exercise  per Session: 0 min  Stress: No Stress Concern Present   Feeling of Stress : Not at all  Social Connections: Moderately Integrated   Frequency of Communication with Friends and Family: More than three times a week   Frequency of Social Gatherings with Friends and Family: More than three times a week   Attends Religious Services: Never   Marine scientist or Organizations: Yes   Attends Music therapist: 1 to 4 times per year   Marital Status: Married  Human resources officer Violence: Not At Risk   Fear of Current or Ex-Partner: No   Emotionally Abused: No   Physically Abused: No   Sexually Abused: No    Outpatient Medications Prior to Visit  Medication Sig Dispense Refill   atorvastatin (LIPITOR) 20 MG tablet TAKE ONE TABLET BY MOUTH DAILY 90 tablet 3   gabapentin (NEURONTIN)  100 MG capsule Take one capsule at night for three days and then titrate up to two tablets at night if needed. 60 capsule 3   HYDROcodone-acetaminophen (NORCO/VICODIN) 5-325 MG tablet Take 1 tablet by mouth every 6 (six) hours as needed. 7 tablet 0   hydrocortisone 2.5 % cream 1 application     magnesium oxide (MAG-OX) 400 MG tablet Take 400 mg by mouth daily.     multivitamin (ONE-A-DAY MEN'S) TABS Take 1 tablet by mouth daily.     pantoprazole (PROTONIX) 40 MG tablet Take 1 tablet (40 mg total) by mouth daily. 90 tablet 2   psyllium (METAMUCIL) 58.6 % powder Take 1 packet by mouth daily.     tadalafil (CIALIS) 5 MG tablet TAKE ONE TABLET BY MOUTH DAILY 30 tablet 1   tamsulosin (FLOMAX) 0.4 MG CAPS capsule TAKE ONE CAPSULE BY MOUTH DAILY 90 capsule 1   No facility-administered medications prior to visit.    Allergies  Allergen Reactions   Levofloxacin     REACTION: hives and GI upset   Naproxen Hives, Other (See Comments) and Swelling   Other Hives    levaquin   Oxycodone-Acetaminophen Hives and Other (See Comments)    ROS Review of Systems  Constitutional:  Negative for appetite change, chills, fever and unexpected weight change.  Respiratory:  Negative for cough.   Cardiovascular:  Negative for chest pain.  Gastrointestinal:  Positive for abdominal pain and constipation. Negative for blood in stool, diarrhea, nausea and vomiting.  Genitourinary:  Negative for dysuria.     Objective:    Physical Exam Vitals reviewed.  Constitutional:      Appearance: He is well-developed.  Cardiovascular:     Rate and Rhythm: Normal rate and regular rhythm.  Pulmonary:     Effort: Pulmonary effort is normal.     Breath sounds: Normal breath sounds.  Abdominal:     Comments: Abdomen nondistended.  Normal bowel sounds.  Soft nontender with no masses palpated.  No guarding or rebound.  Neurological:     Mental Status: He is alert.    BP 130/70 (BP Location: Left Arm,  Patient Position: Sitting, Cuff Size: Normal)    Pulse 75    Temp 98.1 F (36.7 C) (Oral)    Wt 157 lb 3.2 oz (71.3 kg)    SpO2 96%    BMI 25.37 kg/m  Wt Readings from Last 3 Encounters:  02/26/21 157 lb 3.2 oz (71.3 kg)  01/07/21 155 lb 9.6 oz (70.6 kg)  01/05/21 151 lb 8 oz (68.7 kg)     There are no preventive care reminders  to display for this patient.  There are no preventive care reminders to display for this patient.  Lab Results  Component Value Date   TSH 1.91 01/07/2021   Lab Results  Component Value Date   WBC 6.1 01/07/2021   HGB 13.7 01/07/2021   HCT 40.2 01/07/2021   MCV 95.7 01/07/2021   PLT 147.0 (L) 01/07/2021   Lab Results  Component Value Date   NA 142 01/07/2021   K 4.3 01/07/2021   CO2 31 01/07/2021   GLUCOSE 94 01/07/2021   BUN 13 01/07/2021   CREATININE 0.80 01/07/2021   BILITOT 1.1 01/07/2021   ALKPHOS 57 01/07/2021   AST 15 01/07/2021   ALT 12 01/07/2021   PROT 6.6 01/07/2021   ALBUMIN 4.2 01/07/2021   CALCIUM 9.2 01/07/2021   GFR 89.80 01/07/2021   Lab Results  Component Value Date   CHOL 131 01/07/2021   Lab Results  Component Value Date   HDL 42.90 01/07/2021   Lab Results  Component Value Date   LDLCALC 72 01/07/2021   Lab Results  Component Value Date   TRIG 82.0 01/07/2021   Lab Results  Component Value Date   CHOLHDL 3 01/07/2021   Lab Results  Component Value Date   HGBA1C 5.5 03/18/2014      Assessment & Plan:   Presents with intermittent abdominal bloating, constipation, poorly localized pain intermittently.  Suspect gaseous pain.  He does appear to have some constipation issues as well.  No signs of obstruction based on exam.  He has benign exam at this time.  Does not have any worrisome red flags such as weight loss or significant change of appetite, bloody stool, etc.  Recent colonoscopy as above less than 3 years ago  -Recommend low FODMAP diet with handout given -Consider MiraLAX as needed for  constipation symptoms.  Avoid stimulant laxatives. -Consider over-the-counter probiotics such as Activia -Be in touch if symptoms not improved with the above -30 minutes were spent with patient assessing his past history, reviewing previous studies, assessing current symptoms, discussing treatment plan, and documentation   No orders of the defined types were placed in this encounter.   Follow-up: No follow-ups on file.    Carolann Littler, MD

## 2021-03-09 ENCOUNTER — Ambulatory Visit (INDEPENDENT_AMBULATORY_CARE_PROVIDER_SITE_OTHER): Payer: No Typology Code available for payment source | Admitting: Family Medicine

## 2021-03-09 ENCOUNTER — Encounter: Payer: Self-pay | Admitting: Family Medicine

## 2021-03-09 ENCOUNTER — Other Ambulatory Visit: Payer: Self-pay

## 2021-03-09 VITALS — BP 138/78 | HR 57 | Ht 66.0 in | Wt 154.2 lb

## 2021-03-09 DIAGNOSIS — L814 Other melanin hyperpigmentation: Secondary | ICD-10-CM | POA: Diagnosis not present

## 2021-03-09 DIAGNOSIS — M25512 Pain in left shoulder: Secondary | ICD-10-CM

## 2021-03-09 DIAGNOSIS — G8929 Other chronic pain: Secondary | ICD-10-CM

## 2021-03-09 DIAGNOSIS — L821 Other seborrheic keratosis: Secondary | ICD-10-CM | POA: Diagnosis not present

## 2021-03-09 MED ORDER — IBUPROFEN 800 MG PO TABS: 800.0000 mg | ORAL_TABLET | Freq: Three times a day (TID) | ORAL | 0 refills | Status: DC | PRN

## 2021-03-09 NOTE — Progress Notes (Signed)
I, Dennis Villarreal, LAT, ATC, am serving as scribe for Dr. [name].  Dennis Villarreal is a 71 y.o. male who presents to [place] at [facility] today for worsening L shoulder pain due to [facility] tendinopathy with partial tear. Pt was last seen by [name] on 01/07/21 and a L subacromial steroid injection was repeated and pt was advised to cont HEP. Today, pt reports worsening L shoulder pain over the last 3 weeks.  He's been trying to avoid using it w/ lifting at work.  He has been doing his HEP fairly consistently.  He will be travelling to his daughter's destination wedding in [place] and will be leaving March 7th.  He has finished all of his oxycodone that was previously prescribed in December but notes that did help control his pain.  He will get back from [place] on March 14th.  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  Dx imaging: 09/29/20 L shoulder MRI  05/20/20 L shoulder XR  Pertinent review of systems: No fevers or chills  Relevant historical information: BPH. Can take ibuprofen.  It will eventually cause some stomach irritation but can take up to 800 mg of ibuprofen.   Exam:  BP 138/78 (BP Location: Left Arm, Patient Position: Sitting, Cuff Size: Normal)    Pulse (!) 57    Ht 5\' 6"  (1.676 m)    Wt 154 lb 3.2 oz (69.9 kg)    SpO2 98%    BMI 24.89 kg/m  General: Well Developed, well nourished, and in no acute distress.   MSK: Left shoulder normal-appearing Range of motion abduction 130 degrees.  Internal rotation lumbar spine.  External rotation full. Strength reduced abduction.    Lab and Radiology Results EXAM: MRI OF THE LEFT SHOULDER WITHOUT CONTRAST   TECHNIQUE: Multiplanar, multisequence MR imaging of the shoulder was performed. No intravenous contrast was administered.   COMPARISON:  X-ray 05/20/2020   FINDINGS: Rotator cuff: Moderate supraspinatus tendinosis with partial-thickness bursal and articular sided  fraying of the distal tendon anteriorly (series 6, image 12). No full-thickness tear. Mild infraspinatus tendinosis. Subscapularis and teres minor tendons intact.   Muscles: Preserved bulk and signal intensity of the rotator cuff musculature without edema, atrophy, or fatty infiltration.   Biceps long head:  Intact.   Acromioclavicular Joint: Moderate arthropathy of the AC joint. Small volume subacromial-subdeltoid bursal fluid.   Glenohumeral Joint: No joint effusion. No chondral defect.   Labrum: Grossly intact, but evaluation is limited by lack of intraarticular fluid.   Bones:  No marrow abnormality, fracture or dislocation.   Other: None.   IMPRESSION: 1. Moderate supraspinatus tendinosis with partial-thickness bursal and articular sided fraying of the distal tendon anteriorly. No full-thickness tear. 2. Mild infraspinatus tendinosis. 3. Mild subacromial-subdeltoid bursitis. 4. Moderate AC joint arthropathy.     Electronically Signed   By: Davina Poke D.O.   On: 09/29/2020 15:27   I, [name], personally (independently) visualized and performed the interpretation of the images attached in this note.     Assessment and Plan: 71 y.o. male with left shoulder pain due to rotator cuff partial tear and tendinopathy.  [name] has tried very hard to get better with conservative management including trials of home exercise program, physical therapy, and subsequent subacromial injections x2.  Most recent subacromial injection did not work as well as the previous 1 and his pain has returned.  At this point with the return of his pain and the  appearance of his rotator cuff on MRI from September I am not optimistic about further conservative management.  I think his best bet is for surgical consultation.  Recommend surgical consultation now and likely scheduling surgery after his daughter's wedding in March.  He thinks this is a good idea.  Discussed pain management.   Recommend Tylenol as first-line medication.  He has been taking some old leftover ibuprofen 800s which seems to work pretty well.  I refilled them and recommended using them with caution.  Recheck as needed.   PDMP not reviewed this encounter. Orders Placed This Encounter  Procedures   Ambulatory referral to Orthopedic Surgery    Referral Priority:   Routine    Referral Type:   Surgical    Referral Reason:   Specialty Services Required    Requested Specialty:   Orthopedic Surgery    Number of Visits Requested:   1   Meds ordered this encounter  Medications   ibuprofen (ADVIL) 800 MG tablet    Sig: Take 1 tablet (800 mg total) by mouth every 8 (eight) hours as needed.    Dispense:  90 tablet    Refill:  0     Discussed warning signs or symptoms. Please see discharge instructions. Patient expresses understanding.   The above documentation has been reviewed and is accurate and complete [name], M.D.

## 2021-03-09 NOTE — Patient Instructions (Addendum)
Good to see you today.  I've referred you to [facility] for a surgical consultation regarding your L shoulder.  Their office will call you to schedule but please let us know if you haven't heard from them by Friday regarding scheduling.  I've prescribed you more Ibuprofen 800.  Follow-up as needed.  Enjoy your daughter's wedding in [place].

## 2021-03-15 ENCOUNTER — Ambulatory Visit (INDEPENDENT_AMBULATORY_CARE_PROVIDER_SITE_OTHER): Payer: No Typology Code available for payment source | Admitting: Orthopedic Surgery

## 2021-03-15 ENCOUNTER — Other Ambulatory Visit: Payer: Self-pay

## 2021-03-15 ENCOUNTER — Encounter: Payer: Self-pay | Admitting: Orthopedic Surgery

## 2021-03-15 DIAGNOSIS — M792 Neuralgia and neuritis, unspecified: Secondary | ICD-10-CM

## 2021-03-15 NOTE — Progress Notes (Signed)
Office Visit Note   Patient: Dennis Villarreal           Date of Birth: [birth year]           MRN: [id number] Visit Date: 03/15/2021 Requested by: Gregor Hams, MD [address],  [place] [address] PCP: [name], MD  Subjective: Chief Complaint  Patient presents with   Left Shoulder - Pain    HPI: Mackenzie is a 71 year old patient with left shoulder pain.  Old notes are reviewed.  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  He reports pain in that left shoulder region which has been going on severely for 3 weeks but less severely for several months.  He has had 2 prior surgeries on the left shoulder.  Sounds like he had arthroscopic decompression in 2004 which did help.  Had a second surgery which sounded similar which did not help.  He has had physical therapy as well as 2 injections.  First injection helped him for several months.  Second injection helped him slightly less but only for 1 week.  Symptoms are not better when he puts his arm overhead.  Over the past 3 weeks he has had symptoms involving the arm below the elbow and radiating into the hand without numbness and tingling.  Pain does not wake him from sleep at night.  He takes ibuprofen for his symptoms.  He is leaving on 03/30/2021 for destination wedding and will be back in mid March.              ROS: All systems reviewed are negative as they relate to the chief complaint within the history of present illness.  Patient denies  fevers or chills.   Assessment & Plan: Visit Diagnoses:  1. Radicular pain in left arm     Plan: Impression is left shoulder rotator cuff tear.  MRI is reviewed with the patient which does show about 95% tearing of the supraspinatus without retraction.  Evidence of prior acromioplasty noted.  Not too much in terms of glenohumeral joint arthritis or AC joint arthritis.  Plan is after failure of conservative management arthroscopic evaluation indicated  with biceps tenodesis and rotator cuff repair.  In addition patient does have C7-T1 spondylolisthesis and is having some symptoms currently which could also be part of his constellation of pain generators.  Since we have some time prior to the wedding I think MRI C-spine indicated to evaluate possible early left-sided radiculopathy.  We will post him for left shoulder rotator cuff repair after he returns from his destination wedding.  Requested injection today but we will hold off on that due to slight increase in infection risk close to the time of surgery.  Follow-Up Instructions: Return for after MRI.   Orders:  Orders Placed This Encounter  Procedures   MR Cervical Spine w/o contrast   No orders of the defined types were placed in this encounter.     Procedures: No procedures performed   Clinical Data: No additional findings.  Objective: Vital Signs: There were no vitals taken for this visit.  Physical Exam:   Constitutional: Patient appears well-developed HEENT:  Head: Normocephalic Eyes:EOM are normal Neck: Normal range of motion Cardiovascular: Normal rate Pulmonary/chest: Effort normal Neurologic: Patient is alert Skin: Skin is warm Psychiatric: Patient has normal mood and affect   Ortho Exam: Ortho exam demonstrates good cervical spine range of motion 5 out of 5 grip EPL FPL  interosseous wrist flexion extension bicep triceps and deltoid strength.  Not too much coarseness with internal and external rotation at 15 and 90 degrees of abduction on the left.  Does have some weakness to infraspinatus supraspinatus testing on the left at 5- out of 5 compared to 5+ out of 5 on the right.  Subscap strength full and symmetric.  No discrete AC joint tenderness to direct palpation.  No masses lymphadenopathy or skin changes noted in the shoulder region.  Specialty Comments:  No specialty comments available.  Imaging: No results found.   PMFS History: Patient Active Problem  List   Diagnosis Date Noted   BPH (benign prostatic hyperplasia) 05/24/2017   Diplopia 03/18/2014   Dizziness and giddiness 03/18/2014   Vision loss 03/18/2014   Sinusitis 02/03/2013   Anxiety and depression 09/13/2012   History of melanoma 06/30/2011   Hyperlipidemia 09/04/2009   ALLERGIC RHINITIS 09/04/2009   GERD 09/04/2009   Past Medical History:  Diagnosis Date   ALLERGIC RHINITIS 09/04/2009   Basal cell carcinoma    Cancer ([place]) 2012   melanoma   DYSLIPIDEMIA 09/04/2009   GERD 09/04/2009   High cholesterol    just above borderline per patient     Family History  Problem Relation Age of Onset   Heart disease Mother        CVA and cardiac arrhythmia, ? atrial fib   Stroke Mother    Hypertension Mother    Melanoma Father    Glaucoma Father    Glaucoma Brother     Past Surgical History:  Procedure Laterality Date   GALLBLADDER SURGERY  01/2018   SHOULDER SURGERY  1986, 2009   left shoulder scromoplasty   TONSILLECTOMY     TRIGGER FINGER RELEASE Left    Social History   Occupational History   Occupation: TEFL teacher      Comment: Estate manager/land agent  Tobacco Use   Smoking status: Former    Packs/day: 1.00    Years: 20.00    Pack years: 20.00    Types: Cigarettes    Quit date: 01/24/1982    Years since quitting: 39.1   Smokeless tobacco: Never  Vaping Use   Vaping Use: Never used  Substance and Sexual Activity   Alcohol use: Yes    Alcohol/week: 0.0 standard drinks    Comment: socially   Drug use: No   Sexual activity: Not on file

## 2021-03-16 ENCOUNTER — Telehealth: Payer: Self-pay | Admitting: Family Medicine

## 2021-03-16 ENCOUNTER — Telehealth: Payer: Self-pay | Admitting: Orthopedic Surgery

## 2021-03-16 MED ORDER — HYDROCODONE-ACETAMINOPHEN 10-325 MG PO TABS: 0.5000 | ORAL_TABLET | Freq: Three times a day (TID) | ORAL | 0 refills | Status: DC | PRN

## 2021-03-16 NOTE — Telephone Encounter (Signed)
[facility] calling patient. No answer. LMVM advising per Dr Randel Pigg note below.

## 2021-03-16 NOTE — Telephone Encounter (Signed)
Pt states he has an appt yesterday and was wondering if he can have shoulder replacement. Pt states it was not one of the options but that's what he prefers. Please call pt to discuss at [phone].

## 2021-03-16 NOTE — Telephone Encounter (Signed)
I have prescribed hydrocodone.  However the usual dose of hydrocodone which is 5/325 generally not available anymore as it has been on backorder.  I have prescribed a higher dose of hydrocodone 10/325.  Take one half of the pill at a time.  It should be available at the [facility] now.

## 2021-03-16 NOTE — Telephone Encounter (Signed)
Patient called stating that the 800mg  Ibuprofen that was prescribed does not seem to be helping his pain. He asked if Dr Georgina Snell would be able to prescribe Hydrocodone? This helped in the past and he would like to try this again until he is able to have surgery.   Please advise.

## 2021-03-16 NOTE — Telephone Encounter (Signed)
Rotator cuff intact except for small supraspinatus tear.  Not much if any glenohumeral joint arthritis.  Shoulder replacement not indicated in the setting of repairable rotator cuff tear and no glenohumeral joint arthritis.

## 2021-03-16 NOTE — Telephone Encounter (Signed)
Left pt a VM informing him.

## 2021-03-24 ENCOUNTER — Ambulatory Visit
Admission: RE | Admit: 2021-03-24 | Discharge: 2021-03-24 | Disposition: A | Discharge status: Home / Self Care | Payer: No Typology Code available for payment source | Source: Ambulatory Visit | Attending: Orthopedic Surgery | Admitting: Orthopedic Surgery

## 2021-03-24 ENCOUNTER — Other Ambulatory Visit: Payer: Self-pay

## 2021-03-24 DIAGNOSIS — M2578 Osteophyte, vertebrae: Secondary | ICD-10-CM | POA: Diagnosis not present

## 2021-03-24 DIAGNOSIS — M4802 Spinal stenosis, cervical region: Secondary | ICD-10-CM | POA: Diagnosis not present

## 2021-03-24 DIAGNOSIS — M792 Neuralgia and neuritis, unspecified: Secondary | ICD-10-CM

## 2021-03-24 IMAGING — MR MR CERVICAL SPINE W/O CM
5 series · 36 of 48 positions shown · non-contrast
Comparison: Radiographs [DATE].

CLINICAL DATA: Radicular pain in left arm [MV] ([MV]-CM).

EXAM:
MRI CERVICAL SPINE WITHOUT CONTRAST
TECHNIQUE: Multiplanar, multisequence MR imaging of the cervical spine was
performed. No intravenous contrast was administered.

[Series 2: T2 · sagittal · 3.0mm · 0.41mm/px · 8 of 17 slices shown (1 of 2)]
[im 1/17]
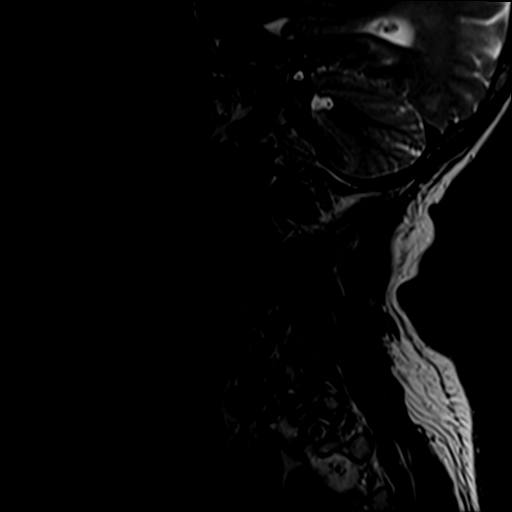
[im 3/17]
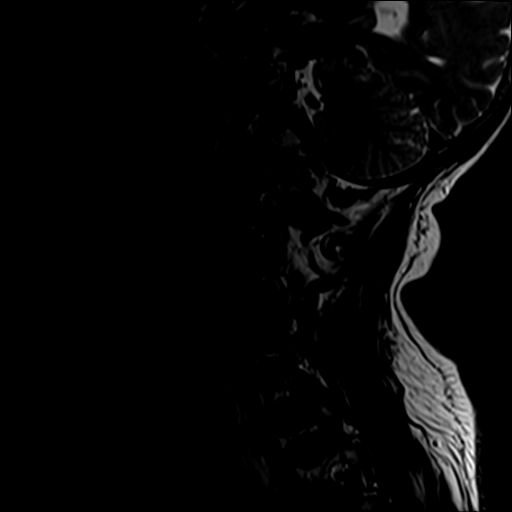
[im 5/17]
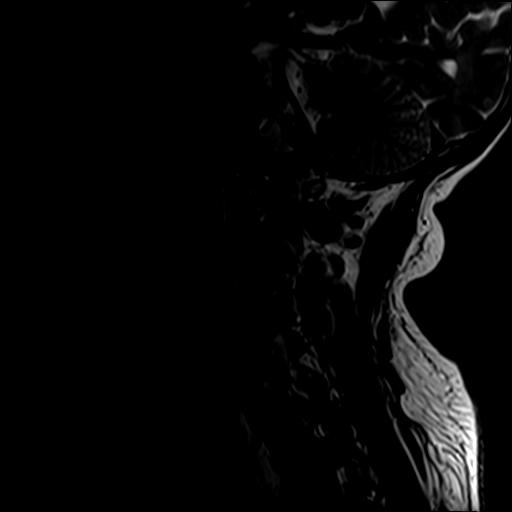
[im 7/17]
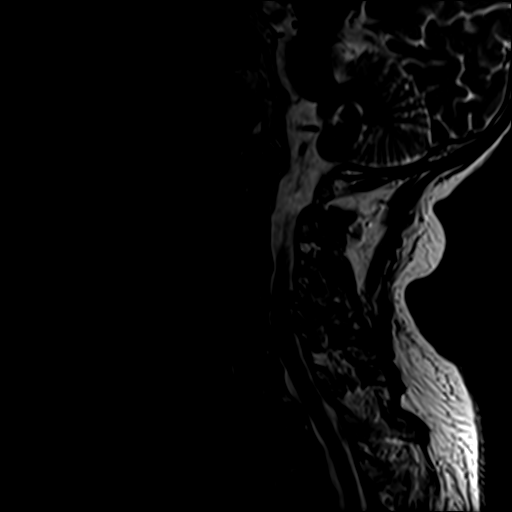
[im 10/17]
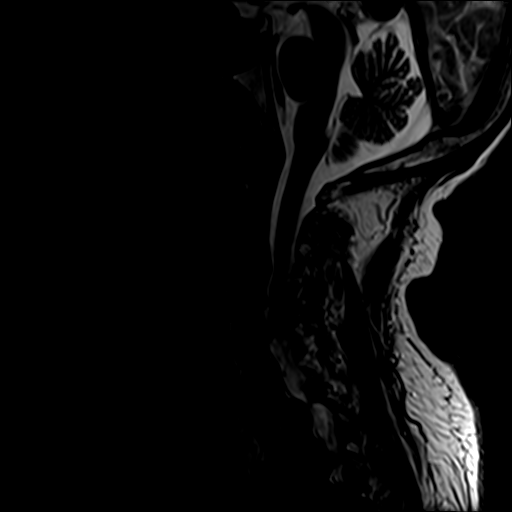
[im 12/17]
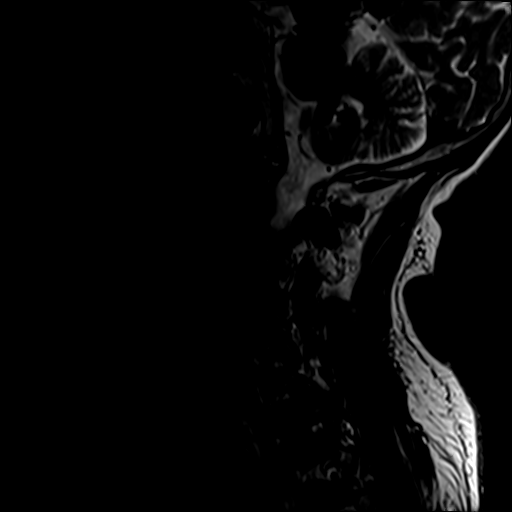
[im 14/17]
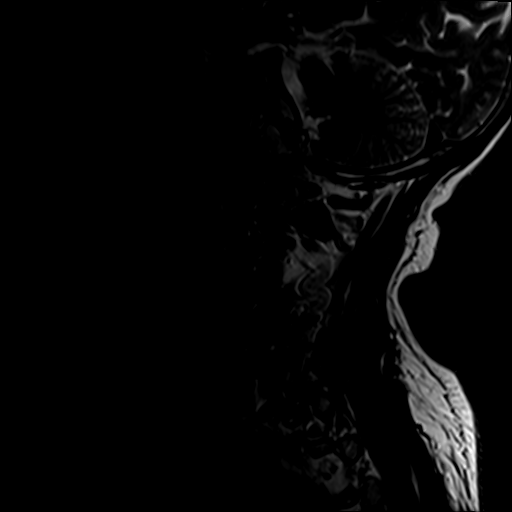
[im 17/17]
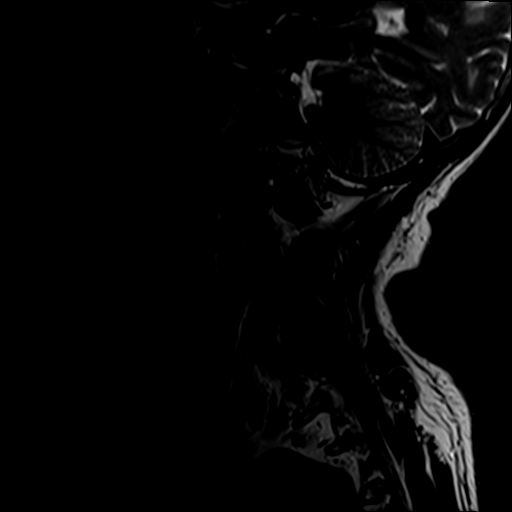

[Series 3: STIR · sagittal · 3.0mm · 0.82mm/px · 8 of 17 slices shown]
[im 1/17]
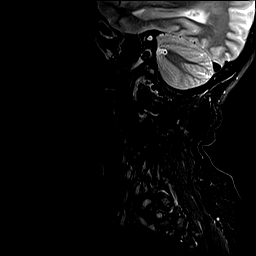
[im 3/17]
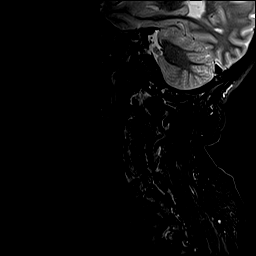
[im 5/17]
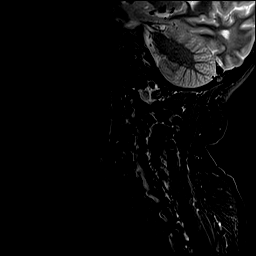
[im 7/17]
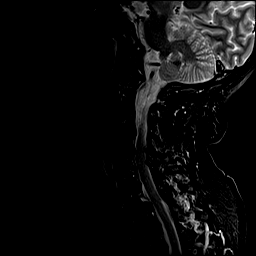
[im 10/17]
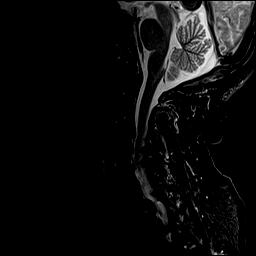
[im 12/17]
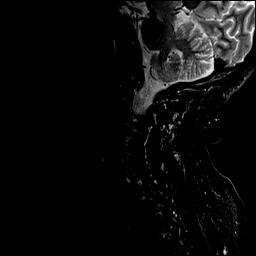
[im 14/17]
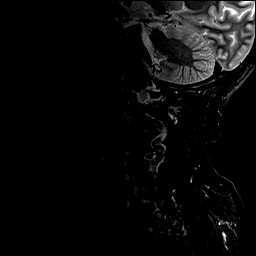
[im 17/17]
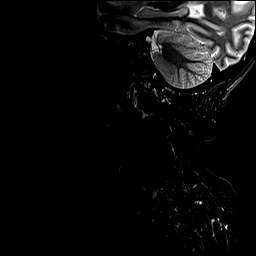

[Series 4: T1 · sagittal · 3.0mm · 0.82mm/px · 8 of 17 slices shown]
[im 1/17]
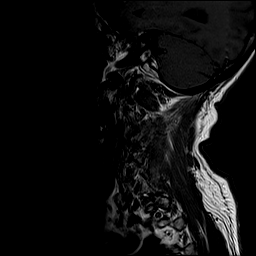
[im 3/17]
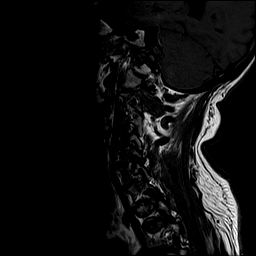
[im 5/17]
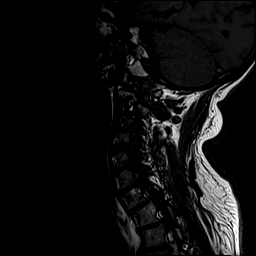
[im 7/17]
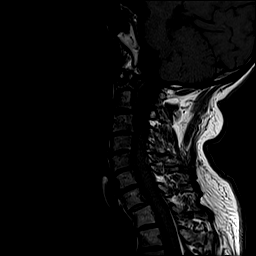
[im 10/17]
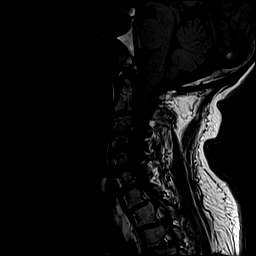
[im 12/17]
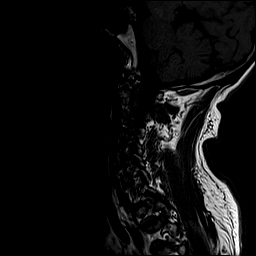
[im 14/17]
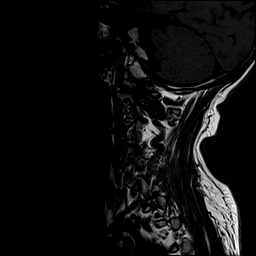
[im 17/17]
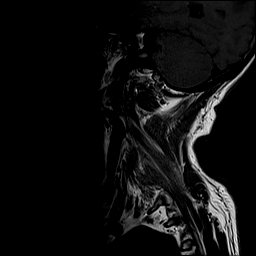

[Series 5: T2 · axial · 3.0mm · 0.70mm/px · z∈[-25,+59]mm · 9 of 26 slices shown (2 of 2)]
[im 1/26]
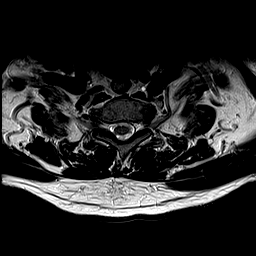
[im 5/26]
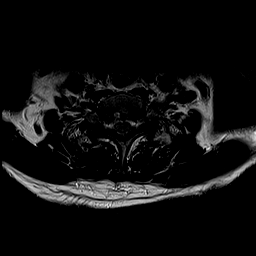
[im 7/26]
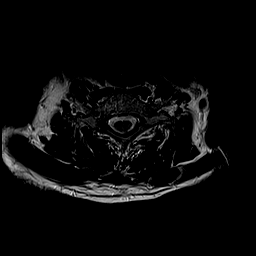
[im 12/26]
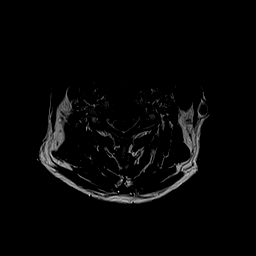
[im 14/26]
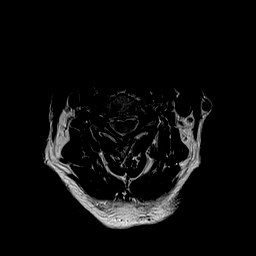
[im 19/26]
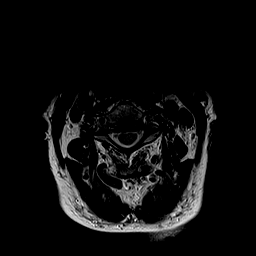
[im 21/26]
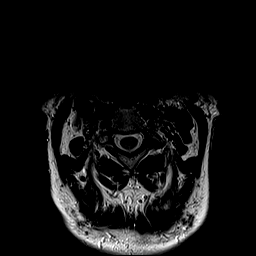
[im 23/26]
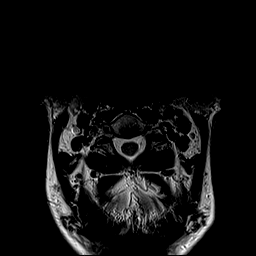
[im 26/26]
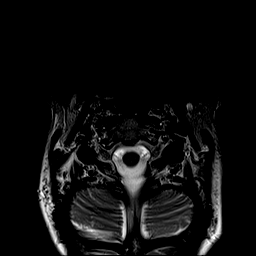

[Series 6: GRE · axial · 3.0mm · 0.35mm/px · z∈[-25,-5]mm · 3 of 26 slices shown]
[im 1/26]
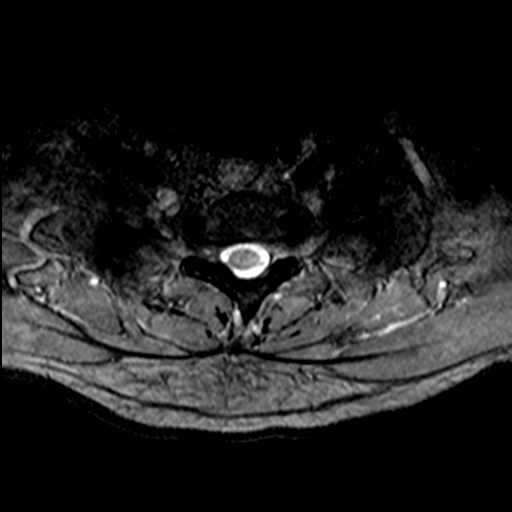
[im 5/26]
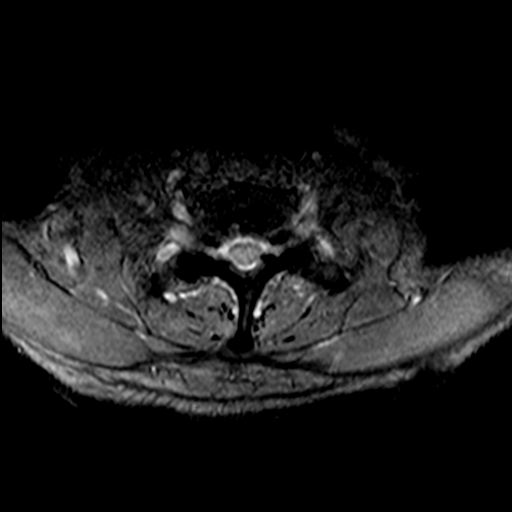
[im 7/26]
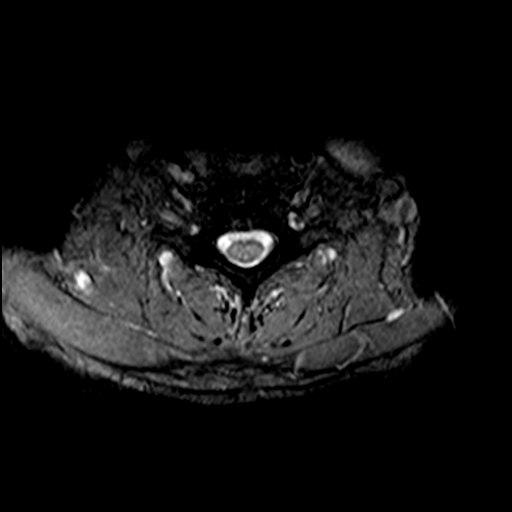

[36 of 48 positions shown; findings below may reference images not displayed]

FINDINGS: Alignment: Trace retrolisthesis of C4 over C5. Small anterolisthesis
of C7 over T1 and T1 over T2.

Vertebrae: No fracture, evidence of discitis, or bone lesion.
Congenitally small spinal canal.

Cord: Mild mass effect on the cord at C4-5 and C5-6. No cord signal
abnormality.

Posterior Fossa, vertebral arteries, paraspinal tissues: Negative.

Disc levels:

C2-3: No spinal canal or neural foraminal stenosis.

C3-4: Posterior disc osteophyte complex resulting mild spinal canal
stenosis. Uncovertebral and facet degenerative change resulting in
moderate bilateral neural foraminal narrowing.

C4-5: Posterior disc osteophyte complex resulting in mild spinal
canal stenosis. Uncovertebral and facet degenerative changes
resulting in severe right and moderate left neural foraminal
narrowing.

C5-6: Posterior disc osteophyte complex resulting in moderate spinal
canal stenosis. Uncovertebral and facet degenerative changes
resulting in moderate right and severe left neural foraminal
narrowing.

C6-7: Posterior disc osteophyte complex resulting in mild spinal
canal stenosis. Uncovertebral and facet degenerative changes
resulting moderate right and mild left neural foraminal narrowing.

C7-T1: Anterolisthesis, disc bulge and prominent hypertrophic facet
degenerative changes resulting in mild spinal canal stenosis and
severe bilateral neural foraminal narrowing.
IMPRESSION: 1. Degenerative changes of the cervical spine superimposed on a
congenitally small spinal canal resulting in mild spinal canal
stenosis at C3-4, C4-5, C6-7 and C7-T1 and moderate spinal canal
stenosis at C5-6.
2. Multilevel high-grade neural foraminal narrowing, as described
above.

## 2021-03-26 ENCOUNTER — Telehealth: Payer: Self-pay

## 2021-03-26 NOTE — Telephone Encounter (Signed)
Patient called stating that he is wanting a refill on his hydrocodone. States that he has 4 pills left but he is leaving out of the country Tuesday early morning, but is heading out of  2pm Monday afternoon. Patient wanting to know if the hydrocodoe can be sent to [facility] on [address] Monday morning. So he can make it through his vacation and then to his shoulder surgery that is in a couple weeks from now.  ? ? ?

## 2021-03-29 ENCOUNTER — Other Ambulatory Visit: Payer: Self-pay | Admitting: Family Medicine

## 2021-03-29 MED ORDER — HYDROCODONE-ACETAMINOPHEN 10-325 MG PO TABS: 0.5000 | ORAL_TABLET | Freq: Three times a day (TID) | ORAL | 0 refills | Status: DC | PRN

## 2021-03-29 NOTE — Telephone Encounter (Signed)
Medication sent.

## 2021-04-06 NOTE — Pre-Procedure Instructions (Signed)
Surgical Instructions ? ? ? Your procedure is scheduled on Tuesday 04/13/21. ? ? Report to [facility] Entrance "A" at 09:10 A.M., then check in with the Admitting office. ? Call this number if you have problems the morning of surgery: ? [phone] ? ? If you have any questions prior to your surgery date call [phone]: Open Monday-Friday 8am-4pm ? ? ? Remember: ? Do not eat after midnight the night before your surgery ? ?You may drink clear liquids until 08:10 A.M. the morning of your surgery.   ?Clear liquids allowed are: Water, Non-Citrus Juices (without pulp), Carbonated Beverages, Clear Tea, Black Coffee ONLY (NO MILK, CREAM OR POWDERED CREAMER of any kind), and Gatorade ?  ? ?Enhanced Recovery after Surgery for Orthopedics ?Enhanced Recovery after Surgery is a protocol used to improve the stress on your body and your recovery after surgery. ? ?Patient Instructions ? ?The day of surgery (if you do NOT have diabetes):  ?Drink ONE (1) Pre-Surgery Clear Ensure by 08:10 am the morning of surgery   ?This drink was given to you during your hospital  ?pre-op appointment visit. ?Nothing else to drink after completing the  ?Pre-Surgery Clear Ensure. ? ?       If you have questions, please contact your surgeon?s office.  ? Take these medicines the morning of surgery with A SIP OF WATER:  ? atorvastatin (LIPITOR) ? pantoprazole (PROTONIX)  ? tamsulosin [facility]) ? ? Take these medicines if needed:  ? HYDROcodone-acetaminophen (Laurium) ? ? ?As of today, STOP taking any Aspirin (unless otherwise instructed by your surgeon) Aleve, Naproxen, Ibuprofen, Motrin, Advil, Goody's, BC's, all herbal medications, fish oil, and all vitamins. ? ?         ?Do not wear jewelry or makeup ?Do not wear lotions, powders, perfumes/colognes, or deodorant. ?Do not shave 48 hours prior to surgery.  Men may shave face and neck. ?Do not bring valuables to the hospital. ?Do not wear nail polish, gel polish, artificial nails, or any other type  of covering on natural nails (fingers and toes) ?If you have artificial nails or gel coating that need to be removed by a nail salon, please have this removed prior to surgery. Artificial nails or gel coating may interfere with anesthesia's ability to adequately monitor your vital signs. ? ?[facility] is not responsible for any belongings or valuables. .  ? ?Do NOT Smoke (Tobacco/Vaping)  24 hours prior to your procedure ? ?If you use a CPAP at night, you may bring your mask for your overnight stay. ?  ?Contacts, glasses, hearing aids, dentures or partials may not be worn into surgery, please bring cases for these belongings ?  ?For patients admitted to the hospital, discharge time will be determined by your treatment team. ?  ?Patients discharged the day of surgery will not be allowed to drive home, and someone needs to stay with them for 24 hours. ? ?NO VISITORS WILL BE ALLOWED IN PRE-OP WHERE PATIENTS ARE PREPPED FOR SURGERY.  ONLY 1 SUPPORT PERSON MAY BE PRESENT IN THE WAITING ROOM WHILE YOU ARE IN SURGERY.  IF YOU ARE TO BE ADMITTED, ONCE YOU ARE IN YOUR ROOM YOU WILL BE ALLOWED TWO (2) VISITORS. 1 (ONE) VISITOR MAY STAY OVERNIGHT BUT MUST ARRIVE TO THE ROOM BY 8pm.  Minor children may have two parents present. Special consideration for safety and communication needs will be reviewed on a case by case basis. ? ?Special instructions:   ? ?Oral Hygiene is also important to  reduce your risk of infection.  Remember - BRUSH YOUR TEETH THE MORNING OF SURGERY WITH YOUR REGULAR TOOTHPASTE ? ? ?[facility]- Preparing For Surgery ? ?Before surgery, you can play an important role. Because skin is not sterile, your skin needs to be as free of germs as possible. You can reduce the number of germs on your skin by washing with CHG (chlorahexidine gluconate) Soap before surgery.  CHG is an antiseptic cleaner which kills germs and bonds with the skin to continue killing germs even after washing.   ? ? ?Please do not use if you  have an allergy to CHG or antibacterial soaps. If your skin becomes reddened/irritated stop using the CHG.  ?Do not shave (including legs and underarms) for at least 48 hours prior to first CHG shower. It is OK to shave your face. ? ?Please follow these instructions carefully. ?  ? ? Shower the NIGHT BEFORE SURGERY and the MORNING OF SURGERY with CHG Soap.  ? If you chose to wash your hair, wash your hair first as usual with your normal shampoo. After you shampoo, rinse your hair and body thoroughly to remove the shampoo.  Then [employer] and genitals (private parts) with your normal soap and rinse thoroughly to remove soap. ? ?After that Use CHG Soap as you would any other liquid soap. You can apply CHG directly to the skin and wash gently with a scrungie or a clean washcloth.  ? ?Apply the CHG Soap to your body ONLY FROM THE NECK DOWN.  Do not use on open wounds or open sores. Avoid contact with your eyes, ears, mouth and genitals (private parts). Wash Face and genitals (private parts)  with your normal soap.  ? ?Wash thoroughly, paying special attention to the area where your surgery will be performed. ? ?Thoroughly rinse your body with warm water from the neck down. ? ?DO NOT shower/wash with your normal soap after using and rinsing off the CHG Soap. ? ?Pat yourself dry with a CLEAN TOWEL. ? ?Wear CLEAN PAJAMAS to bed the night before surgery ? ?Place CLEAN SHEETS on your bed the night before your surgery ? ?DO NOT SLEEP WITH PETS. ? ? ?Day of Surgery: ? ?Take a shower with CHG soap. ?Wear Clean/Comfortable clothing the morning of surgery ?Do not apply any deodorants/lotions.   ?Remember to brush your teeth WITH YOUR REGULAR TOOTHPASTE. ? ? ? ?COVID testing ? ?If you are going to stay overnight or be admitted after your procedure/surgery and require a pre-op COVID test, please follow these instructions after your COVID test  ? ?You are not required to quarantine however you are required to wear a well-fitting  mask when you are out and around people not in your household.  If your mask becomes wet or soiled, replace with a new one. ? ?Wash your hands often with soap and water for 20 seconds or clean your hands with an alcohol-based hand sanitizer that contains at least 60% alcohol. ? ?Do not share personal items. ? ?Notify your provider: ?if you are in close contact with someone who has COVID  ?or if you develop a fever of 100.4 or greater, sneezing, cough, sore throat, shortness of breath or body aches. ? ?  ?Please read over the following fact sheets that you were given.  ? ?

## 2021-04-07 ENCOUNTER — Inpatient Hospital Stay (HOSPITAL_COMMUNITY)
Admission: RE | Admit: 2021-04-07 | Discharge: 2021-04-07 | Disposition: A | Discharge status: Home / Self Care | Payer: No Typology Code available for payment source | Source: Ambulatory Visit

## 2021-04-09 ENCOUNTER — Encounter (HOSPITAL_COMMUNITY): Payer: Self-pay

## 2021-04-09 ENCOUNTER — Encounter (HOSPITAL_COMMUNITY)
Admission: RE | Admit: 2021-04-09 | Discharge: 2021-04-09 | Disposition: A | Discharge status: Home / Self Care | Payer: No Typology Code available for payment source | Source: Ambulatory Visit | Attending: Orthopedic Surgery | Admitting: Orthopedic Surgery

## 2021-04-09 ENCOUNTER — Other Ambulatory Visit: Payer: Self-pay

## 2021-04-09 DIAGNOSIS — Z01812 Encounter for preprocedural laboratory examination: Secondary | ICD-10-CM | POA: Diagnosis not present

## 2021-04-09 DIAGNOSIS — Z01818 Encounter for other preprocedural examination: Secondary | ICD-10-CM

## 2021-04-09 LAB — CBC
HCT: 37.6 % — ABNORMAL LOW (ref 39.0–52.0)
Hemoglobin: 12.8 g/dL — ABNORMAL LOW (ref 13.0–17.0)
MCH: 32.1 pg (ref 26.0–34.0)
MCHC: 34 g/dL (ref 30.0–36.0)
MCV: 94.2 fL (ref 80.0–100.0)
Platelets: 171 10*3/uL (ref 150–400)
RBC: 3.99 MIL/uL — ABNORMAL LOW (ref 4.22–5.81)
RDW: 11.8 % (ref 11.5–15.5)
WBC: 5.7 10*3/uL (ref 4.0–10.5)
nRBC: 0 % (ref 0.0–0.2)

## 2021-04-09 LAB — BASIC METABOLIC PANEL
Anion gap: 7 (ref 5–15)
BUN: 11 mg/dL (ref 8–23)
CO2: 27 mmol/L (ref 22–32)
Calcium: 9 mg/dL (ref 8.9–10.3)
Chloride: 104 mmol/L (ref 98–111)
Creatinine, Ser: 0.76 mg/dL (ref 0.61–1.24)
GFR, Estimated: >60 mL/min (ref >60)
Glucose, Bld: 121 mg/dL — ABNORMAL HIGH (ref 70–99)
Potassium: 3.7 mmol/L (ref 3.5–5.1)
Sodium: 138 mmol/L (ref 135–145)

## 2021-04-09 NOTE — Progress Notes (Signed)
PCP - Dr. Carolann Littler ?Cardiologist - Denies ? ?Chest x-ray - Not indicated ?EKG - 07/06/20 ?Stress Test - Denies ?ECHO - Denies ?Cardiac Cath - Denies ? ?Sleep Study - Denies ? ?DM - Denies ? ?ERAS Protcol - yes ?PRE-SURGERY Ensure given  ? ?COVID TEST- Not indicated ? ? ?Anesthesia review: No ? ?Patient denies shortness of breath, fever, cough and chest pain at PAT appointment ? ? ?All instructions explained to the patient, with a verbal understanding of the material. Patient agrees to go over the instructions while at home for a better understanding. The opportunity to ask questions was provided. ? ? ?

## 2021-04-13 ENCOUNTER — Ambulatory Visit (HOSPITAL_BASED_OUTPATIENT_CLINIC_OR_DEPARTMENT_OTHER): Payer: No Typology Code available for payment source | Admitting: Anesthesiology

## 2021-04-13 ENCOUNTER — Ambulatory Visit (HOSPITAL_COMMUNITY): Payer: No Typology Code available for payment source | Admitting: Anesthesiology

## 2021-04-13 ENCOUNTER — Encounter (HOSPITAL_COMMUNITY): Payer: Self-pay | Admitting: Orthopedic Surgery

## 2021-04-13 ENCOUNTER — Other Ambulatory Visit: Payer: Self-pay

## 2021-04-13 ENCOUNTER — Encounter: Payer: Self-pay | Admitting: Orthopedic Surgery

## 2021-04-13 ENCOUNTER — Encounter (HOSPITAL_COMMUNITY)
Admission: RE | Disposition: A | Discharge status: Home / Self Care | Payer: Self-pay | Source: Ambulatory Visit | Attending: Orthopedic Surgery

## 2021-04-13 ENCOUNTER — Ambulatory Visit (HOSPITAL_COMMUNITY)
Admission: RE | Admit: 2021-04-13 | Discharge: 2021-04-13 | Disposition: A | Discharge status: Home / Self Care | Payer: No Typology Code available for payment source | Source: Ambulatory Visit | Attending: Orthopedic Surgery | Admitting: Orthopedic Surgery

## 2021-04-13 DIAGNOSIS — M75122 Complete rotator cuff tear or rupture of left shoulder, not specified as traumatic: Secondary | ICD-10-CM

## 2021-04-13 DIAGNOSIS — G8918 Other acute postprocedural pain: Secondary | ICD-10-CM | POA: Diagnosis not present

## 2021-04-13 DIAGNOSIS — Z87891 Personal history of nicotine dependence: Secondary | ICD-10-CM | POA: Diagnosis not present

## 2021-04-13 DIAGNOSIS — M7522 Bicipital tendinitis, left shoulder: Secondary | ICD-10-CM

## 2021-04-13 DIAGNOSIS — K219 Gastro-esophageal reflux disease without esophagitis: Secondary | ICD-10-CM | POA: Diagnosis not present

## 2021-04-13 DIAGNOSIS — M75101 Unspecified rotator cuff tear or rupture of right shoulder, not specified as traumatic: Secondary | ICD-10-CM

## 2021-04-13 DIAGNOSIS — S43432A Superior glenoid labrum lesion of left shoulder, initial encounter: Secondary | ICD-10-CM

## 2021-04-13 DIAGNOSIS — M7521 Bicipital tendinitis, right shoulder: Secondary | ICD-10-CM

## 2021-04-13 DIAGNOSIS — Z01818 Encounter for other preprocedural examination: Secondary | ICD-10-CM

## 2021-04-13 DIAGNOSIS — M75102 Unspecified rotator cuff tear or rupture of left shoulder, not specified as traumatic: Secondary | ICD-10-CM | POA: Insufficient documentation

## 2021-04-13 DIAGNOSIS — M25512 Pain in left shoulder: Secondary | ICD-10-CM | POA: Diagnosis present

## 2021-04-13 HISTORY — PX: SHOULDER ARTHROSCOPY WITH OPEN ROTATOR CUFF REPAIR AND DISTAL CLAVICLE ACROMINECTOMY: SHX5683

## 2021-04-13 SURGERY — SHOULDER ARTHROSCOPY WITH OPEN ROTATOR CUFF REPAIR AND DISTAL CLAVICLE ACROMINECTOMY
Anesthesia: General | Site: Shoulder | Laterality: Left

## 2021-04-13 MED ORDER — DEXAMETHASONE SODIUM PHOSPHATE 10 MG/ML IJ SOLN
INTRAMUSCULAR | Status: AC
  Filled 2021-04-13: qty 1

## 2021-04-13 MED ORDER — CHLORHEXIDINE GLUCONATE 0.12 % MT SOLN
15.0000 mL | Freq: Once | OROMUCOSAL | Status: AC
  Administered 2021-04-13: 15 mL via OROMUCOSAL
  Filled 2021-04-13: qty 15

## 2021-04-13 MED ORDER — IBUPROFEN 800 MG PO TABS: 800.0000 mg | ORAL_TABLET | Freq: Three times a day (TID) | ORAL | 0 refills | Status: DC | PRN

## 2021-04-13 MED ORDER — ONDANSETRON HCL 4 MG/2ML IJ SOLN
INTRAMUSCULAR | Status: AC
  Filled 2021-04-13: qty 2

## 2021-04-13 MED ORDER — EPHEDRINE SULFATE-NACL 50-0.9 MG/10ML-% IV SOSY
PREFILLED_SYRINGE | INTRAVENOUS | Status: DC | PRN
  Administered 2021-04-13: 5 mg via INTRAVENOUS

## 2021-04-13 MED ORDER — SUGAMMADEX SODIUM 200 MG/2ML IV SOLN
INTRAVENOUS | Status: DC | PRN
  Administered 2021-04-13: 150 mg via INTRAVENOUS

## 2021-04-13 MED ORDER — ORAL CARE MOUTH RINSE: 15.0000 mL | Freq: Once | OROMUCOSAL | Status: AC

## 2021-04-13 MED ORDER — POVIDONE-IODINE 7.5 % EX SOLN
Freq: Once | CUTANEOUS | Status: DC
  Filled 2021-04-13: qty 118

## 2021-04-13 MED ORDER — LIDOCAINE 2% (20 MG/ML) 5 ML SYRINGE
INTRAMUSCULAR | Status: DC | PRN
  Administered 2021-04-13: 60 mg via INTRAVENOUS

## 2021-04-13 MED ORDER — PROPOFOL 10 MG/ML IV BOLUS
INTRAVENOUS | Status: AC
  Filled 2021-04-13: qty 20

## 2021-04-13 MED ORDER — MIDAZOLAM HCL 2 MG/2ML IJ SOLN: 2.0000 mg | Freq: Once | INTRAMUSCULAR | Status: AC

## 2021-04-13 MED ORDER — FENTANYL CITRATE (PF) 100 MCG/2ML IJ SOLN: 100.0000 ug | Freq: Once | INTRAMUSCULAR | Status: AC

## 2021-04-13 MED ORDER — FENTANYL CITRATE (PF) 250 MCG/5ML IJ SOLN
INTRAMUSCULAR | Status: AC
  Filled 2021-04-13: qty 5

## 2021-04-13 MED ORDER — POVIDONE-IODINE 10 % EX SWAB
2.0000 "application " | Freq: Once | CUTANEOUS | Status: AC
  Administered 2021-04-13: 2 via TOPICAL

## 2021-04-13 MED ORDER — LACTATED RINGERS IV SOLN: INTRAVENOUS | Status: DC

## 2021-04-13 MED ORDER — HYDROCODONE-ACETAMINOPHEN 10-325 MG PO TABS: 1.0000 | ORAL_TABLET | ORAL | 0 refills | Status: DC | PRN

## 2021-04-13 MED ORDER — LIDOCAINE 2% (20 MG/ML) 5 ML SYRINGE
INTRAMUSCULAR | Status: AC
  Filled 2021-04-13: qty 5

## 2021-04-13 MED ORDER — PROPOFOL 10 MG/ML IV BOLUS
INTRAVENOUS | Status: DC | PRN
  Administered 2021-04-13: 130 mg via INTRAVENOUS
  Administered 2021-04-13: 20 mg via INTRAVENOUS

## 2021-04-13 MED ORDER — BUPIVACAINE LIPOSOME 1.3 % IJ SUSP
INTRAMUSCULAR | Status: DC | PRN
  Administered 2021-04-13: 10 mL via PERINEURAL

## 2021-04-13 MED ORDER — 0.9 % SODIUM CHLORIDE (POUR BTL) OPTIME
TOPICAL | Status: DC | PRN
  Administered 2021-04-13: 1000 mL

## 2021-04-13 MED ORDER — CEFAZOLIN SODIUM-DEXTROSE 2-4 GM/100ML-% IV SOLN
2.0000 g | INTRAVENOUS | Status: AC
  Administered 2021-04-13: 2 g via INTRAVENOUS
  Filled 2021-04-13: qty 100

## 2021-04-13 MED ORDER — PHENYLEPHRINE HCL-NACL 20-0.9 MG/250ML-% IV SOLN
INTRAVENOUS | Status: DC | PRN
Start: 2021-04-13 — End: 2021-04-13
  Administered 2021-04-13: 40 ug/min via INTRAVENOUS

## 2021-04-13 MED ORDER — DEXAMETHASONE SODIUM PHOSPHATE 10 MG/ML IJ SOLN
INTRAMUSCULAR | Status: DC | PRN
Start: 2021-04-13 — End: 2021-04-13
  Administered 2021-04-13: 10 mg via INTRAVENOUS

## 2021-04-13 MED ORDER — EPHEDRINE 5 MG/ML INJ
INTRAVENOUS | Status: AC
  Filled 2021-04-13: qty 5

## 2021-04-13 MED ORDER — ROCURONIUM BROMIDE 10 MG/ML (PF) SYRINGE
PREFILLED_SYRINGE | INTRAVENOUS | Status: AC
  Filled 2021-04-13: qty 10

## 2021-04-13 MED ORDER — ROCURONIUM BROMIDE 10 MG/ML (PF) SYRINGE
PREFILLED_SYRINGE | INTRAVENOUS | Status: DC | PRN
  Administered 2021-04-13: 70 mg via INTRAVENOUS

## 2021-04-13 MED ORDER — FENTANYL CITRATE (PF) 100 MCG/2ML IJ SOLN
INTRAMUSCULAR | Status: AC
  Administered 2021-04-13: 100 ug via INTRAVENOUS
  Filled 2021-04-13: qty 2

## 2021-04-13 MED ORDER — BUPIVACAINE HCL (PF) 0.5 % IJ SOLN
INTRAMUSCULAR | Status: DC | PRN
  Administered 2021-04-13: 20 mL via PERINEURAL

## 2021-04-13 MED ORDER — EPINEPHRINE PF 1 MG/ML IJ SOLN
INTRAMUSCULAR | Status: AC
  Filled 2021-04-13: qty 2

## 2021-04-13 MED ORDER — MIDAZOLAM HCL 2 MG/2ML IJ SOLN
INTRAMUSCULAR | Status: AC
  Administered 2021-04-13: 2 mg via INTRAVENOUS
  Filled 2021-04-13: qty 2

## 2021-04-13 MED ORDER — ONDANSETRON HCL 4 MG/2ML IJ SOLN
INTRAMUSCULAR | Status: DC | PRN
  Administered 2021-04-13: 4 mg via INTRAVENOUS

## 2021-04-13 MED ORDER — SODIUM CHLORIDE 0.9 % IR SOLN
Status: DC | PRN
  Administered 2021-04-13: 3000 mL

## 2021-04-13 SURGICAL SUPPLY — 74 items
ANCHOR FBRTK 2.6 SUTURETAP 1.3 (Anchor) ×3 IMPLANT
ANCHOR SUT 1.8 FIBERTAK SB KL (Anchor) ×1 IMPLANT
ANCHOR SWIVELOCK BIO 4.75X19.1 (Anchor) ×3 IMPLANT
BAG COUNTER SPONGE SURGICOUNT (BAG) ×2 IMPLANT
BLADE EXCALIBUR 4.0X13 (MISCELLANEOUS) ×1 IMPLANT
BLADE SURG 11 STRL SS (BLADE) ×2 IMPLANT
BLADE SURG 15 STRL LF DISP TIS (BLADE) IMPLANT
BLADE SURG 15 STRL SS (BLADE) ×2
BUR OVAL 6.0 (BURR) IMPLANT
COOLER ICEMAN CLASSIC (MISCELLANEOUS) ×1 IMPLANT
COVER SURGICAL LIGHT HANDLE (MISCELLANEOUS) ×3 IMPLANT
DRAPE INCISE IOBAN 66X45 STRL (DRAPES) ×4 IMPLANT
DRAPE STERI 35X30 U-POUCH (DRAPES) ×2 IMPLANT
DRAPE U-SHAPE 47X51 STRL (DRAPES) ×4 IMPLANT
DRSG PAD ABDOMINAL 8X10 ST (GAUZE/BANDAGES/DRESSINGS) ×3 IMPLANT
DRSG TEGADERM 4X4.75 (GAUZE/BANDAGES/DRESSINGS) ×7 IMPLANT
DRSG XEROFORM 1X8 (GAUZE/BANDAGES/DRESSINGS) ×1 IMPLANT
DURAPREP 26ML APPLICATOR (WOUND CARE) ×2 IMPLANT
ELECT REM PT RETURN 9FT ADLT (ELECTROSURGICAL) ×2
ELECTRODE REM PT RTRN 9FT ADLT (ELECTROSURGICAL) ×1 IMPLANT
FILTER STRAW FLUID ASPIR (MISCELLANEOUS) ×2 IMPLANT
GAUZE SPONGE 4X4 12PLY STRL (GAUZE/BANDAGES/DRESSINGS) ×1 IMPLANT
GAUZE SPONGE 4X4 12PLY STRL LF (GAUZE/BANDAGES/DRESSINGS) ×1 IMPLANT
GAUZE XEROFORM 1X8 LF (GAUZE/BANDAGES/DRESSINGS) ×1 IMPLANT
GLOVE SRG 8 PF TXTR STRL LF DI (GLOVE) ×1 IMPLANT
GLOVE SURG LTX SZ8 (GLOVE) ×2 IMPLANT
GLOVE SURG UNDER POLY LF SZ8 (GLOVE) ×2
GOWN STRL REUS W/ TWL LRG LVL3 (GOWN DISPOSABLE) ×3 IMPLANT
GOWN STRL REUS W/TWL LRG LVL3 (GOWN DISPOSABLE) ×6
KIT BASIN OR (CUSTOM PROCEDURE TRAY) ×2 IMPLANT
KIT STR SPEAR 1.8 FBRTK DISP (KITS) ×1 IMPLANT
KIT TURNOVER KIT B (KITS) ×2 IMPLANT
MANIFOLD NEPTUNE II (INSTRUMENTS) ×2 IMPLANT
NDL HYPO 25X1 1.5 SAFETY (NEEDLE) ×1 IMPLANT
NDL SCORPION MULTI FIRE (NEEDLE) IMPLANT
NDL SPNL 18GX3.5 QUINCKE PK (NEEDLE) ×1 IMPLANT
NDL SUT 6 .5 CRC .975X.05 MAYO (NEEDLE) IMPLANT
NEEDLE HYPO 25X1 1.5 SAFETY (NEEDLE) ×2 IMPLANT
NEEDLE MAYO TAPER (NEEDLE)
NEEDLE SCORPION MULTI FIRE (NEEDLE) ×2 IMPLANT
NEEDLE SPNL 18GX3.5 QUINCKE PK (NEEDLE) ×2 IMPLANT
NS IRRIG 1000ML POUR BTL (IV SOLUTION) ×2 IMPLANT
PACK SHOULDER (CUSTOM PROCEDURE TRAY) ×2 IMPLANT
PAD ARMBOARD 7.5X6 YLW CONV (MISCELLANEOUS) ×4 IMPLANT
PAD COLD SHLDR WRAP-ON (PAD) ×1 IMPLANT
PORT APPOLLO RF 90DEGREE MULTI (SURGICAL WAND) ×1 IMPLANT
RESTRAINT HEAD UNIVERSAL NS (MISCELLANEOUS) ×2 IMPLANT
SLING ARM IMMOBILIZER LRG (SOFTGOODS) ×1 IMPLANT
SLING ARM IMMOBILIZER MED (SOFTGOODS) IMPLANT
SPONGE T-LAP 4X18 ~~LOC~~+RFID (SPONGE) ×5 IMPLANT
STRIP CLOSURE SKIN 1/2X4 (GAUZE/BANDAGES/DRESSINGS) ×2 IMPLANT
SUCTION FRAZIER HANDLE 10FR (MISCELLANEOUS)
SUCTION TUBE FRAZIER 10FR DISP (MISCELLANEOUS) ×1 IMPLANT
SUT ETHILON 3 0 PS 1 (SUTURE) ×2 IMPLANT
SUT FIBERWIRE #2 38 T-5 BLUE (SUTURE)
SUT MNCRL AB 3-0 PS2 18 (SUTURE) ×2 IMPLANT
SUT VIC AB 0 CT1 27 (SUTURE) ×2
SUT VIC AB 0 CT1 27XBRD ANBCTR (SUTURE) ×1 IMPLANT
SUT VIC AB 1 CT1 27 (SUTURE) ×4
SUT VIC AB 1 CT1 27XBRD ANBCTR (SUTURE) ×1 IMPLANT
SUT VIC AB 2-0 CT1 27 (SUTURE) ×2
SUT VIC AB 2-0 CT1 TAPERPNT 27 (SUTURE) ×1 IMPLANT
SUT VICRYL 0 UR6 27IN ABS (SUTURE) ×7 IMPLANT
SUTURE FIBERWR #2 38 T-5 BLUE (SUTURE) IMPLANT
SYR 20ML LL LF (SYRINGE) ×3 IMPLANT
SYR 3ML LL SCALE MARK (SYRINGE) ×1 IMPLANT
SYR TB 1ML LUER SLIP (SYRINGE) ×1 IMPLANT
SYS FBRTK BUTTON 2.6 (Anchor) ×2 IMPLANT
SYSTEM FBRTK BUTTON 2.6 (Anchor) IMPLANT
TOWEL GREEN STERILE (TOWEL DISPOSABLE) ×2 IMPLANT
TOWEL GREEN STERILE FF (TOWEL DISPOSABLE) ×2 IMPLANT
TUBING ARTHROSCOPY IRRIG 16FT (MISCELLANEOUS) ×2 IMPLANT
WAND SERFAS ENERGY CRUISE 90S (SURGICAL WAND) IMPLANT
WATER STERILE IRR 1000ML POUR (IV SOLUTION) ×1 IMPLANT

## 2021-04-13 NOTE — Op Note (Signed)
NAME: Dennis Villarreal, Dennis Villarreal ?MEDICAL RECORD NO: [id number] ?ACCOUNT NO: [id number] ?DATE OF BIRTH: 1950/12/02 ?FACILITY: [facility] ?LOCATION: [facility]PERI[facility] ?PHYSICIAN: Yetta Barre. Marlou Sa, MD ? ?Operative Report  ? ?DATE OF PROCEDURE: 04/13/2021 ? ?PREOPERATIVE DIAGNOSES:  Left shoulder rotator cuff tear and biceps tendinitis. ? ?POSTOPERATIVE DIAGNOSES:  Left shoulder rotator cuff tear and biceps tendinitis. ? ?PROCEDURE:  Left shoulder arthroscopy with limited debridement of the superior labrum and anterior labrum with mini open rotator cuff tear repair and biceps tenodesis ? ?SURGEON:  Yetta Barre. Marlou Sa, MD ? ?ASSISTANT:  Annie Main. ? ?INDICATIONS:  The patient is a 71 year old patient with significant left shoulder pain, who presents for operative management after explanation of risks and benefits.  MRI scan shows biceps tendinitis along with rotator cuff tearing of the supraspinatus. ? ?DESCRIPTION OF PROCEDURE:  The patient was brought to the operating room where general anesthetic was induced.  Preoperative antibiotics administered.  Timeout was called.  Left shoulder prescrubbed with alcohol and Betadine, allowed to air dry, prepped  ?with DuraPrep solution and draped in sterile manner.  Ioban used to seal the operative field.  Head was in neutral position.  Timeout was called.  Posterior portal created 2 cm inferior and medial to the posterolateral margin of acromion.  Diagnostic  ?arthroscopy was performed.  The patient had significant biceps tendinitis and degenerative flap tearing. The superior labrum was debrided through an anterior portal created under direct visualization.  Biceps tendon released.  Glenohumeral articular  ?surfaces intact.  Anterior inferior, posterior inferior glenohumeral ligaments intact.  The patient also had about 98% thickness tear of the supraspinatus tendon, which was degenerative in nature.  After arthroscopy and debridement, instruments removed.  ? Portals were closed using 3-0 nylon.  Ioban  then used to cover the entire operative field.  Incision made off the anterolateral margin of the acromion.  Skin and subcutaneous tissue sharply divided.  Deltoid was split between the middle and anterior  ?raphae and marked with #1 Vicryl suture 4 cm distal to the anterolateral margin of the acromion.  A bursectomy performed.  Self-retaining retractor placed.  Biceps tendon was then tenodesed in the bicipital groove under appropriate tension using Arthrex  ?1.9 all-suture suture anchor distally and a 3.2 all-suture suture anchor proximally.  This was done after putting FiberLoop suture into the biceps tendon.  Construct was oversewn with 0 Vicryl.  Overall, very good stability was achieved.  Next, attention ? was directed towards the rotator cuff.  Acromioplasty performed, which required only 1-2 mm of bone removal.  The patient had a very significant tear involving all of the supraspinatus and part of the infraspinatus.  This was about a 2.5 cm tear x 1.5  ?cm tear.  After debriding degenerative and nonviable rotator cuff tendon from the lateral edges, five 0 Vicryl traction sutures were placed at the leading edge of the tendon.  Next, 3 Arthrex SutureTape anchors with 4 SutureTapes on each anchor were  ?placed into very good quality bone at the articular surface margin and greater tuberosity margin. Using the Scorpion suture passer, the SutureTapes were passed x12.  Tied x6.  Crossed.  Next, the Vicryl sutures were placed in one SwiveLock placed into  ?the metaphysis, which really brought the rotator cuff tendon down nicely.  The SutureTapes were crossed and placed into the SwiveLocks anteriorly and posteriorly for a watertight repair.  Arm was taken through range of motion, found to have no cords,  ?grinding or crepitus.  Thorough irrigation performed.  Deltoid  closed back using #1 Vicryl suture.  Skin closed using 0 Vicryl suture, 2-0 Vicryl suture, and 3-0 Monocryl with Steri-Strips and impervious dressings  applied as they were applied also to the ? portal sites.  Shoulder immobilizer placed.  The patient tolerated the procedure well without immediate complications, transferred to the recovery room in stable condition.  Luke's assistance was required at all times during the case for retraction,  ?mobilization of tissue, opening, closing.  His assistance was a medical necessity. ? ? ?[facility] ?D: 04/13/2021 2:17:01 pm T: 04/13/2021 10:29:00 pm  ?JOB: [id number]/ [id number]  ?

## 2021-04-13 NOTE — Transfer of Care (Signed)
Immediate Anesthesia Transfer of Care Note ? ?Patient: KALIEL BOLDS ? ?Procedure(s) Performed: LEFT SHOULDER ARTHROSCOPY, DEBRIDEMENT, MINI OPEN ROTATOR CUFF TEAR REPAIR, BICEPS TENODESIS (Left: Shoulder) ? ?Patient Location: PACU ? ?Anesthesia Type:General ? ?Level of Consciousness: awake, oriented and patient cooperative ? ?Airway & Oxygen Therapy: Patient Spontanous Breathing and Patient connected to nasal cannula oxygen ? ?Post-op Assessment: Report given to RN and Post -op Vital signs reviewed and stable ? ?Post vital signs: Reviewed ? ?Last Vitals:  ?Vitals Value Taken Time  ?BP 158/98 04/13/21 1405  ?Temp    ?Pulse 74 04/13/21 1406  ?Resp 24 04/13/21 1406  ?SpO2 98 % 04/13/21 1406  ?Vitals shown include unvalidated device data. ? ?Last Pain:  ?Vitals:  ? 04/13/21 0924  ?TempSrc:   ?PainSc: 0-No pain  ?   ? ?  ? ?Complications: No notable events documented. ?

## 2021-04-13 NOTE — Anesthesia Postprocedure Evaluation (Signed)
Anesthesia Post Note ? ?Patient: Dennis Villarreal ? ?Procedure(s) Performed: LEFT SHOULDER ARTHROSCOPY, DEBRIDEMENT, MINI OPEN ROTATOR CUFF TEAR REPAIR, BICEPS TENODESIS (Left: Shoulder) ? ?  ? ?Patient location during evaluation: PACU ?Anesthesia Type: General ?Level of consciousness: awake and alert ?Pain management: pain level controlled ?Vital Signs Assessment: post-procedure vital signs reviewed and stable ?Respiratory status: spontaneous breathing, nonlabored ventilation and respiratory function stable ?Cardiovascular status: blood pressure returned to baseline and stable ?Postop Assessment: no apparent nausea or vomiting ?Anesthetic complications: no ? ? ?No notable events documented. ? ?Last Vitals:  ?Vitals:  ? 04/13/21 1435 04/13/21 1444  ?BP: (!) 142/81 137/81  ?Pulse: 65 65  ?Resp: 18 20  ?Temp:  36.4 ?C  ?SpO2: 96% 96%  ?  ?Last Pain:  ?Vitals:  ? 04/13/21 1444  ?TempSrc:   ?PainSc: 0-No pain  ? ? ?  ?  ?  ?  ?  ?  ? ?Lynda Rainwater ? ? ? ? ?

## 2021-04-13 NOTE — Anesthesia Procedure Notes (Signed)
Procedure Name: Intubation ?Date/Time: 04/13/2021 11:42 AM ?Performed by: Jenne Campus, CRNA ?Pre-anesthesia Checklist: Patient identified, Emergency Drugs available, Suction available and Patient being monitored ?Patient Re-evaluated:Patient Re-evaluated prior to induction ?Oxygen Delivery Method: Circle System Utilized ?Preoxygenation: Pre-oxygenation with 100% oxygen ?Induction Type: IV induction ?Ventilation: Mask ventilation without difficulty, Oral airway inserted - appropriate to patient size and Two handed mask ventilation required ?Laryngoscope Size: Sabra Heck and 3 ?Grade View: Grade I ?Tube type: Oral ?Tube size: 7.5 mm ?Number of attempts: 1 ?Airway Equipment and Method: Stylet and Oral airway ?Placement Confirmation: ETT inserted through vocal cords under direct vision, positive ETCO2 and breath sounds checked- equal and bilateral ?Secured at: 22 cm ?Tube secured with: Tape ?Dental Injury: Teeth and Oropharynx as per pre-operative assessment  ? ? ? ? ?

## 2021-04-13 NOTE — Brief Op Note (Signed)
? ?  04/13/2021 ? ?2:11 PM ? ?PATIENT:  Dennis Villarreal  71 y.o. male ? ?PRE-OPERATIVE DIAGNOSIS:  left shoulder rotator cuff tear, biceps tendonitis ? ?POST-OPERATIVE DIAGNOSIS:  left shoulder rotator cuff tear, biceps tendonitis ? ?PROCEDURE:  Procedure(s): ?LEFT SHOULDER ARTHROSCOPY, DEBRIDEMENT, MINI OPEN ROTATOR CUFF TEAR REPAIR, BICEPS TENODESIS ? ?SURGEON:  Surgeon(s): ?Meredith Pel, MD ? ?ASSISTANT: magnant pa ? ?ANESTHESIA:   general ? ?EBL: 20 ml   ? ?Total I/O ?In: 1000 [I.V.:1000] ?Out: 20 [Blood:20] ? ?BLOOD ADMINISTERED: none ? ?DRAINS: none  ? ?LOCAL MEDICATIONS USED:  none ? ?SPECIMEN:  No Specimen ? ?COUNTS:  YES ? ?TOURNIQUET:  * No tourniquets in log * ? ?DICTATION: .Other Dictation: Dictation Number [id number] ? ?PLAN OF CARE: Discharge to home after PACU ? ?PATIENT DISPOSITION:  PACU - hemodynamically stable ? ? ? ? ? ? ? ? ? ? ? ? ?  ?

## 2021-04-13 NOTE — Anesthesia Preprocedure Evaluation (Addendum)
Anesthesia Evaluation  ?Patient identified by MRN, date of birth, ID band ?Patient awake ? ? ? ?Reviewed: ?Allergy & Precautions, NPO status , Patient's Chart, lab work & pertinent test results ? ?Airway ?Mallampati: II ? ?TM Distance: >3 FB ?Neck ROM: Full ? ? ? Dental ?no notable dental hx. ?(+) Teeth Intact, Dental Advisory Given ?  ?Pulmonary ?neg pulmonary ROS, former smoker,  ?  ?Pulmonary exam normal ?breath sounds clear to auscultation ? ? ? ? ? ? Cardiovascular ?negative cardio ROS ?Normal cardiovascular exam ?Rhythm:Regular Rate:Normal ? ? ?  ?Neuro/Psych ?Anxiety Depression negative neurological ROS ? negative psych ROS  ? GI/Hepatic ?Neg liver ROS, GERD  ,  ?Endo/Other  ?negative endocrine ROS ? Renal/GU ?negative Renal ROS  ?negative genitourinary ?  ?Musculoskeletal ?negative musculoskeletal ROS ?(+)  ? Abdominal ?  ?Peds ?negative pediatric ROS ?(+)  Hematology ?negative hematology ROS ?(+)   ?Anesthesia Other Findings ? ? Reproductive/Obstetrics ?negative OB ROS ? ?  ? ? ? ? ? ? ? ? ? ? ? ? ? ?  ?  ? ? ? ? ? ? ? ?Anesthesia Physical ?Anesthesia Plan ? ?ASA: 2 ? ?Anesthesia Plan: General  ? ?Post-op Pain Management: Regional block* and Minimal or no pain anticipated  ? ?Induction: Intravenous ? ?PONV Risk Score and Plan: 2 and Ondansetron, Midazolam and Treatment may vary due to age or medical condition ? ?Airway Management Planned: Oral ETT ? ?Additional Equipment:  ? ?Intra-op Plan:  ? ?Post-operative Plan: Extubation in OR ? ?Informed Consent: I have reviewed the patients History and Physical, chart, labs and discussed the procedure including the risks, benefits and alternatives for the proposed anesthesia with the patient or authorized representative who has indicated his/her understanding and acceptance.  ? ? ? ?Dental advisory given ? ?Plan Discussed with: CRNA ? ?Anesthesia Plan Comments:   ? ? ? ? ? ? ?Anesthesia Quick Evaluation ? ?

## 2021-04-13 NOTE — H&P (Signed)
Dennis Villarreal is an 71 y.o. male.   ?Chief Complaint: Left shoulder pain ?HPI: Dennis Villarreal is a 71 year old patient with left shoulder pain.  Old notes are reviewed.  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  He reports pain in that left shoulder region which has been going on severely for 3 weeks but less severely for several months.  He has had 2 prior surgeries on the left shoulder.  Sounds like he had arthroscopic decompression in 2004 which did help.  Had a second surgery which sounded similar which did not help.  He has had physical therapy as well as 2 injections.  First injection helped him for several months.  Second injection helped him slightly less but only for 1 week.  Symptoms are not better when he puts his arm overhead.  Over the past 3 weeks he has had symptoms involving the arm below the elbow and radiating into the hand without numbness and tingling.  Pain does not wake him from sleep at night.  He takes ibuprofen for his symptoms.  He is leaving on 03/30/2021 for destination wedding and will be back in mid March.  Patient does report some increased biceps mediated pain on that left-hand side in the past several weeks ? ?Past Medical History:  ?Diagnosis Date  ? ALLERGIC RHINITIS 09/04/2009  ? Basal cell carcinoma   ? Cancer [facility]) 2012  ? melanoma  ? DYSLIPIDEMIA 09/04/2009  ? GERD 09/04/2009  ? High cholesterol   ? just above borderline per patient   ? ? ?Past Surgical History:  ?Procedure Laterality Date  ? GALLBLADDER SURGERY  01/2018  ? [place], 2009  ? left shoulder scromoplasty  ? TONSILLECTOMY    ? TRIGGER FINGER RELEASE Left   ? ? ?Family History  ?Problem Relation Age of Onset  ? Heart disease Mother   ?     CVA and cardiac arrhythmia, ? atrial fib  ? Stroke Mother   ? Hypertension Mother   ? Melanoma Father   ? Glaucoma Father   ? Glaucoma Brother   ? ?Social History:  reports that he quit smoking about 39 years ago. His smoking use  included cigarettes. He has a 20.00 pack-year smoking history. He has never used smokeless tobacco. He reports current alcohol use. He reports that he does not use drugs. ? ?Allergies:  ?Allergies  ?Allergen Reactions  ? Levofloxacin   ?  hives and GI upset  ? Naproxen Hives, Swelling and Other (See Comments)  ?  Can take ibuprofen  ? Oxycodone-Acetaminophen   ?  Upset stomach  ? ? ?Medications Prior to Admission  ?Medication Sig Dispense Refill  ? acetaminophen (TYLENOL) 500 MG tablet Take 500 mg by mouth every 6 (six) hours as needed (for pain.).    ? atorvastatin (LIPITOR) 20 MG tablet TAKE ONE TABLET BY MOUTH DAILY 90 tablet 3  ? gabapentin (NEURONTIN) 100 MG capsule Take one capsule at night for three days and then titrate up to two tablets at night if needed. (Patient taking differently: 200 mg at bedtime.) 60 capsule 3  ? HYDROcodone-acetaminophen (NORCO) 10-325 MG tablet Take 0.5 tablets by mouth every 8 (eight) hours as needed. 10 tablet 0  ? Magnesium 250 MG TABS Take 250 mg by mouth in the morning.    ? Multiple Vitamin (MULTIVITAMIN WITH MINERALS) TABS tablet Take 1 tablet by mouth daily. Centrum for Men 50+    ? pantoprazole (PROTONIX)  40 MG tablet Take 1 tablet (40 mg total) by mouth daily. (Patient taking differently: Take 40 mg by mouth 2 (two) times daily.) 90 tablet 2  ? psyllium (METAMUCIL) 58.6 % powder Take 1 packet by mouth daily.    ? tadalafil (CIALIS) 5 MG tablet TAKE ONE TABLET BY MOUTH DAILY (Patient taking differently: Take 5 mg by mouth daily as needed (prostate).) 30 tablet 1  ? tamsulosin (FLOMAX) 0.4 MG CAPS capsule TAKE ONE CAPSULE BY MOUTH DAILY 90 capsule 1  ? ibuprofen (ADVIL) 800 MG tablet Take 1 tablet (800 mg total) by mouth every 8 (eight) hours as needed. (Patient not taking: Reported on 04/08/2021) 90 tablet 0  ? ? ?No results found for this or any previous visit (from the past 48 hour(s)). ?No results found. ? ?Review of Systems  ?Musculoskeletal:  Positive for arthralgias.   ?All other systems reviewed and are negative. ? ?Blood pressure 126/70, pulse 61, temperature 98.2 ?F (36.8 ?C), temperature source Oral, resp. rate (!) 9, height '5\' 6"'$  (1.676 m), weight 63.5 kg, SpO2 93 %. ?Physical Exam ?Vitals reviewed.  ?HENT:  ?   Head: Normocephalic.  ?   Nose: Nose normal.  ?   Mouth/Throat:  ?   Mouth: Mucous membranes are moist.  ?Eyes:  ?   Pupils: Pupils are equal, round, and reactive to light.  ?Cardiovascular:  ?   Rate and Rhythm: Normal rate.  ?   Pulses: Normal pulses.  ?Pulmonary:  ?   Effort: Pulmonary effort is normal.  ?Abdominal:  ?   General: Abdomen is flat.  ?Musculoskeletal:  ?   Cervical back: Normal range of motion.  ?Skin: ?   General: Skin is warm.  ?   Capillary Refill: Capillary refill takes less than 2 seconds.  ?Neurological:  ?   General: No focal deficit present.  ?   Mental Status: He is alert.  ?Psychiatric:     ?   Mood and Affect: Mood normal.  ?  ?Ortho exam demonstrates good cervical spine range of motion 5 out of 5 grip EPL FPL interosseous wrist flexion extension bicep triceps and deltoid strength.  Not too much coarseness with internal and external rotation at 15 and 90 degrees of abduction on the left.  Does have some weakness to infraspinatus supraspinatus testing on the left at 5- out of 5 compared to 5+ out of 5 on the right.  Subscap strength full and symmetric.  No discrete AC joint tenderness to direct palpation.  No masses lymphadenopathy or skin changes noted in the shoulder region. ?Assessment/Plan ?Impression is left shoulder rotator cuff tear.  MRI is reviewed with the patient which does show about 95% tearing of the supraspinatus without retraction.  Evidence of prior acromioplasty noted.  Not too much in terms of glenohumeral joint arthritis or AC joint arthritis.  Plan is after failure of conservative management arthroscopic evaluation indicated with biceps tenodesis and rotator cuff repair.  In addition patient does have C7-T1  spondylolisthesis and is having some symptoms currently which could also be part of his constellation of pain generators.  Since we have some time prior to the wedding I think MRI C-spine indicated to evaluate possible early left-sided radiculopathy.  We will post him for left shoulder rotator cuff repair after he returns from his destination wedding.  Requested injection today but we will hold off on that due to slight increase in infection risk close to the time of surgery. ?  ?Anderson Malta, MD ?  04/13/2021, 10:52 AM ? ? ? ?

## 2021-04-13 NOTE — Anesthesia Procedure Notes (Addendum)
?  Anesthesia Regional Block: Interscalene brachial plexus block  ? ?Pre-Anesthetic Checklist: , timeout performed,  Correct Patient, Correct Site, Correct Laterality,  Correct Procedure, Correct Position, site marked,  Risks and benefits discussed,  Surgical consent,  Pre-op evaluation,  At surgeon's request and post-op pain management ? ?Laterality: Left ? ?Prep: chloraprep     ?  ?Needles:  ?Injection technique: Single-shot ? ?Needle Type: Stimiplex   ? ? ?Needle Length: 9cm  ?Needle Gauge: 21  ? ? ? ?Additional Needles: ? ? ?Procedures:,,,, ultrasound used (permanent image in chart),,    ?Narrative:  ?Start time: 04/13/2021 10:31 AM ?End time: 04/13/2021 10:36 AM ?Injection made incrementally with aspirations every 5 mL. ? ?Performed by: Personally  ?Anesthesiologist: Lynda Rainwater, MD ? ? ? ?

## 2021-04-14 ENCOUNTER — Encounter (HOSPITAL_COMMUNITY): Payer: Self-pay | Admitting: Orthopedic Surgery

## 2021-04-17 DIAGNOSIS — M75122 Complete rotator cuff tear or rupture of left shoulder, not specified as traumatic: Secondary | ICD-10-CM

## 2021-04-17 DIAGNOSIS — M7522 Bicipital tendinitis, left shoulder: Secondary | ICD-10-CM

## 2021-04-17 DIAGNOSIS — S43432A Superior glenoid labrum lesion of left shoulder, initial encounter: Secondary | ICD-10-CM

## 2021-04-19 ENCOUNTER — Telehealth: Payer: Self-pay | Admitting: Orthopedic Surgery

## 2021-04-19 ENCOUNTER — Other Ambulatory Visit: Payer: Self-pay | Admitting: Surgical

## 2021-04-19 MED ORDER — HYDROCODONE-ACETAMINOPHEN 10-325 MG PO TABS: 1.0000 | ORAL_TABLET | ORAL | 0 refills | Status: DC | PRN

## 2021-04-19 NOTE — Telephone Encounter (Signed)
Tried calling to advise done-no answer.  

## 2021-04-19 NOTE — Telephone Encounter (Signed)
refilled 

## 2021-04-19 NOTE — Telephone Encounter (Signed)
Pt called requesting a refill of hydrocodone. Please send to pharmacy on file. Phone number is [phone]. ?

## 2021-04-21 ENCOUNTER — Ambulatory Visit (INDEPENDENT_AMBULATORY_CARE_PROVIDER_SITE_OTHER): Payer: No Typology Code available for payment source | Admitting: Orthopedic Surgery

## 2021-04-21 ENCOUNTER — Encounter: Payer: Self-pay | Admitting: Orthopedic Surgery

## 2021-04-21 ENCOUNTER — Other Ambulatory Visit: Payer: Self-pay

## 2021-04-21 DIAGNOSIS — M792 Neuralgia and neuritis, unspecified: Secondary | ICD-10-CM

## 2021-04-21 DIAGNOSIS — Z9889 Other specified postprocedural states: Secondary | ICD-10-CM

## 2021-04-22 ENCOUNTER — Telehealth: Payer: Self-pay | Admitting: Orthopedic Surgery

## 2021-04-22 ENCOUNTER — Encounter: Payer: Self-pay | Admitting: Orthopedic Surgery

## 2021-04-22 NOTE — Telephone Encounter (Signed)
IC talked with patient at length. Dennis Villarreal will get hip set up with brace tomorrow.  ? ?

## 2021-04-22 NOTE — Progress Notes (Signed)
? ?Post-Op Visit Note ?  ?Patient: Dennis Villarreal           ?Date of Birth: 05-24-1950           ?MRN: [id number] ?Visit Date: 04/21/2021 ?PCP: [name], MD ? ? ?Assessment & Plan: ? ?Chief Complaint:  ?Chief Complaint  ?Patient presents with  ? Left Shoulder - Routine Post Op  ? ?Visit Diagnoses:  ?1. Radicular pain in left arm   ?2. History of arthroscopy of left shoulder   ?3. S/P rotator cuff repair   ? ? ?Plan: Dennis Villarreal is a 71 year old patient who is now a week out left shoulder arthroscopy rotator cuff tear repair.  Has not been doing the postop CPM machine.  On exam he has decreased external rotation to about 15 degrees.  Forward flexion abduction to about 45-50 passively.  Rotator cuff repair feels good.  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  Not really ready to do that type of work.  Incisions intact.  Plan at this time is that we really need to work on his passive range of motion.  At the time of this dictation we were going to have him start in therapy but instead he is going to try to use the accordion type brace at home to facilitate range of motion.  Come back in 2 weeks for clinical recheck. ? ?Follow-Up Instructions: No follow-ups on file.  ? ?Orders:  ?Orders Placed This Encounter  ?Procedures  ? Ambulatory referral to Physical Therapy  ? ?No orders of the defined types were placed in this encounter. ? ? ?Imaging: ?No results found. ? ?PMFS History: ?Patient Active Problem List  ? Diagnosis Date Noted  ? Complete tear of left rotator cuff   ? Biceps tendonitis on left   ? Degenerative superior labral anterior-to-posterior (SLAP) tear of left shoulder   ? BPH (benign prostatic hyperplasia) 05/24/2017  ? Diplopia 03/18/2014  ? Dizziness and giddiness 03/18/2014  ? Vision loss 03/18/2014  ? Sinusitis 02/03/2013  ? Anxiety and depression 09/13/2012  ? History of melanoma 06/30/2011  ? Hyperlipidemia 09/04/2009  ? ALLERGIC RHINITIS 09/04/2009  ? GERD 09/04/2009  ? ?Past Medical  History:  ?Diagnosis Date  ? ALLERGIC RHINITIS 09/04/2009  ? Basal cell carcinoma   ? Cancer [facility]) 2012  ? melanoma  ? DYSLIPIDEMIA 09/04/2009  ? GERD 09/04/2009  ? High cholesterol   ? just above borderline per patient   ?  ?Family History  ?Problem Relation Age of Onset  ? Heart disease Mother   ?     CVA and cardiac arrhythmia, ? atrial fib  ? Stroke Mother   ? Hypertension Mother   ? Melanoma Father   ? Glaucoma Father   ? Glaucoma Brother   ?  ?Past Surgical History:  ?Procedure Laterality Date  ? GALLBLADDER SURGERY  01/2018  ? SHOULDER ARTHROSCOPY WITH OPEN ROTATOR CUFF REPAIR AND DISTAL CLAVICLE ACROMINECTOMY Left 04/13/2021  ? Procedure: LEFT SHOULDER ARTHROSCOPY, DEBRIDEMENT, MINI OPEN ROTATOR CUFF TEAR REPAIR, BICEPS TENODESIS;  Surgeon: Meredith Pel, MD;  Location: [place];  Service: Orthopedics;  Laterality: Left;  ? [place], 2009  ? left shoulder scromoplasty  ? TONSILLECTOMY    ? TRIGGER FINGER RELEASE Left   ? ?Social History  ? ?Occupational History  ? Occupation: TEFL teacher    ?  Comment: Estate manager/land agent  ?Tobacco Use  ? Smoking status: Former  ?  Packs/day: 1.00  ?  Years:  20.00  ?  Pack years: 20.00  ?  Types: Cigarettes  ?  Quit date: 01/24/1982  ?  Years since quitting: 39.2  ? Smokeless tobacco: Never  ?Vaping Use  ? Vaping Use: Never used  ?Substance and Sexual Activity  ? Alcohol use: Yes  ?  Alcohol/week: 0.0 standard drinks  ?  Comment: socially  ? Drug use: No  ? Sexual activity: Yes  ? ? ? ?

## 2021-04-22 NOTE — Telephone Encounter (Signed)
Patient called asked for a call back concerning the medical equipment  (brace) he's trying to help Dr. Marlou Sa get through integrated.  Patient said he sent some information through mychart as well. ? ?The number to contact patient is [phone] ?

## 2021-04-23 ENCOUNTER — Telehealth: Payer: Self-pay | Admitting: Orthopedic Surgery

## 2021-04-23 DIAGNOSIS — Z9889 Other specified postprocedural states: Secondary | ICD-10-CM

## 2021-04-23 NOTE — Telephone Encounter (Signed)
Ok for PT here 3 x week 6 weeks for prom and isometric strengthening thx

## 2021-04-23 NOTE — Telephone Encounter (Signed)
I called patient and advised. 

## 2021-04-23 NOTE — Telephone Encounter (Signed)
Patient was contacted for the shoulder brace and refused. Patient says he would like to proceed with physical therapy.  ?

## 2021-04-23 NOTE — Telephone Encounter (Signed)
Please advise 

## 2021-04-23 NOTE — Telephone Encounter (Signed)
Referral entered. I called patient to advise. Voicemail picks up, but does not state who it belongs to, no message left.  Will try again. ?

## 2021-04-26 ENCOUNTER — Ambulatory Visit (INDEPENDENT_AMBULATORY_CARE_PROVIDER_SITE_OTHER): Payer: No Typology Code available for payment source | Admitting: Rehabilitative and Restorative Service Providers"

## 2021-04-26 ENCOUNTER — Encounter: Payer: Self-pay | Admitting: Rehabilitative and Restorative Service Providers"

## 2021-04-26 ENCOUNTER — Other Ambulatory Visit: Payer: Self-pay

## 2021-04-26 ENCOUNTER — Other Ambulatory Visit: Payer: Self-pay | Admitting: Surgical

## 2021-04-26 ENCOUNTER — Telehealth: Payer: Self-pay | Admitting: Orthopedic Surgery

## 2021-04-26 DIAGNOSIS — M25612 Stiffness of left shoulder, not elsewhere classified: Secondary | ICD-10-CM

## 2021-04-26 DIAGNOSIS — M6281 Muscle weakness (generalized): Secondary | ICD-10-CM

## 2021-04-26 DIAGNOSIS — M25512 Pain in left shoulder: Secondary | ICD-10-CM

## 2021-04-26 DIAGNOSIS — R600 Localized edema: Secondary | ICD-10-CM

## 2021-04-26 DIAGNOSIS — G8929 Other chronic pain: Secondary | ICD-10-CM | POA: Diagnosis not present

## 2021-04-26 MED ORDER — HYDROCODONE-ACETAMINOPHEN 5-325 MG PO TABS: 1.0000 | ORAL_TABLET | ORAL | 0 refills | Status: DC | PRN

## 2021-04-26 NOTE — Telephone Encounter (Signed)
Pt would like refill on hydrocodone  ?

## 2021-04-26 NOTE — Telephone Encounter (Signed)
Sent in

## 2021-04-26 NOTE — Therapy (Signed)
?OUTPATIENT PHYSICAL THERAPY SHOULDER EVALUATION ? ? ?Patient Name: Dennis Villarreal ?MRN: [id number] ?DOB:07/21/1950, 71 y.o., male ?Today's Date: 04/26/2021 ? ? PT End of Session - 04/26/21 1051   ? ? Visit Number 1   ? Number of Visits 20   ? Date for PT Re-Evaluation 07/05/21   ? Authorization Type [employer] $40 copay   ? Progress Note Due on Visit 10   ? PT Start Time 1055   ? PT Stop Time 1130   ? PT Time Calculation (min) 35 min   ? Activity Tolerance Patient tolerated treatment well   ? Behavior During Therapy [facility] for tasks assessed/performed   ? ?  ?  ? ?  ? ? ?Past Medical History:  ?Diagnosis Date  ? ALLERGIC RHINITIS 09/04/2009  ? Basal cell carcinoma   ? Cancer [facility]) 2012  ? melanoma  ? DYSLIPIDEMIA 09/04/2009  ? GERD 09/04/2009  ? High cholesterol   ? just above borderline per patient   ? ?Past Surgical History:  ?Procedure Laterality Date  ? GALLBLADDER SURGERY  01/2018  ? SHOULDER ARTHROSCOPY WITH OPEN ROTATOR CUFF REPAIR AND DISTAL CLAVICLE ACROMINECTOMY Left 04/13/2021  ? Procedure: LEFT SHOULDER ARTHROSCOPY, DEBRIDEMENT, MINI OPEN ROTATOR CUFF TEAR REPAIR, BICEPS TENODESIS;  Surgeon: Meredith Pel, MD;  Location: [place];  Service: Orthopedics;  Laterality: Left;  ? [place], 2009  ? left shoulder scromoplasty  ? TONSILLECTOMY    ? TRIGGER FINGER RELEASE Left   ? ?Patient Active Problem List  ? Diagnosis Date Noted  ? Complete tear of left rotator cuff   ? Biceps tendonitis on left   ? Degenerative superior labral anterior-to-posterior (SLAP) tear of left shoulder   ? BPH (benign prostatic hyperplasia) 05/24/2017  ? Diplopia 03/18/2014  ? Dizziness and giddiness 03/18/2014  ? Vision loss 03/18/2014  ? Sinusitis 02/03/2013  ? Anxiety and depression 09/13/2012  ? History of melanoma 06/30/2011  ? Hyperlipidemia 09/04/2009  ? ALLERGIC RHINITIS 09/04/2009  ? GERD 09/04/2009  ? ? ?PCP: [name], MD ? ?REFERRING PROVIDER: Meredith Pel, MD ? ?REFERRING DIAG: O83.254  (ICD-10-CM) - History of arthroscopy of left shoulder ?D82.641 (ICD-10-CM) - S/P rotator cuff repair ? ?THERAPY DIAG:  ?Chronic left shoulder pain ? ?Stiffness of left shoulder, not elsewhere classified ? ?Muscle weakness (generalized) ? ?Localized edema ? ? ?ONSET DATE: Surgery 04/13/2021 ? ?SUBJECTIVE:                                                                                                                                                                                     ? ?SUBJECTIVE STATEMENT: ?Pt indicated ongoing trouble with shoulder.  Pt indicated he had achy feeling in shoulder into Lt arm with difficulty moving arm.  Worsened in last 6 months. History of injections for shoulder.  Had surgery 04/13/2021 for rotator cuff repair. Arrived today in sling.  Pt stated sleeping in bed now.  ? ?PERTINENT HISTORY: ?History of melanoma ? ?PAIN:  ?Are you having pain? Yes: NPRS scale: current:6/10. pain at worst 8/10 ?Pain location: Lt shoulder  ?Pain description: achy, sore ?Aggravating factors: post surgery feeling,  ?Relieving factors: pain medicine ? ?PRECAUTIONS: Shoulder - PROM 6 weeks  ? ?FALLS:  ?Has patient fallen in last 6 months? No ? ?LIVING ENVIRONMENT: ?Lives with: lives with their family ?Lives in: House/apartment ? ? 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  Various lifting/carrying activity.  ? ?PLOF: Independent , yard care, Rt hand dominant ? ?PATIENT GOALS Reduce pain, get back to work and hobbies ? ?OBJECTIVE:  ? ?PATIENT SURVEYS:  ? 04/26/2021: FOTO intake:  36 predicted: 59  ? ?COGNITION: ?04/26/2021: Overall cognitive status: Within functional limits for tasks assessed ?    ?SENSATION: ? 04/26/2021 WFL ? ?Edema:  04/26/2021: localized edema Lt shoulder ? ?POSTURE: ?04/26/2021 Wearing sling  ? ?UPPER EXTREMITY ROM:  ? ?ROM Right ?04/26/2021 Left PROM ?04/26/2021  ?Shoulder flexion  95 PROM in supine, pain at end range  ?Shoulder extension     ?Shoulder abduction  90 PROM in supine, pain at end range  ?Shoulder adduction    ?Shoulder internal rotation  To belly   ?Shoulder external rotation  0 PROM in supine, pain at end range  ?Elbow flexion    ?Elbow extension    ?Wrist flexion    ?Wrist extension    ?Wrist ulnar deviation    ?Wrist radial deviation    ?Wrist pronation    ?Wrist supination    ?(Blank rows = not tested) ? ?UPPER EXTREMITY MMT: ? ?MMT Right ?04/26/2021 Left ?04/26/2021 ?Not tested due to surgery protocol  ?Shoulder flexion 5/5   ?Shoulder extension    ?Shoulder abduction 5/5   ?Shoulder adduction    ?Shoulder internal rotation 5/5   ?Shoulder external rotation 5/5   ?Middle trapezius    ?Lower trapezius    ?Elbow flexion 5/5   ?Elbow extension 5/5   ?Wrist flexion    ?Wrist extension    ?Wrist ulnar deviation    ?Wrist radial deviation    ?Wrist pronation    ?Wrist supination    ?Grip strength (lbs)    ?(Blank rows = not tested) ? ?JOINT MOBILITY TESTING:  ?04/26/2021 : early to mid range Lt Gh joint mobility assessment was normal for capsular mobility. Guarding was noted from musculature.  ? ?PALPATION:  ?04/26/2021 : mild tenderness surrounding Lt shoulder grossly.  ?  ?TODAY'S TREATMENT:  ?04/26/2021: ?  Therex: HEP instruction/performance c cues for techniques, handout provided.  Trial set performed of each for comprehension and symptom assessment.  See below for exercise list. ? ?  Manual: PROM Lt Gh joint, g2 inferior joint glides ? ? ?PATIENT EDUCATION: ?04/26/2021 ?Education details: HEP, POC, PROM education  ?Person educated: Patient ?Education method: Explanation, Demonstration, Verbal cues, and Handouts ?Education comprehension: verbalized understanding and returned demonstration ? ? ?HOME EXERCISE PROGRAM: ?04/26/2021  ?Access Code: [email or web address] ?URL: [email or web address] ?Date: 04/26/2021 ?Prepared by: Scot Jun ? ?Exercises ?- Circular Shoulder Pendulum with Table Support  - 2-3 x daily - 7 x weekly - 1-2 sets - 10  reps ?- Standing Circular Shoulder Pendulum Supported with  Arm Bent  - 2-3 x daily - 7 x weekly - 1-2 sets - 10 reps ?- Standing Flexion Extension Shoulder Pendulum Supported with Arm Bent  - 2-3 x daily - 7 x weekly - 1-2 sets - 10 reps ?- Standing Horizontal Shoulder Pendulum Supported with Arm Bent  - 2-3 x daily - 7 x weekly - 1-2 sets - 10 reps ?- Seated Scapular Retraction  - 3-5 x daily - 7 x weekly - 1 sets - 10 reps - 3-5 hold ? ? ?ASSESSMENT: ? ?CLINICAL IMPRESSION: ?Patient is a 71 y.o. who comes to clinic with complaints of Lt shoulder pain s/p recent rotator cuff repair surgery with mobility, strength and movement coordination deficits that impair their ability to perform usual daily and recreational functional activities without increase difficulty/symptoms at this time.  Patient to benefit from skilled PT services to address impairments and limitations to improve to previous level of function without restriction secondary to condition.  ? ? ?OBJECTIVE IMPAIRMENTS decreased activity tolerance, decreased coordination, decreased endurance, decreased mobility, decreased ROM, decreased strength, hypomobility, increased edema, impaired perceived functional ability, impaired UE functional use, improper body mechanics, postural dysfunction, and pain.  ? ?ACTIVITY LIMITATIONS cleaning, community activity, driving, meal prep, occupation, laundry, and yard work.  ? ?REHAB POTENTIAL: Good ? ?CLINICAL DECISION MAKING: Stable/uncomplicated ? ?EVALUATION COMPLEXITY: Low ? ? ?GOALS: ?Goals reviewed with patient? Yes ?Eval on 04/26/2021 ?Short term PT Goals (target date for Short term goals are 3 weeks 05/17/2021) ?Patient will demonstrate independent use of home exercise program to maintain progress from in clinic treatments. ?Goal status: New ?  ?Long term PT goals (target dates for all long term goals are 10 weeks  07/05/2021 ) ? ?1. Patient will demonstrate/report pain at worst less than or equal to 2/10 to facilitate  minimal limitation in daily activity secondary to pain symptoms. ?Goal status: New ? ?2. Patient will demonstrate independent use of home exercise program to facilitate ability to maintain/progress functi

## 2021-04-27 ENCOUNTER — Telehealth: Payer: Self-pay | Admitting: Orthopedic Surgery

## 2021-04-27 NOTE — Telephone Encounter (Signed)
Tried calling to advise done. No answer.  ?

## 2021-04-27 NOTE — Telephone Encounter (Signed)
Patient called needing a refill on Rx Hydrocodone.    The number to contact patient is [phone] ?

## 2021-04-27 NOTE — Telephone Encounter (Signed)
Dennis Villarreal sent this in yesterday. Tried calling patient this a.m. to advise. No answer ?

## 2021-04-28 ENCOUNTER — Encounter: Payer: Self-pay | Admitting: Physical Therapy

## 2021-04-28 ENCOUNTER — Ambulatory Visit: Payer: No Typology Code available for payment source | Admitting: Physical Therapy

## 2021-04-28 DIAGNOSIS — G8929 Other chronic pain: Secondary | ICD-10-CM | POA: Diagnosis not present

## 2021-04-28 DIAGNOSIS — R600 Localized edema: Secondary | ICD-10-CM

## 2021-04-28 DIAGNOSIS — M25612 Stiffness of left shoulder, not elsewhere classified: Secondary | ICD-10-CM

## 2021-04-28 DIAGNOSIS — M6281 Muscle weakness (generalized): Secondary | ICD-10-CM | POA: Diagnosis not present

## 2021-04-28 DIAGNOSIS — M25512 Pain in left shoulder: Secondary | ICD-10-CM | POA: Diagnosis not present

## 2021-04-28 NOTE — Therapy (Signed)
?OUTPATIENT PHYSICAL THERAPY TREATMENT NOTE ? ? ?Patient Name: Dennis Villarreal ?MRN: [id number] ?DOB:Dec 26, 1950, 71 y.o., male ?Today's Date: 04/28/2021 ? ?PCP: [name], MD ?REFERRING PROVIDER: Meredith Pel, MD ? ?END OF SESSION:  ? PT End of Session - 04/28/21 1130   ? ? Visit Number 2   ? Number of Visits 20   ? Date for PT Re-Evaluation 07/05/21   ? Authorization Type [employer] $40 copay   ? Progress Note Due on Visit 10   ? PT Start Time 1100   ? PT Stop Time 3300   ? PT Time Calculation (min) 38 min   ? Activity Tolerance Patient tolerated treatment well   ? Behavior During Therapy [facility] for tasks assessed/performed   ? ?  ?  ? ?  ? ? ?Past Medical History:  ?Diagnosis Date  ? ALLERGIC RHINITIS 09/04/2009  ? Basal cell carcinoma   ? Cancer [facility]) 2012  ? melanoma  ? DYSLIPIDEMIA 09/04/2009  ? GERD 09/04/2009  ? High cholesterol   ? just above borderline per patient   ? ?Past Surgical History:  ?Procedure Laterality Date  ? GALLBLADDER SURGERY  01/2018  ? SHOULDER ARTHROSCOPY WITH OPEN ROTATOR CUFF REPAIR AND DISTAL CLAVICLE ACROMINECTOMY Left 04/13/2021  ? Procedure: LEFT SHOULDER ARTHROSCOPY, DEBRIDEMENT, MINI OPEN ROTATOR CUFF TEAR REPAIR, BICEPS TENODESIS;  Surgeon: Meredith Pel, MD;  Location: [place];  Service: Orthopedics;  Laterality: Left;  ? [place], 2009  ? left shoulder scromoplasty  ? TONSILLECTOMY    ? TRIGGER FINGER RELEASE Left   ? ?Patient Active Problem List  ? Diagnosis Date Noted  ? Complete tear of left rotator cuff   ? Biceps tendonitis on left   ? Degenerative superior labral anterior-to-posterior (SLAP) tear of left shoulder   ? BPH (benign prostatic hyperplasia) 05/24/2017  ? Diplopia 03/18/2014  ? Dizziness and giddiness 03/18/2014  ? Vision loss 03/18/2014  ? Sinusitis 02/03/2013  ? Anxiety and depression 09/13/2012  ? History of melanoma 06/30/2011  ? Hyperlipidemia 09/04/2009  ? ALLERGIC RHINITIS 09/04/2009  ? GERD 09/04/2009  ? ? ?THERAPY DIAG:   ?Chronic left shoulder pain ? ?Stiffness of left shoulder, not elsewhere classified ? ?Muscle weakness (generalized) ? ?Localized edema ? ?  ?REFERRING DIAG: Z98.890 (ICD-10-CM) - History of arthroscopy of left shoulder ?T62.263 (ICD-10-CM) - S/P rotator cuff repair ?  ?  ?ONSET DATE: Surgery 04/13/2021 ?  ?SUBJECTIVE:                                                                                                                                                                                     ?  ?  SUBJECTIVE STATEMENT: Relays he has been doing his HEP, his shoulder does not feel too bad today ? ?  ?PERTINENT HISTORY: ?Had surgery 04/13/2021 for rotator cuff repair. History of melanoma ?  ?PAIN:  ?Are you having pain? Yes: NPRS scale: current:4/10. ?Pain location: Lt shoulder  ?Pain description: achy, sore ?Aggravating factors: post surgery feeling,  ?Relieving factors: pain medicine ?  ?PRECAUTIONS: Shoulder - PROM 6 weeks  ?  ?  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  Various lifting/carrying activity.  ?  ?PLOF: Independent , yard care, Rt hand dominant ?  ?PATIENT GOALS Reduce pain, get back to work and hobbies ?  ?OBJECTIVE:  ?  ?PATIENT SURVEYS:  ? 04/26/2021: FOTO intake:  36 predicted: 59  ?  ?COGNITION: ?04/26/2021: Overall cognitive status: Within functional limits for tasks assessed ?                                    ?SENSATION: ? 04/26/2021 WFL ?  ?Edema:  04/26/2021: localized edema Lt shoulder ?  ?POSTURE: ?04/26/2021 Wearing sling  ?  ?UPPER EXTREMITY ROM:  ?  ?ROM Right ?04/26/2021 Left PROM ?04/26/2021  ?Shoulder flexion   95 PROM in supine, pain at end range  ?Shoulder extension      ?Shoulder abduction   90 PROM in supine, pain at end range  ?Shoulder adduction      ?Shoulder internal rotation   To belly   ?Shoulder external rotation   0 PROM in supine, pain at end range  ?Elbow flexion      ?Elbow extension      ?Wrist flexion      ?Wrist  extension      ?Wrist ulnar deviation      ?Wrist radial deviation      ?Wrist pronation      ?Wrist supination      ?(Blank rows = not tested) ?  ?UPPER EXTREMITY MMT: ?  ?MMT Right ?04/26/2021 Left ?04/26/2021 ?Not tested due to surgery protocol  ?Shoulder flexion 5/5    ?Shoulder extension      ?Shoulder abduction 5/5    ?Shoulder adduction      ?Shoulder internal rotation 5/5    ?Shoulder external rotation 5/5    ?Middle trapezius      ?Lower trapezius      ?Elbow flexion 5/5    ?Elbow extension 5/5    ?Wrist flexion      ?Wrist extension      ?Wrist ulnar deviation      ?Wrist radial deviation      ?Wrist pronation      ?Wrist supination      ?Grip strength (lbs)      ?(Blank rows = not tested) ?  ?JOINT MOBILITY TESTING:  ?04/26/2021 : early to mid range Lt Gh joint mobility assessment was normal for capsular mobility. Guarding was noted from musculature.  ?  ?PALPATION:  ?04/26/2021 : mild tenderness surrounding Lt shoulder grossly.  ?             ?TODAY'S TREATMENT:  ?04/28/21 ?Standing pendulums X15 each for CW,CCW,A-P,lateral ?Standing Left elbow flexion/extension AROM X15 ?Seated scapular retractions with hands supported in lap 5 sec X15 ?Seated tableslide PROM left shoulder flexion, scaption, ER X10 each ?Manual PROM left shoulder gentle to tolerance but guarding noted especially with flexion ? ?Vasopnuematic X 10 min to Lt shoulder lowest  temp, medium compression ? ? ?04/26/2021: ?            Therex:            HEP instruction/performance c cues for techniques, handout provided.  Trial set performed of each for comprehension and symptom assessment.  See below for exercise list. ?  ?            Manual:           PROM Lt Gh joint, g2 inferior joint glides ?  ?  ?PATIENT EDUCATION: ?04/26/2021 ?Education details: HEP, POC, PROM education  ?Person educated: Patient ?Education method: Explanation, Demonstration, Verbal cues, and Handouts ?Education comprehension: verbalized understanding and returned demonstration ?  ?   ?HOME EXERCISE PROGRAM: ?Access Code: [email or web address] ?URL: [email or web address] ?Date: 04/28/2021 ?Prepared by: Elsie Ra ? ?Exercises ?- Circular Shoulder Pendulum with Table Support  - 2-3 x daily - 7 x weekly - 1-2 sets - 10 reps ?- Standing Circular Shoulder Pendulum Supported with Arm Bent  - 2-3 x daily - 7 x weekly - 1-2 sets - 10 reps ?- Standing Flexion Extension Shoulder Pendulum Supported with Arm Bent  - 2-3 x daily - 7 x weekly - 1-2 sets - 10 reps ?- Standing Horizontal Shoulder Pendulum Supported with Arm Bent  - 2-3 x daily - 7 x weekly - 1-2 sets - 10 reps ?- Seated Scapular Retraction  - 3-5 x daily - 7 x weekly - 1 sets - 10 reps - 3-5 hold ?- Seated Shoulder Flexion Towel Slide at Table Top  - 2-3 x daily - 6 x weekly - 1-2 sets - 10 reps ?- Seated Shoulder Abduction Towel Slide at Table Top  - 2-3 x daily - 6 x weekly - 1-2 sets - 10 reps - 5 hold ?- Seated Shoulder External Rotation PROM on Table  - 2-3 x daily - 6 x weekly - 1-2 sets - 10 reps - 5 hold ?  ?  ?ASSESSMENT: ?  ?CLINICAL IMPRESSION: ?He is doing reasonably well with pain post op and has good compliance with HEP. He does have significant muscle guarding with manual PROM from PT especially flexion so he was able to stretch better with self PROM seated table stretches. I added these into his HEP and printed this for him.  ?  ?  ?OBJECTIVE IMPAIRMENTS decreased activity tolerance, decreased coordination, decreased endurance, decreased mobility, decreased ROM, decreased strength, hypomobility, increased edema, impaired perceived functional ability, impaired UE functional use, improper body mechanics, postural dysfunction, and pain.  ?  ?ACTIVITY LIMITATIONS cleaning, community activity, driving, meal prep, occupation, laundry, and yard work.  ?  ?REHAB POTENTIAL: Good ?  ?CLINICAL DECISION MAKING: Stable/uncomplicated ?  ?EVALUATION COMPLEXITY: Low ?  ?  ?GOALS: ?Goals reviewed with patient? Yes ?Eval on 04/26/2021 ?Short  term PT Goals (target date for Short term goals are 3 weeks 05/17/2021) ?Patient will demonstrate independent use of home exercise program to maintain progress from in clinic treatments. ?Goal status: New ?

## 2021-05-03 ENCOUNTER — Ambulatory Visit: Payer: No Typology Code available for payment source | Admitting: Physical Therapy

## 2021-05-03 ENCOUNTER — Encounter: Payer: Self-pay | Admitting: Physical Therapy

## 2021-05-03 DIAGNOSIS — M25512 Pain in left shoulder: Secondary | ICD-10-CM

## 2021-05-03 DIAGNOSIS — M6281 Muscle weakness (generalized): Secondary | ICD-10-CM | POA: Diagnosis not present

## 2021-05-03 DIAGNOSIS — G8929 Other chronic pain: Secondary | ICD-10-CM

## 2021-05-03 DIAGNOSIS — R600 Localized edema: Secondary | ICD-10-CM

## 2021-05-03 DIAGNOSIS — M25612 Stiffness of left shoulder, not elsewhere classified: Secondary | ICD-10-CM | POA: Diagnosis not present

## 2021-05-03 NOTE — Therapy (Signed)
?OUTPATIENT PHYSICAL THERAPY TREATMENT NOTE ? ? ?Patient Name: Dennis Villarreal ?MRN: [id number] ?DOB:14-Aug-1950, 71 y.o., male ?Today's Date: 05/03/2021 ? ?PCP: [name], MD ?REFERRING PROVIDER: Meredith Pel, MD ? ?END OF SESSION:  ? PT End of Session - 05/03/21 1056   ? ? Visit Number 3   ? Number of Visits 20   ? Date for PT Re-Evaluation 07/05/21   ? Authorization Type [employer] $40 copay   ? Progress Note Due on Visit 10   ? PT Start Time 1055   ? PT Stop Time 1135   ? PT Time Calculation (min) 40 min   ? Activity Tolerance Patient tolerated treatment well   ? Behavior During Therapy [facility] for tasks assessed/performed   ? ?  ?  ? ?  ? ? ?Past Medical History:  ?Diagnosis Date  ? ALLERGIC RHINITIS 09/04/2009  ? Basal cell carcinoma   ? [facility]) 2012  ? melanoma  ? DYSLIPIDEMIA 09/04/2009  ? GERD 09/04/2009  ? High cholesterol   ? just above borderline per patient   ? ?Past Surgical History:  ?Procedure Laterality Date  ? GALLBLADDER SURGERY  01/2018  ? SHOULDER ARTHROSCOPY WITH OPEN ROTATOR CUFF REPAIR AND DISTAL CLAVICLE ACROMINECTOMY Left 04/13/2021  ? Procedure: LEFT SHOULDER ARTHROSCOPY, DEBRIDEMENT, MINI OPEN ROTATOR CUFF TEAR REPAIR, BICEPS TENODESIS;  Surgeon: Meredith Pel, MD;  Location: [place];  Service: Orthopedics;  Laterality: Left;  ? [place], 2009  ? left shoulder scromoplasty  ? TONSILLECTOMY    ? TRIGGER FINGER RELEASE Left   ? ?Patient Active Problem List  ? Diagnosis Date Noted  ? Complete tear of left rotator cuff   ? Biceps tendonitis on left   ? Degenerative superior labral anterior-to-posterior (SLAP) tear of left shoulder   ? BPH (benign prostatic hyperplasia) 05/24/2017  ? Diplopia 03/18/2014  ? Dizziness and giddiness 03/18/2014  ? Vision loss 03/18/2014  ? Sinusitis 02/03/2013  ? Anxiety and depression 09/13/2012  ? History of melanoma 06/30/2011  ? Hyperlipidemia 09/04/2009  ? ALLERGIC RHINITIS 09/04/2009  ? GERD 09/04/2009  ? ? ?THERAPY DIAG:   ?Chronic left shoulder pain ? ?Stiffness of left shoulder, not elsewhere classified ? ?Muscle weakness (generalized) ? ?Localized edema ? ?  ?REFERRING DIAG: Z98.890 (ICD-10-CM) - History of arthroscopy of left shoulder ?U54.270 (ICD-10-CM) - S/P rotator cuff repair ?  ?  ?ONSET DATE: Surgery 04/13/2021 ?  ?SUBJECTIVE:                                                                                                                                                                                     ?  ?  SUBJECTIVE STATEMENT: Relays he has more stiffness and soreness first thing in the morning but then it gets a little better. ?  ?PERTINENT HISTORY: ?Had surgery 04/13/2021 for rotator cuff repair. History of melanoma ?  ?PAIN:  ?Are you having pain? Yes: NPRS scale: current:4/10. ?Pain location: Lt shoulder  ?Pain description: achy, sore ?Aggravating factors: post surgery feeling,  ?Relieving factors: pain medicine ?  ?PRECAUTIONS: Shoulder - PROM 6 weeks  ?  ?  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  Various lifting/carrying activity.  ?  ?PLOF: Independent , yard care, Rt hand dominant ?  ?PATIENT GOALS Reduce pain, get back to work and hobbies ?  ?OBJECTIVE:  ?  ?PATIENT SURVEYS:  ? 04/26/2021: FOTO intake:  36 predicted: 59  ?  ?COGNITION: ?04/26/2021: Overall cognitive status: Within functional limits for tasks assessed ?                                    ?SENSATION: ? 04/26/2021 WFL ?  ?Edema:  04/26/2021: localized edema Lt shoulder ?  ?POSTURE: ?04/26/2021 Wearing sling  ?  ?UPPER EXTREMITY ROM:  ?  ?ROM Right ?04/26/2021 Left PROM ?04/26/2021 Left PROM  ?in supine ?05/03/21  ?Shoulder flexion   95 PROM in supine, pain at end range 100 (muscle guarding and pain noted)  ?Shoulder extension       ?Shoulder abduction   90 PROM in supine, pain at end range 100  ?Shoulder adduction       ?Shoulder internal rotation   To belly  To belly  ?Shoulder external rotation    0 PROM in supine, pain at end range 10  ?Elbow flexion       ?Elbow extension       ?Wrist flexion       ?Wrist extension       ?Wrist ulnar deviation       ?Wrist radial deviation       ?Wrist pronation       ?Wrist supination       ?(Blank rows = not tested) ?  ?UPPER EXTREMITY MMT: ?  ?MMT Right ?04/26/2021 Left ?04/26/2021 ?Not tested due to surgery protocol  ?Shoulder flexion 5/5    ?Shoulder extension      ?Shoulder abduction 5/5    ?Shoulder adduction      ?Shoulder internal rotation 5/5    ?Shoulder external rotation 5/5    ?Middle trapezius      ?Lower trapezius      ?Elbow flexion 5/5    ?Elbow extension 5/5    ?Wrist flexion      ?Wrist extension      ?Wrist ulnar deviation      ?Wrist radial deviation      ?Wrist pronation      ?Wrist supination      ?Grip strength (lbs)      ?(Blank rows = not tested) ?  ?JOINT MOBILITY TESTING:  ?04/26/2021 : early to mid range Lt Gh joint mobility assessment was normal for capsular mobility. Guarding was noted from musculature.  ?  ?PALPATION:  ?04/26/2021 : mild tenderness surrounding Lt shoulder grossly.  ?             ?TODAY'S TREATMENT:  ? ?05/03/21 ?Standing pendulums X15 each for CW,CCW,A-P,lateral ?Standing Left elbow flexion/extension AROM X15 with cues not to move shoulder ?Seated scapular retractions with hands supported in  lap 5 sec X15 ?Seated gripping for Left hand blue digigrip 2X20 ?Seated tableslide PROM left shoulder flexion, scaption, ER X15 each holding 5 sec ?Manual PROM left shoulder gentle to tolerance but guarding with flexion ? ?Vasopnuematic X 10 min to Lt shoulder lowest temp, medium compression ? ?04/28/21 ?Standing pendulums X15 each for CW,CCW,A-P,lateral ?Standing Left elbow flexion/extension AROM X15 ?Seated scapular retractions with hands supported in lap 5 sec X15 ?Seated tableslide PROM left shoulder flexion, scaption, ER X10 each ?Manual PROM left shoulder gentle to tolerance but guarding noted especially with flexion ? ?Vasopnuematic X 10 min  to Lt shoulder lowest temp, medium compression ? ?  ?  ?PATIENT EDUCATION: ?04/26/2021 ?Education details: HEP, POC, PROM education  ?Person educated: Patient ?Education method: Explanation, Demonstration, Verbal cues, and Handouts ?Education comprehension: verbalized understanding and returned demonstration ?  ?  ?HOME EXERCISE PROGRAM: ?Access Code: [email or web address] ?URL: [email or web address] ?Date: 04/28/2021 ?Prepared by: Elsie Ra ? ?Exercises ?- Circular Shoulder Pendulum with Table Support  - 2-3 x daily - 7 x weekly - 1-2 sets - 10 reps ?- Standing Circular Shoulder Pendulum Supported with Arm Bent  - 2-3 x daily - 7 x weekly - 1-2 sets - 10 reps ?- Standing Flexion Extension Shoulder Pendulum Supported with Arm Bent  - 2-3 x daily - 7 x weekly - 1-2 sets - 10 reps ?- Standing Horizontal Shoulder Pendulum Supported with Arm Bent  - 2-3 x daily - 7 x weekly - 1-2 sets - 10 reps ?- Seated Scapular Retraction  - 3-5 x daily - 7 x weekly - 1 sets - 10 reps - 3-5 hold ?- Seated Shoulder Flexion Towel Slide at Table Top  - 2-3 x daily - 6 x weekly - 1-2 sets - 10 reps ?- Seated Shoulder Abduction Towel Slide at Table Top  - 2-3 x daily - 6 x weekly - 1-2 sets - 10 reps - 5 hold ?- Seated Shoulder External Rotation PROM on Table  - 2-3 x daily - 6 x weekly - 1-2 sets - 10 reps - 5 hold ?  ?  ?ASSESSMENT: ?  ?CLINICAL IMPRESSION: ?He had better tolerance to PROM today with less muscle guarding with all planes except flexion. He has a lot of difficulty relaxing for flexion PROM. We again used vaso post treatment as this helps reduce pain and soreness. His PROM is slowly improving, PT recommending to continue current plan of care with PROM only to his Lt shoulder until at least 6 weeks on 05/25/21 ?  ?  ?OBJECTIVE IMPAIRMENTS decreased activity tolerance, decreased coordination, decreased endurance, decreased mobility, decreased ROM, decreased strength, hypomobility, increased edema, impaired perceived  functional ability, impaired UE functional use, improper body mechanics, postural dysfunction, and pain.  ?  ?ACTIVITY LIMITATIONS cleaning, community activity, driving, meal prep, occupation, laundry, and ya

## 2021-05-05 ENCOUNTER — Encounter: Payer: Self-pay | Admitting: Orthopedic Surgery

## 2021-05-05 ENCOUNTER — Ambulatory Visit (INDEPENDENT_AMBULATORY_CARE_PROVIDER_SITE_OTHER): Payer: No Typology Code available for payment source | Admitting: Orthopedic Surgery

## 2021-05-05 DIAGNOSIS — Z9889 Other specified postprocedural states: Secondary | ICD-10-CM

## 2021-05-05 MED ORDER — HYDROCODONE-ACETAMINOPHEN 5-325 MG PO TABS: 1.0000 | ORAL_TABLET | Freq: Three times a day (TID) | ORAL | 0 refills | Status: DC | PRN

## 2021-05-05 NOTE — Progress Notes (Signed)
? ?Post-Op Visit Note ?  ?Patient: Dennis Villarreal           ?Date of Birth: 12/25/1950           ?MRN: [id number] ?Visit Date: 05/05/2021 ?PCP: [name], MD ? ? ?Assessment & Plan: ? ?Chief Complaint:  ?Chief Complaint  ?Patient presents with  ? Left Shoulder - Routine Post Op  ?   04/13/21 (3w 1d) Left Shoulder Arthroscopy, Debridement, Mini Open Rotator Cuff Tear Repair, Biceps Tenodesis - Left ? ?  ? ?Visit Diagnoses:  ?1. History of arthroscopy of left shoulder   ? ? ?Plan: Patient is a 71 year old male who presents s/p left shoulder arthroscopy with mini open rotator cuff tear repair and biceps tenodesis on 04/13/2021.  He is 3 weeks out from surgery.  He states that he is doing well overall and notes occasional clavicular and mid humerus pain but overall pain is well controlled.  He is taking hydrocodone on 5 mg 2-3 times per day for pain control.  He is doing physical therapy 2 times per week with home exercise program.  Occasional popping sensation in the posterior aspect of the shoulder.  Still using the sling.  Not lifting anything with the arm.  No full active range of motion of the arm yet. ? ?On exam he has 15 degrees external rotation, 90 degrees abduction, 95 degrees forward flexion.  Incisions look to be healing well without evidence of infection or dehiscence.  Axillary nerve intact with deltoid firing.  Intact EPL, FPL, finger abduction, finger adduction, bicep flexion, tricep extension. ? ?Plan is continue with physical therapy.  Okay to start active assisted range of motion of the left shoulder and okay to start full active range of motion of the left shoulder with rotator cuff strengthening exercises about 6 weeks out from surgery on 05/25/2021.  He will follow-up with Dr. Marlou Sa in 4 weeks for clinical recheck.  Encouraged him to call the office in the meantime with any concerns.  He has made an exceptional improvement in his range of motion since last visit. ? ?Follow-Up Instructions: No  follow-ups on file.  ? ?Orders:  ?No orders of the defined types were placed in this encounter. ? ?No orders of the defined types were placed in this encounter. ? ? ?Imaging: ?No results found. ? ?PMFS History: ?Patient Active Problem List  ? Diagnosis Date Noted  ? Complete tear of left rotator cuff   ? Biceps tendonitis on left   ? Degenerative superior labral anterior-to-posterior (SLAP) tear of left shoulder   ? BPH (benign prostatic hyperplasia) 05/24/2017  ? Diplopia 03/18/2014  ? Dizziness and giddiness 03/18/2014  ? Vision loss 03/18/2014  ? Sinusitis 02/03/2013  ? Anxiety and depression 09/13/2012  ? History of melanoma 06/30/2011  ? Hyperlipidemia 09/04/2009  ? ALLERGIC RHINITIS 09/04/2009  ? GERD 09/04/2009  ? ?Past Medical History:  ?Diagnosis Date  ? ALLERGIC RHINITIS 09/04/2009  ? Basal cell carcinoma   ? Cancer [facility]) 2012  ? melanoma  ? DYSLIPIDEMIA 09/04/2009  ? GERD 09/04/2009  ? High cholesterol   ? just above borderline per patient   ?  ?Family History  ?Problem Relation Age of Onset  ? Heart disease Mother   ?     CVA and cardiac arrhythmia, ? atrial fib  ? Stroke Mother   ? Hypertension Mother   ? Melanoma Father   ? Glaucoma Father   ? Glaucoma Brother   ?  ?Past Surgical History:  ?  Procedure Laterality Date  ? GALLBLADDER SURGERY  01/2018  ? SHOULDER ARTHROSCOPY WITH OPEN ROTATOR CUFF REPAIR AND DISTAL CLAVICLE ACROMINECTOMY Left 04/13/2021  ? Procedure: LEFT SHOULDER ARTHROSCOPY, DEBRIDEMENT, MINI OPEN ROTATOR CUFF TEAR REPAIR, BICEPS TENODESIS;  Surgeon: Meredith Pel, MD;  Location: [place];  Service: Orthopedics;  Laterality: Left;  ? [place], 2009  ? left shoulder scromoplasty  ? TONSILLECTOMY    ? TRIGGER FINGER RELEASE Left   ? ?Social History  ? ?Occupational History  ? Occupation: TEFL teacher    ?  Comment: Estate manager/land agent  ?Tobacco Use  ? Smoking status: Former  ?  Packs/day: 1.00  ?  Years: 20.00  ?  Pack years: 20.00  ?  Types: Cigarettes  ?  Quit  date: 01/24/1982  ?  Years since quitting: 39.3  ? Smokeless tobacco: Never  ?Vaping Use  ? Vaping Use: Never used  ?Substance and Sexual Activity  ? Alcohol use: Yes  ?  Alcohol/week: 0.0 standard drinks  ?  Comment: socially  ? Drug use: No  ? Sexual activity: Yes  ? ? ? ?

## 2021-05-07 ENCOUNTER — Encounter: Payer: No Typology Code available for payment source | Admitting: Rehabilitative and Restorative Service Providers"

## 2021-05-10 ENCOUNTER — Encounter: Payer: Self-pay | Admitting: Rehabilitative and Restorative Service Providers"

## 2021-05-10 ENCOUNTER — Other Ambulatory Visit: Payer: Self-pay

## 2021-05-10 ENCOUNTER — Ambulatory Visit: Payer: No Typology Code available for payment source | Admitting: Rehabilitative and Restorative Service Providers"

## 2021-05-10 DIAGNOSIS — R600 Localized edema: Secondary | ICD-10-CM

## 2021-05-10 DIAGNOSIS — G8929 Other chronic pain: Secondary | ICD-10-CM

## 2021-05-10 DIAGNOSIS — R293 Abnormal posture: Secondary | ICD-10-CM | POA: Diagnosis not present

## 2021-05-10 DIAGNOSIS — M25512 Pain in left shoulder: Secondary | ICD-10-CM | POA: Diagnosis not present

## 2021-05-10 DIAGNOSIS — M6281 Muscle weakness (generalized): Secondary | ICD-10-CM

## 2021-05-10 DIAGNOSIS — M25612 Stiffness of left shoulder, not elsewhere classified: Secondary | ICD-10-CM | POA: Diagnosis not present

## 2021-05-10 MED ORDER — ATORVASTATIN CALCIUM 20 MG PO TABS: ORAL_TABLET | ORAL | 2 refills | Status: DC

## 2021-05-10 NOTE — Therapy (Signed)
?OUTPATIENT PHYSICAL THERAPY TREATMENT NOTE ? ? ?Patient Name: Dennis Villarreal ?MRN: [id number] ?DOB:1950-03-31, 71 y.o., male ?Today's Date: 05/10/2021 ? ?PCP: [name], MD ?REFERRING PROVIDER: Meredith Pel, MD ? ?END OF SESSION:  ? PT End of Session - 05/10/21 1025   ? ? Visit Number 4   ? Number of Visits 20   ? Date for PT Re-Evaluation 07/05/21   ? Authorization Type [employer] $40 copay   ? Progress Note Due on Visit 10   ? PT Start Time 1013   ? PT Stop Time 4854   ? PT Time Calculation (min) 40 min   ? Activity Tolerance Patient tolerated treatment well   ? Behavior During Therapy [facility] for tasks assessed/performed   ? ?  ?  ? ?  ? ? ? ?Past Medical History:  ?Diagnosis Date  ? ALLERGIC RHINITIS 09/04/2009  ? Basal cell carcinoma   ? Cancer [facility]) 2012  ? melanoma  ? DYSLIPIDEMIA 09/04/2009  ? GERD 09/04/2009  ? High cholesterol   ? just above borderline per patient   ? ?Past Surgical History:  ?Procedure Laterality Date  ? GALLBLADDER SURGERY  01/2018  ? SHOULDER ARTHROSCOPY WITH OPEN ROTATOR CUFF REPAIR AND DISTAL CLAVICLE ACROMINECTOMY Left 04/13/2021  ? Procedure: LEFT SHOULDER ARTHROSCOPY, DEBRIDEMENT, MINI OPEN ROTATOR CUFF TEAR REPAIR, BICEPS TENODESIS;  Surgeon: Meredith Pel, MD;  Location: [place];  Service: Orthopedics;  Laterality: Left;  ? [place], 2009  ? left shoulder scromoplasty  ? TONSILLECTOMY    ? TRIGGER FINGER RELEASE Left   ? ?Patient Active Problem List  ? Diagnosis Date Noted  ? Complete tear of left rotator cuff   ? Biceps tendonitis on left   ? Degenerative superior labral anterior-to-posterior (SLAP) tear of left shoulder   ? BPH (benign prostatic hyperplasia) 05/24/2017  ? Diplopia 03/18/2014  ? Dizziness and giddiness 03/18/2014  ? Vision loss 03/18/2014  ? Sinusitis 02/03/2013  ? Anxiety and depression 09/13/2012  ? History of melanoma 06/30/2011  ? Hyperlipidemia 09/04/2009  ? ALLERGIC RHINITIS 09/04/2009  ? GERD 09/04/2009  ? ? ?THERAPY  DIAG:  ?Chronic left shoulder pain ? ?Stiffness of left shoulder, not elsewhere classified ? ?Muscle weakness (generalized) ? ?Localized edema ? ?Abnormal posture ? ?  ?REFERRING DIAG: Z98.890 (ICD-10-CM) - History of arthroscopy of left shoulder ?O27.035 (ICD-10-CM) - S/P rotator cuff repair ?  ?  ?ONSET DATE: Surgery 04/13/2021 ?  ?SUBJECTIVE:                                                                                                                                                                                     ?  ?  SUBJECTIVE STATEMENT:  Pt indicated he has noticed popping in shoulder during movements.  Ache most noted in morning.  ?  ?PERTINENT HISTORY: ?Had surgery 04/13/2021 for rotator cuff repair. History of melanoma ?  ?PAIN:  ?Are you having pain? Yes: NPRS scale: current:4/10. ?Pain location: Lt shoulder  ?Pain description: achy, dull ?Aggravating factors: morning complaints, sometimes after exercise ?Relieving factors: pain medicine ?  ?PRECAUTIONS: AROM progression.  No strengthening with resistance until 05/25/2021. (Updated from MD office visit on 05/05/2021 ?  ?  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  ?  ?PLOF: Independent , yard care, Rt hand dominant ?  ?PATIENT GOALS Reduce pain, get back to work and hobbies ?  ?OBJECTIVE:  ?  ?PATIENT SURVEYS:  ? 04/26/2021: FOTO intake:  36 predicted: 59  ?  ?COGNITION: ?04/26/2021: Overall cognitive status: Within functional limits for tasks assessed ?                                    ?SENSATION: ? 04/26/2021 WFL ?  ?Edema:  04/26/2021: localized edema Lt shoulder ?  ?POSTURE: ?04/26/2021 Wearing sling  ?  ?UPPER EXTREMITY ROM:  ?  ?ROM Right ?04/26/2021 Left PROM ?04/26/2021 Left PROM  ?in supine ?05/03/21 Left PROM ?05/10/2021 ?  ?Shoulder flexion   95 PROM in supine, pain at end range 100 (muscle guarding and pain noted) 110 deg in supine  ?Shoulder extension         ?Shoulder abduction   90 PROM in supine, pain at end range 100   ?Shoulder adduction        ?Shoulder internal rotation   To belly  To belly   ?Shoulder external rotation   0 PROM in supine, pain at end range 10 PROM in 45 deg abduction ?32 deg  ?Elbow flexion        ?Elbow extension        ?Wrist flexion        ?Wrist extension        ?Wrist ulnar deviation        ?Wrist radial deviation        ?Wrist pronation        ?Wrist supination        ?(Blank rows = not tested) ?  ?UPPER EXTREMITY MMT: ?  ?MMT Right ?04/26/2021 Left ?04/26/2021 ?Not tested due to surgery protocol Left ?05/10/2021  ?Shoulder flexion 5/5   1/5  ?Shoulder extension       ?Shoulder abduction 5/5   1/5  ?Shoulder adduction       ?Shoulder internal rotation 5/5     ?Shoulder external rotation 5/5     ?Middle trapezius       ?Lower trapezius       ?Elbow flexion 5/5     ?Elbow extension 5/5     ?Wrist flexion       ?Wrist extension       ?Wrist ulnar deviation       ?Wrist radial deviation       ?Wrist pronation       ?Wrist supination       ?Grip strength (lbs)       ?(Blank rows = not tested) ?  ?JOINT MOBILITY TESTING:  ?04/26/2021 : early to mid range Lt Gh joint mobility assessment was normal for capsular mobility. Guarding was noted from musculature.  ?  ?  PALPATION:  ?04/26/2021 : mild tenderness surrounding Lt shoulder grossly.  ?             ?TODAY'S TREATMENT:  ?05/10/2021: ?  Therex: ?   Pulleys 3 mins flexion ?   Pulleys 3 mins scaption ?   Supine AAROM 1 lb bar flexion 2 x 10 to tolerance ?   Supine wand ER stretch 5 sec hold x 15 ?   Seated scapular 5 sec hold x 10  ?   ?  Manual: ?   G3 inferior Lt GH joint mobs, Mobilization c movement posterior glide to Lt [place] jt c PROM for ER.  PROM all directions. ? ? ?05/03/21 ?Standing pendulums X15 each for CW,CCW,A-P,lateral ?Standing Left elbow flexion/extension AROM X15 with cues not to move shoulder ?Seated scapular retractions with hands supported in lap 5 sec X15 ?Seated gripping for Left hand blue  digigrip 2X20 ?Seated tableslide PROM left shoulder flexion, scaption, ER X15 each holding 5 sec ?Manual PROM left shoulder gentle to tolerance but guarding with flexion ? ?Vasopnuematic X 10 min to Lt shoulder lowest temp, medium compression ? ?04/28/21 ?Standing pendulums X15 each for CW,CCW,A-P,lateral ?Standing Left elbow flexion/extension AROM X15 ?Seated scapular retractions with hands supported in lap 5 sec X15 ?Seated tableslide PROM left shoulder flexion, scaption, ER X10 each ?Manual PROM left shoulder gentle to tolerance but guarding noted especially with flexion ? ?Vasopnuematic X 10 min to Lt shoulder lowest temp, medium compression ? ?  ?  ?PATIENT EDUCATION: ?05/10/2021 ?Education details: HEP progression.  ?Person educated: Patient ?Education method: Explanation, Demonstration, Verbal cues, and Handouts ?Education comprehension: verbalized understanding and returned demonstration ?  ?  ?HOME EXERCISE PROGRAM: ?Access Code: [email or web address] ?URL: [email or web address] ?Date: 05/10/2021 ?Prepared by: Scot Jun ? ?Exercises ?- Standing Circular Shoulder Pendulum Supported with Arm Bent  - 2-3 x daily - 7 x weekly - 1-2 sets - 10 reps ?- Standing Flexion Extension Shoulder Pendulum Supported with Arm Bent  - 2-3 x daily - 7 x weekly - 1-2 sets - 10 reps ?- Standing Horizontal Shoulder Pendulum Supported with Arm Bent  - 2-3 x daily - 7 x weekly - 1-2 sets - 10 reps ?- Seated Scapular Retraction  - 3-5 x daily - 7 x weekly - 1 sets - 10 reps - 3-5 hold ?- Seated Shoulder Abduction Towel Slide at Table Top  - 2-3 x daily - 6 x weekly - 1-2 sets - 10 reps - 5 hold ?- Supine Shoulder Flexion Extension AAROM with [place]  - 2 x daily - 7 x weekly - 1-2 sets - 10 reps - 5 hold ?- Supine Shoulder External Rotation in 45 Degrees Abduction AAROM with Dowel  - 2 x daily - 7 x weekly - 1-2 sets - 10 reps - 5 hold ?  ?  ?ASSESSMENT: ?  ?CLINICAL IMPRESSION: ?AROM allowed per last MD office visit note but Pt  did not demonstrate the ability to perform AROM unassisted in any position.  AAROM in select directions was tolerable and added to HEP.  Continued overall mobility restriction c muscle strength deficits no

## 2021-05-12 ENCOUNTER — Ambulatory Visit: Payer: No Typology Code available for payment source | Admitting: Rehabilitative and Restorative Service Providers"

## 2021-05-12 ENCOUNTER — Other Ambulatory Visit: Payer: Self-pay

## 2021-05-12 ENCOUNTER — Other Ambulatory Visit: Payer: Self-pay | Admitting: Orthopedic Surgery

## 2021-05-12 ENCOUNTER — Encounter: Payer: Self-pay | Admitting: Rehabilitative and Restorative Service Providers"

## 2021-05-12 DIAGNOSIS — M25512 Pain in left shoulder: Secondary | ICD-10-CM | POA: Diagnosis not present

## 2021-05-12 DIAGNOSIS — G8929 Other chronic pain: Secondary | ICD-10-CM | POA: Diagnosis not present

## 2021-05-12 DIAGNOSIS — R293 Abnormal posture: Secondary | ICD-10-CM | POA: Diagnosis not present

## 2021-05-12 DIAGNOSIS — M6281 Muscle weakness (generalized): Secondary | ICD-10-CM | POA: Diagnosis not present

## 2021-05-12 DIAGNOSIS — M25612 Stiffness of left shoulder, not elsewhere classified: Secondary | ICD-10-CM

## 2021-05-12 DIAGNOSIS — R600 Localized edema: Secondary | ICD-10-CM

## 2021-05-12 MED ORDER — HYDROCODONE-ACETAMINOPHEN 5-325 MG PO TABS: 1.0000 | ORAL_TABLET | Freq: Two times a day (BID) | ORAL | 0 refills | Status: DC | PRN

## 2021-05-12 NOTE — Telephone Encounter (Signed)
Pt called requesting a refill of hydrocodone. Please send to pharmacy on file. Pt phone number is [phone]. ?

## 2021-05-12 NOTE — Therapy (Signed)
?OUTPATIENT PHYSICAL THERAPY TREATMENT NOTE ? ? ?Patient Name: Dennis Villarreal ?MRN: [id number] ?DOB:1950-09-22, 71 y.o., male ?Today's Date: 05/12/2021 ? ?PCP: [name], MD ?REFERRING PROVIDER: Meredith Pel, MD ? ?END OF SESSION:  ? PT End of Session - 05/12/21 1025   ? ? Visit Number 5   ? Number of Visits 20   ? Date for PT Re-Evaluation 07/05/21   ? Authorization Type [employer] $40 copay   ? Progress Note Due on Visit 10   ? PT Start Time 1018   ? PT Stop Time 1057   ? PT Time Calculation (min) 39 min   ? Activity Tolerance Patient tolerated treatment well   ? Behavior During Therapy [facility] for tasks assessed/performed   ? ?  ?  ? ?  ? ? ? ? ?Past Medical History:  ?Diagnosis Date  ? ALLERGIC RHINITIS 09/04/2009  ? Basal cell carcinoma   ? Cancer [facility]) 2012  ? melanoma  ? DYSLIPIDEMIA 09/04/2009  ? GERD 09/04/2009  ? High cholesterol   ? just above borderline per patient   ? ?Past Surgical History:  ?Procedure Laterality Date  ? GALLBLADDER SURGERY  01/2018  ? SHOULDER ARTHROSCOPY WITH OPEN ROTATOR CUFF REPAIR AND DISTAL CLAVICLE ACROMINECTOMY Left 04/13/2021  ? Procedure: LEFT SHOULDER ARTHROSCOPY, DEBRIDEMENT, MINI OPEN ROTATOR CUFF TEAR REPAIR, BICEPS TENODESIS;  Surgeon: Meredith Pel, MD;  Location: [place];  Service: Orthopedics;  Laterality: Left;  ? [place], 2009  ? left shoulder scromoplasty  ? TONSILLECTOMY    ? TRIGGER FINGER RELEASE Left   ? ?Patient Active Problem List  ? Diagnosis Date Noted  ? Complete tear of left rotator cuff   ? Biceps tendonitis on left   ? Degenerative superior labral anterior-to-posterior (SLAP) tear of left shoulder   ? BPH (benign prostatic hyperplasia) 05/24/2017  ? Diplopia 03/18/2014  ? Dizziness and giddiness 03/18/2014  ? Vision loss 03/18/2014  ? Sinusitis 02/03/2013  ? Anxiety and depression 09/13/2012  ? History of melanoma 06/30/2011  ? Hyperlipidemia 09/04/2009  ? ALLERGIC RHINITIS 09/04/2009  ? GERD 09/04/2009  ? ? ?THERAPY  DIAG:  ?Chronic left shoulder pain ? ?Stiffness of left shoulder, not elsewhere classified ? ?Muscle weakness (generalized) ? ?Localized edema ? ?Abnormal posture ? ?  ?REFERRING DIAG: Z98.890 (ICD-10-CM) - History of arthroscopy of left shoulder ?J88.416 (ICD-10-CM) - S/P rotator cuff repair ?  ?  ?ONSET DATE: Surgery 04/13/2021 ?  ?SUBJECTIVE:                                                                                                                                                                                     ?  ?  SUBJECTIVE STATEMENT:  Pt indicated he got pulleys for home yesterday. Pt stated getting some improvement in movement but has felt some pain at times.  No pain at rest today. ?  ?PERTINENT HISTORY: ?Had surgery 04/13/2021 for rotator cuff repair. History of melanoma ?  ?PAIN:  ? ?NPRS scale: current:0/10. ?Pain location: Lt shoulder  ?Pain description: achy, dull ?Aggravating factors: morning complaints, sometimes after exercise ?Relieving factors: pain medicine ?  ?PRECAUTIONS: AROM progression.  No strengthening with resistance until 05/25/2021. (Updated from MD office visit on 05/05/2021 ?  ?  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  Maintenance technician at [employer]  Various lifting/carrying activity.  ?  ?PLOF: Independent , yard care, Rt hand dominant ?  ?PATIENT GOALS Reduce pain, get back to work and hobbies ?  ?OBJECTIVE:  ?  ?PATIENT SURVEYS:  ? 04/26/2021: FOTO intake:  36 predicted: 59  ?  ?COGNITION: ?04/26/2021: Overall cognitive status: Within functional limits for tasks assessed ?                                    ?SENSATION: ? 04/26/2021 WFL ?  ?Edema:  04/26/2021: localized edema Lt shoulder ?  ?POSTURE: ?04/26/2021 Wearing sling  ?  ?UPPER EXTREMITY ROM:  ?  ?ROM Right ?04/26/2021 Left PROM ?04/26/2021 Left PROM  ?in supine ?05/03/21 Left PROM ?05/10/2021 ?  ?Shoulder flexion   95 PROM in supine, pain at end range 100 (muscle guarding and pain noted) 110 deg in  supine  ?Shoulder extension        ?Shoulder abduction   90 PROM in supine, pain at end range 100   ?Shoulder adduction        ?Shoulder internal rotation   To belly  To belly   ?Shoulder external rotation   0 PROM in supine, pain at end range 10 PROM in 45 deg abduction ?32 deg  ?Elbow flexion        ?Elbow extension        ?Wrist flexion        ?Wrist extension        ?Wrist ulnar deviation        ?Wrist radial deviation        ?Wrist pronation        ?Wrist supination        ?(Blank rows = not tested) ?  ?UPPER EXTREMITY MMT: ?  ?MMT Right ?04/26/2021 Left ?04/26/2021 ?Not tested due to surgery protocol Left ?05/10/2021  ?Shoulder flexion 5/5   1/5  ?Shoulder extension       ?Shoulder abduction 5/5   1/5  ?Shoulder adduction       ?Shoulder internal rotation 5/5     ?Shoulder external rotation 5/5     ?Middle trapezius       ?Lower trapezius       ?Elbow flexion 5/5     ?Elbow extension 5/5     ?Wrist flexion       ?Wrist extension       ?Wrist ulnar deviation       ?Wrist radial deviation       ?Wrist pronation       ?Wrist supination       ?Grip strength (lbs)       ?(Blank rows = not tested) ?  ?JOINT MOBILITY TESTING:  ?04/26/2021 : early to mid range Lt Gh joint mobility assessment was normal for capsular  mobility. Guarding was noted from musculature.  ?  ?PALPATION:  ?04/26/2021 : mild tenderness surrounding Lt shoulder grossly.  ?             ?TODAY'S TREATMENT:  ?05/12/2021: ?  Therex: ?   Pulleys 3 mins flexion ?   Pulleys 3 mins scaption ?   Supine AAROM 1 lb bar flexion 2 x 10 to tolerance ?   Supine wand protraction bilateral 3 sec hold x 10 in 90 deg flexion ?   Supine wand ER stretch 5 sec hold x 15 ?   Seated scapular 5 sec hold x 10  ?   Standing wand AAROM abduction 1 lb 2 x 10  ?   ?  Manual: ?   G3 inferior Lt GH joint mobs, Mobilization c movement posterior glide to Lt [place] jt c PROM for ER.  ? ?05/10/2021: ?  Therex: ?   Pulleys 3 mins flexion ?   Pulleys 3 mins scaption ?   Supine AAROM 1 lb bar flexion  2 x 10 to tolerance ?   Supine wand ER stretch 5 sec hold x 15 ?   Seated scapular 5 sec hold x 10  ?   ?  Manual: ?   G3 inferior Lt GH joint mobs, Mobilization c movement posterior glide to Lt [place] jt c PROM for ER.  PROM all directions. ? ? ?05/03/21 ?Standing pendulums X15 each for CW,CCW,A-P,lateral ?Standing Left elbow flexion/extension AROM X15 with cues not to move shoulder ?Seated scapular retractions with hands supported in lap 5 sec X15 ?Seated gripping for Left hand blue digigrip 2X20 ?Seated tableslide PROM left shoulder flexion, scaption, ER X15 each holding 5 sec ?Manual PROM left shoulder gentle to tolerance but guarding with flexion ? ?Vasopnuematic X 10 min to Lt shoulder lowest temp, medium compression ? ?04/28/21 ?Standing pendulums X15 each for CW,CCW,A-P,lateral ?Standing Left elbow flexion/extension AROM X15 ?Seated scapular retractions with hands supported in lap 5 sec X15 ?Seated tableslide PROM left shoulder flexion, scaption, ER X10 each ?Manual PROM left shoulder gentle to tolerance but guarding noted especially with flexion ? ?Vasopnuematic X 10 min to Lt shoulder lowest temp, medium compression ? ?  ?  ?PATIENT EDUCATION: ?05/10/2021 ?Education details: HEP progression.  ?Person educated: Patient ?Education method: Explanation, Demonstration, Verbal cues, and Handouts ?Education comprehension: verbalized understanding and returned demonstration ?  ?  ?HOME EXERCISE PROGRAM: ?Access Code: [email or web address] ?URL: [email or web address] ?Date: 05/10/2021 ?Prepared by: Scot Jun ? ?Exercises ?- Standing Circular Shoulder Pendulum Supported with Arm Bent  - 2-3 x daily - 7 x weekly - 1-2 sets - 10 reps ?- Standing Flexion Extension Shoulder Pendulum Supported with Arm Bent  - 2-3 x daily - 7 x weekly - 1-2 sets - 10 reps ?- Standing Horizontal Shoulder Pendulum Supported with Arm Bent  - 2-3 x daily - 7 x weekly - 1-2 sets - 10 reps ?- Seated Scapular Retraction  - 3-5 x daily - 7 x  weekly - 1 sets - 10 reps - 3-5 hold ?- Seated Shoulder Abduction Towel Slide at Table Top  - 2-3 x daily - 6 x weekly - 1-2 sets - 10 reps - 5 hold ?- Supine Shoulder Flexion Extension AAROM with [place]  - 2 x

## 2021-05-13 ENCOUNTER — Other Ambulatory Visit: Payer: Self-pay | Admitting: Family Medicine

## 2021-05-17 ENCOUNTER — Encounter: Payer: Self-pay | Admitting: Rehabilitative and Restorative Service Providers"

## 2021-05-17 ENCOUNTER — Ambulatory Visit: Payer: No Typology Code available for payment source | Admitting: Rehabilitative and Restorative Service Providers"

## 2021-05-17 ENCOUNTER — Other Ambulatory Visit: Payer: Self-pay

## 2021-05-17 DIAGNOSIS — M25612 Stiffness of left shoulder, not elsewhere classified: Secondary | ICD-10-CM | POA: Diagnosis not present

## 2021-05-17 DIAGNOSIS — G8929 Other chronic pain: Secondary | ICD-10-CM

## 2021-05-17 DIAGNOSIS — M25512 Pain in left shoulder: Secondary | ICD-10-CM

## 2021-05-17 DIAGNOSIS — R293 Abnormal posture: Secondary | ICD-10-CM

## 2021-05-17 DIAGNOSIS — R600 Localized edema: Secondary | ICD-10-CM | POA: Diagnosis not present

## 2021-05-17 DIAGNOSIS — M6281 Muscle weakness (generalized): Secondary | ICD-10-CM

## 2021-05-17 NOTE — Therapy (Signed)
?OUTPATIENT PHYSICAL THERAPY TREATMENT NOTE ? ? ?Patient Name: Dennis Villarreal ?MRN: [id number] ?DOB:09/17/1950, 71 y.o., male ?Today's Date: 05/17/2021 ? ?PCP: [name], MD ?REFERRING PROVIDER: Meredith Pel, MD ? ?END OF SESSION:  ? PT End of Session - 05/17/21 1016   ? ? Visit Number 6   ? Number of Visits 20   ? Date for PT Re-Evaluation 07/05/21   ? Authorization Type [employer] $40 copay   ? Progress Note Due on Visit 10   ? PT Start Time 1011   ? PT Stop Time 1051   ? PT Time Calculation (min) 40 min   ? Activity Tolerance Patient tolerated treatment well   ? Behavior During Therapy [facility] for tasks assessed/performed   ? ?  ?  ? ?  ? ? ? ? ? ?Past Medical History:  ?Diagnosis Date  ? ALLERGIC RHINITIS 09/04/2009  ? Basal cell carcinoma   ? Cancer [facility]) 2012  ? melanoma  ? DYSLIPIDEMIA 09/04/2009  ? GERD 09/04/2009  ? High cholesterol   ? just above borderline per patient   ? ?Past Surgical History:  ?Procedure Laterality Date  ? GALLBLADDER SURGERY  01/2018  ? SHOULDER ARTHROSCOPY WITH OPEN ROTATOR CUFF REPAIR AND DISTAL CLAVICLE ACROMINECTOMY Left 04/13/2021  ? Procedure: LEFT SHOULDER ARTHROSCOPY, DEBRIDEMENT, MINI OPEN ROTATOR CUFF TEAR REPAIR, BICEPS TENODESIS;  Surgeon: Meredith Pel, MD;  Location: [place];  Service: Orthopedics;  Laterality: Left;  ? [place], 2009  ? left shoulder scromoplasty  ? TONSILLECTOMY    ? TRIGGER FINGER RELEASE Left   ? ?Patient Active Problem List  ? Diagnosis Date Noted  ? Complete tear of left rotator cuff   ? Biceps tendonitis on left   ? Degenerative superior labral anterior-to-posterior (SLAP) tear of left shoulder   ? BPH (benign prostatic hyperplasia) 05/24/2017  ? Diplopia 03/18/2014  ? Dizziness and giddiness 03/18/2014  ? Vision loss 03/18/2014  ? Sinusitis 02/03/2013  ? Anxiety and depression 09/13/2012  ? History of melanoma 06/30/2011  ? Hyperlipidemia 09/04/2009  ? ALLERGIC RHINITIS 09/04/2009  ? GERD 09/04/2009  ? ? ?THERAPY  DIAG:  ?Chronic left shoulder pain ? ?Stiffness of left shoulder, not elsewhere classified ? ?Muscle weakness (generalized) ? ?Localized edema ? ?Abnormal posture ? ?  ?REFERRING DIAG: Z98.890 (ICD-10-CM) - History of arthroscopy of left shoulder ?S28.315 (ICD-10-CM) - S/P rotator cuff repair ? ?PERTINENT HISTORY: ?Had surgery 04/13/2021 for rotator cuff repair. History of melanoma  ?  ?ONSET DATE: Surgery 04/13/2021 ?  ?SUBJECTIVE:                                                                                                                                                                                     ?  ?  SUBJECTIVE STATEMENT:  Pt indicated he has been trying to get off pain medicine and has still had to take it to this point.  Pt indicated he hasn't been feeling well while taking it.  Pt indicated he has the sling off and has used arm a bit more and has been doing well with pain response.  ?  ? ?PAIN:  ? ?NPRS scale: current 2/10, at worst in last 3-4 days : 6/10 ?Pain location: Lt shoulder  ?Pain description: achy, dull ?Aggravating factors: end range stretching pain ?Relieving factors: pain medicine, OTC medicine ?  ?PRECAUTIONS: AROM progression.  No strengthening with resistance until 05/25/2021. (Updated from MD office visit on 05/05/2021 ?  ?  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  ?  ?PLOF: Independent , yard care, Rt hand dominant ?  ?PATIENT GOALS Reduce pain, get back to work and hobbies ?  ?OBJECTIVE:  ?  ?PATIENT SURVEYS:  ? 04/26/2021: FOTO intake:  36 predicted: 59  ?  ?COGNITION: ?04/26/2021: Overall cognitive status: Within functional limits for tasks assessed ?                                    ?SENSATION: ? 04/26/2021 WFL ?  ?Edema:  04/26/2021: localized edema Lt shoulder ?  ?POSTURE: ?04/26/2021 Wearing sling  ?  ?UPPER EXTREMITY ROM:  ?  ?ROM Right ?04/26/2021 Left PROM ?04/26/2021 Left PROM  ?in  supine ?05/03/21 Left PROM ?05/10/2021 ? Left PROM ?05/17/2021  ?Shoulder flexion   95 PROM in supine, pain at end range 100 (muscle guarding and pain noted) 110 deg in supine 130 in supine  ?Shoulder extension         ?Shoulder abduction   90 PROM in supine, pain at end range 100  111 in supine  ?Shoulder adduction         ?Shoulder internal rotation   To belly  To belly    ?Shoulder external rotation   0 PROM in supine, pain at end range 10 PROM in 45 deg abduction ?32 deg 48 deg in 45 deg abduction supine  ?Elbow flexion         ?Elbow extension         ?Wrist flexion         ?Wrist extension         ?Wrist ulnar deviation         ?Wrist radial deviation         ?Wrist pronation         ?Wrist supination         ?(Blank rows = not tested) ?  ?UPPER EXTREMITY MMT: ?  ?MMT Right ?04/26/2021 Left ?04/26/2021 ?Not tested due to surgery protocol Left ?05/10/2021  ?Shoulder flexion 5/5   1/5  ?Shoulder extension       ?Shoulder abduction 5/5   1/5  ?Shoulder adduction       ?Shoulder internal rotation 5/5     ?Shoulder external rotation 5/5     ?Middle trapezius       ?Lower trapezius       ?Elbow flexion 5/5     ?Elbow extension 5/5     ?Wrist flexion       ?Wrist extension       ?Wrist ulnar deviation       ?Wrist radial deviation       ?  Wrist pronation       ?Wrist supination       ?Grip strength (lbs)       ?(Blank rows = not tested) ?  ?JOINT MOBILITY TESTING:  ?04/26/2021 : early to mid range Lt Gh joint mobility assessment was normal for capsular mobility. Guarding was noted from musculature.  ?  ?PALPATION:  ?04/26/2021 : mild tenderness surrounding Lt shoulder grossly.  ?             ?TODAY'S TREATMENT:  ?05/17/2021: ?  Therex: ?   Pulleys 3 mins flexion ?   Pulleys 3 mins scaption ?   Supine AROM ER/IR alternating in 45 deg abduction x 15 ?   Supine AAROM 1 lb bar flexion 2 x 10 to tolerance ?   Supine wand protraction bilateral 3 sec hold x 15 in 90 deg flexion ?   Standing wand AAROM abduction 1 lb 2 x 10   ?   ?  Manual: ?   G3 inferior Lt GH joint mobs, Mobilization c movement posterior glide to Lt [place] jt c PROM for ER.  ? ?05/12/2021: ?  Therex: ?   Pulleys 3 mins flexion ?   Pulleys 3 mins scaption ?   Supine AAROM 1 lb bar flexion 2 x 10 to tolerance ?   Supine wand protraction bilateral 3 sec hold x 10 in 90 deg flexion ?   Supine wand ER stretch 5 sec hold x 15 ?   Seated scapular 5 sec hold x 10  ?   Standing wand AAROM abduction 1 lb 2 x 10  ?   ?  Manual: ?   G3 inferior Lt GH joint mobs, Mobilization c movement posterior glide to Lt [place] jt c PROM for ER.  ? ?05/10/2021: ?  Therex: ?   Pulleys 3 mins flexion ?   Pulleys 3 mins scaption ?   Supine AAROM 1 lb bar flexion 2 x 10 to tolerance ?   Supine wand ER stretch 5 sec hold x 15 ?   Seated scapular 5 sec hold x 10  ?   ?  Manual: ?   G3 inferior Lt GH joint mobs, Mobilization c movement posterior glide to Lt [place] jt c PROM for ER.  PROM all directions. ? ? ?05/03/21 ?Standing pendulums X15 each for CW,CCW,A-P,lateral ?Standing Left elbow flexion/extension AROM X15 with cues not to move shoulder ?Seated scapular retractions with hands supported in lap 5 sec X15 ?Seated gripping for Left hand blue digigrip 2X20 ?Seated tableslide PROM left shoulder flexion, scaption, ER X15 each holding 5 sec ?Manual PROM left shoulder gentle to tolerance but guarding with flexion ? ?Vasopnuematic X 10 min to Lt shoulder lowest temp, medium compression ?  ?  ?PATIENT EDUCATION: ?05/10/2021 ?Education details: HEP progression.  ?Person educated: Patient ?Education method: Explanation, Demonstration, Verbal cues, and Handouts ?Education comprehension: verbalized understanding and returned demonstration ?  ?  ?HOME EXERCISE PROGRAM: ?Access Code: [email or web address] ?URL: [email or web address] ?Date: 05/10/2021 ?Prepared by: Scot Jun ? ?Exercises ?- Standing Circular Shoulder Pendulum Supported with Arm Bent  - 2-3 x daily - 7 x weekly - 1-2 sets - 10 reps ?- Standing Flexion  Extension Shoulder Pendulum Supported with Arm Bent  - 2-3 x daily - 7 x weekly - 1-2 sets - 10 reps ?- Standing Horizontal Shoulder Pendulum Supported with Arm Bent  - 2-3 x daily - 7 x weekly - 1-2 sets - 10 reps ?-

## 2021-05-19 ENCOUNTER — Other Ambulatory Visit: Payer: Self-pay

## 2021-05-19 ENCOUNTER — Encounter: Payer: Self-pay | Admitting: Rehabilitative and Restorative Service Providers"

## 2021-05-19 ENCOUNTER — Ambulatory Visit: Payer: No Typology Code available for payment source | Admitting: Rehabilitative and Restorative Service Providers"

## 2021-05-19 DIAGNOSIS — G8929 Other chronic pain: Secondary | ICD-10-CM | POA: Diagnosis not present

## 2021-05-19 DIAGNOSIS — M6281 Muscle weakness (generalized): Secondary | ICD-10-CM | POA: Diagnosis not present

## 2021-05-19 DIAGNOSIS — R600 Localized edema: Secondary | ICD-10-CM

## 2021-05-19 DIAGNOSIS — M25512 Pain in left shoulder: Secondary | ICD-10-CM | POA: Diagnosis not present

## 2021-05-19 DIAGNOSIS — R293 Abnormal posture: Secondary | ICD-10-CM

## 2021-05-19 DIAGNOSIS — M25612 Stiffness of left shoulder, not elsewhere classified: Secondary | ICD-10-CM | POA: Diagnosis not present

## 2021-05-19 NOTE — Therapy (Signed)
?OUTPATIENT PHYSICAL THERAPY TREATMENT NOTE ? ? ?Patient Name: Dennis Villarreal ?MRN: [id number] ?DOB:[birth year], 71 y.o., male ?Today's Date: 05/19/2021 ? ?PCP: [name], MD ?REFERRING PROVIDER: Meredith Pel, MD ? ?END OF SESSION:  ? PT End of Session - 05/19/21 1022   ? ? Visit Number 7   ? Number of Visits 20   ? Date for PT Re-Evaluation 07/05/21   ? Authorization Type [employer] $40 copay   ? Progress Note Due on Visit 10   ? PT Start Time 1015   ? PT Stop Time 1055   ? PT Time Calculation (min) 40 min   ? Activity Tolerance Patient tolerated treatment well   ? Behavior During Therapy [facility] for tasks assessed/performed   ? ?  ?  ? ?  ? ? ? ? ? ? ?Past Medical History:  ?Diagnosis Date  ? ALLERGIC RHINITIS 09/04/2009  ? Basal cell carcinoma   ? Cancer [facility]) 2012  ? melanoma  ? DYSLIPIDEMIA 09/04/2009  ? GERD 09/04/2009  ? High cholesterol   ? just above borderline per patient   ? ?Past Surgical History:  ?Procedure Laterality Date  ? GALLBLADDER SURGERY  01/2018  ? SHOULDER ARTHROSCOPY WITH OPEN ROTATOR CUFF REPAIR AND DISTAL CLAVICLE ACROMINECTOMY Left 04/13/2021  ? Procedure: LEFT SHOULDER ARTHROSCOPY, DEBRIDEMENT, MINI OPEN ROTATOR CUFF TEAR REPAIR, BICEPS TENODESIS;  Surgeon: Meredith Pel, MD;  Location: [place];  Service: Orthopedics;  Laterality: Left;  ? [place], 2009  ? left shoulder scromoplasty  ? TONSILLECTOMY    ? TRIGGER FINGER RELEASE Left   ? ?Patient Active Problem List  ? Diagnosis Date Noted  ? Complete tear of left rotator cuff   ? Biceps tendonitis on left   ? Degenerative superior labral anterior-to-posterior (SLAP) tear of left shoulder   ? BPH (benign prostatic hyperplasia) 05/24/2017  ? Diplopia 03/18/2014  ? Dizziness and giddiness 03/18/2014  ? Vision loss 03/18/2014  ? Sinusitis 02/03/2013  ? Anxiety and depression 09/13/2012  ? History of melanoma 06/30/2011  ? Hyperlipidemia 09/04/2009  ? ALLERGIC RHINITIS 09/04/2009  ? GERD 09/04/2009   ? ? ?THERAPY DIAG:  ?Chronic left shoulder pain ? ?Stiffness of left shoulder, not elsewhere classified ? ?Muscle weakness (generalized) ? ?Localized edema ? ?Abnormal posture ? ?  ?REFERRING DIAG: Z98.890 (ICD-10-CM) - History of arthroscopy of left shoulder ?W09.811 (ICD-10-CM) - S/P rotator cuff repair ? ?PERTINENT HISTORY: ?Had surgery 04/13/2021 for rotator cuff repair. History of melanoma  ?  ?ONSET DATE: Surgery 04/13/2021 ?  ?SUBJECTIVE:                                                                                                                                                                                     ?  ?  SUBJECTIVE STATEMENT:  Pt indicated similar overall complaints with noted in morning.  ? ?PAIN:  ? ?NPRS scale: 1/10 upon arrival ?Pain location: Lt shoulder  ?Pain description: achy ?Aggravating factors:end range, nighttime pain ?Relieving factors: pain medicine, OTC medicine ?  ?PRECAUTIONS: AROM progression.  No strengthening with resistance until 05/25/2021. (Updated from MD office visit on 05/05/2021 ? 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.  Various lifting/carrying activity.  ?  ?PLOF: Independent , yard care, Rt hand dominant ?  ?PATIENT GOALS Reduce pain, get back to work and hobbies ?  ?OBJECTIVE:  ?  ?PATIENT SURVEYS:  ? 04/26/2021: FOTO intake:  36 predicted: 59  ?  ?COGNITION: ?04/26/2021: Overall cognitive status: Within functional limits for tasks assessed ?                                    ?SENSATION: ? 04/26/2021 WFL ?  ?Edema:  04/26/2021: localized edema Lt shoulder ?  ?POSTURE: ?04/26/2021 Wearing sling  ?  ?UPPER EXTREMITY ROM:  ?  ?ROM Right ?04/26/2021 Left PROM ?04/26/2021 Left PROM  ?in supine ?05/03/21 Left PROM ?05/10/2021 ? Left PROM ?05/17/2021  ?Shoulder flexion   95 PROM in supine, pain at end range 100 (muscle guarding and pain noted) 110 deg in supine 130 in supine  ?Shoulder extension         ?Shoulder abduction    90 PROM in supine, pain at end range 100  111 in supine  ?Shoulder adduction         ?Shoulder internal rotation   To belly  To belly    ?Shoulder external rotation   0 PROM in supine, pain at end range 10 PROM in 45 deg abduction ?32 deg 48 deg in 45 deg abduction supine  ?Elbow flexion         ?Elbow extension         ?Wrist flexion         ?Wrist extension         ?Wrist ulnar deviation         ?Wrist radial deviation         ?Wrist pronation         ?Wrist supination         ?(Blank rows = not tested) ?  ?UPPER EXTREMITY MMT: ?  ?MMT Right ?04/26/2021 Left ?04/26/2021 ?Not tested due to surgery protocol Left ?05/10/2021  ?Shoulder flexion 5/5   1/5  ?Shoulder extension       ?Shoulder abduction 5/5   1/5  ?Shoulder adduction       ?Shoulder internal rotation 5/5     ?Shoulder external rotation 5/5     ?Middle trapezius       ?Lower trapezius       ?Elbow flexion 5/5     ?Elbow extension 5/5     ?Wrist flexion       ?Wrist extension       ?Wrist ulnar deviation       ?Wrist radial deviation       ?Wrist pronation       ?Wrist supination       ?Grip strength (lbs)       ?(Blank rows = not tested) ?  ?JOINT MOBILITY TESTING:  ?04/26/2021 : early to mid range Lt Gh joint mobility assessment was normal for capsular mobility. Guarding was noted from musculature.  ?  ?  PALPATION:  ?04/26/2021 : mild tenderness surrounding Lt shoulder grossly.  ?             ?TODAY'S TREATMENT:  ?05/19/2021: ?  Therex: ?   UBE fwd/back 3 mins each way AAROM ?Pulleys 3 mins flexion ?   Pulleys 3 mins scaption ?   Supine AROM ER/IR alternating in 45 deg abduction x 15 (self sustained posterior glide from Rt arm) ?   Supine AAROM 2 lb bar flexion 2x15  to tolerance ?   Supine AROM 0-80 deg ( to tolerance) 2 x 5 ?   Standing wand AAROM abduction 1 lb 2 x 10  ?   Standing painfree isometric flexion, ER Lt arm 5 sec hold x 5 ?   ?  Manual: ?   G4 inferior Lt GH joint mobs, Mobilization c movement posterior glide to Lt [place] jt c PROM for ER.   ? ?05/17/2021: ?  Therex: ?   Pulleys 3 mins flexion ?   Pulleys 3 mins scaption ?   Supine AROM ER/IR alternating in 45 deg abduction x 15 ?   Supine AAROM 1 lb bar flexion 2 x 10 to tolerance ?   Supine wand protraction bilateral 3 sec hold x 15 in 90 deg flexion ?   Standing wand AAROM abduction 1 lb 2 x 10  ?   ?  Manual: ?   G3 inferior Lt GH joint mobs, Mobilization c movement posterior glide to Lt [place] jt c PROM for ER.  ? ?05/12/2021: ?  Therex: ?   Pulleys 3 mins flexion ?   Pulleys 3 mins scaption ?   Supine AAROM 1 lb bar flexion 2 x 10 to tolerance ?   Supine wand protraction bilateral 3 sec hold x 10 in 90 deg flexion ?   Supine wand ER stretch 5 sec hold x 15 ?   Seated scapular 5 sec hold x 10  ?   Standing wand AAROM abduction 1 lb 2 x 10  ?   ?  Manual: ?   G3 inferior Lt GH joint mobs, Mobilization c movement posterior glide to Lt [place] jt c PROM for ER.  ? ?  ?PATIENT EDUCATION: ?05/19/2021 ?Education details: HEP progression.  ?Person educated: Patient ?Education method: Explanation, Demonstration, Verbal cues, and Handouts ?Education comprehension: verbalized understanding and returned demonstration ?  ?  ?HOME EXERCISE PROGRAM: ?Access Code: [email or web address] ?URL: [email or web address] ?Date: 05/19/2021 ?Prepared by: Scot Jun ? ?Exercises ?- Seated Scapular Retraction  - 3-5 x daily - 7 x weekly - 1 sets - 10 reps - 3-5 hold ?- Seated Shoulder Abduction Towel Slide at Table Top  - 2-3 x daily - 6 x weekly - 1-2 sets - 10 reps - 5 hold ?- Supine Shoulder Flexion Extension AAROM with Dowel  - 2 x daily - 7 x weekly - 1-2 sets - 10 reps - 5 hold ?- Supine Shoulder External Rotation in 45 Degrees Abduction AAROM with Dowel  - 2 x daily - 7 x weekly - 1-2 sets - 10 reps - 5 hold ?- Seated Shoulder Flexion AAROM with Pulley Behind  - 2-3 x daily - 7 x weekly - 1 sets - 3 mins hold ?- Seated Shoulder Scaption AAROM with Pulley at Side  - 2-3 x daily - 7 x weekly - 1 sets - 3 min hold ?- Isometric  Shoulder Flexion at Wall  - 2 x daily - 7 x weekly - 1 sets - 10-15 reps -  5 hold ?- Standing Isometric Shoulder External Rotation with Doorway  - 2 x daily - 7 x weekly - 1 sets - 10-15 reps - 5 hold ?  ?  ?ASSESSMENT: ?  ?

## 2021-05-22 ENCOUNTER — Encounter: Payer: Self-pay | Admitting: Orthopedic Surgery

## 2021-05-24 ENCOUNTER — Ambulatory Visit: Payer: No Typology Code available for payment source | Admitting: Rehabilitative and Restorative Service Providers"

## 2021-05-24 ENCOUNTER — Other Ambulatory Visit: Payer: Self-pay

## 2021-05-24 ENCOUNTER — Encounter: Payer: Self-pay | Admitting: Rehabilitative and Restorative Service Providers"

## 2021-05-24 DIAGNOSIS — M25512 Pain in left shoulder: Secondary | ICD-10-CM

## 2021-05-24 DIAGNOSIS — M25612 Stiffness of left shoulder, not elsewhere classified: Secondary | ICD-10-CM | POA: Diagnosis not present

## 2021-05-24 DIAGNOSIS — G8929 Other chronic pain: Secondary | ICD-10-CM

## 2021-05-24 DIAGNOSIS — R600 Localized edema: Secondary | ICD-10-CM

## 2021-05-24 DIAGNOSIS — M6281 Muscle weakness (generalized): Secondary | ICD-10-CM | POA: Diagnosis not present

## 2021-05-24 DIAGNOSIS — R293 Abnormal posture: Secondary | ICD-10-CM | POA: Diagnosis not present

## 2021-05-24 NOTE — Telephone Encounter (Signed)
Sure pending office exam but should be fine

## 2021-05-24 NOTE — Therapy (Signed)
?OUTPATIENT PHYSICAL THERAPY TREATMENT NOTE ? ? ?Patient Name: Dennis Villarreal ?MRN: [id number] ?DOB:02-23-1950, 71 y.o., male ?Today's Date: 05/24/2021 ? ?PCP: [name], MD ?REFERRING PROVIDER: Meredith Pel, MD ? ?END OF SESSION:  ? PT End of Session - 05/24/21 1019   ? ? Visit Number 8   ? Number of Visits 20   ? Date for PT Re-Evaluation 07/05/21   ? Authorization Type [employer] $40 copay   ? Progress Note Due on Visit 10   ? PT Start Time 1014   ? PT Stop Time 1054   ? PT Time Calculation (min) 40 min   ? Activity Tolerance Patient tolerated treatment well   ? Behavior During Therapy [facility] for tasks assessed/performed   ? ?  ?  ? ?  ? ? ? ? ? ? ? ?Past Medical History:  ?Diagnosis Date  ? ALLERGIC RHINITIS 09/04/2009  ? Basal cell carcinoma   ? Cancer [facility]) 2012  ? melanoma  ? DYSLIPIDEMIA 09/04/2009  ? GERD 09/04/2009  ? High cholesterol   ? just above borderline per patient   ? ?Past Surgical History:  ?Procedure Laterality Date  ? GALLBLADDER SURGERY  01/2018  ? SHOULDER ARTHROSCOPY WITH OPEN ROTATOR CUFF REPAIR AND DISTAL CLAVICLE ACROMINECTOMY Left 04/13/2021  ? Procedure: LEFT SHOULDER ARTHROSCOPY, DEBRIDEMENT, MINI OPEN ROTATOR CUFF TEAR REPAIR, BICEPS TENODESIS;  Surgeon: Meredith Pel, MD;  Location: [place];  Service: Orthopedics;  Laterality: Left;  ? [place], 2009  ? left shoulder scromoplasty  ? TONSILLECTOMY    ? TRIGGER FINGER RELEASE Left   ? ?Patient Active Problem List  ? Diagnosis Date Noted  ? Complete tear of left rotator cuff   ? Biceps tendonitis on left   ? Degenerative superior labral anterior-to-posterior (SLAP) tear of left shoulder   ? BPH (benign prostatic hyperplasia) 05/24/2017  ? Diplopia 03/18/2014  ? Dizziness and giddiness 03/18/2014  ? Vision loss 03/18/2014  ? Sinusitis 02/03/2013  ? Anxiety and depression 09/13/2012  ? History of melanoma 06/30/2011  ? Hyperlipidemia 09/04/2009  ? ALLERGIC RHINITIS 09/04/2009  ? GERD 09/04/2009   ? ? ?THERAPY DIAG:  ?Chronic left shoulder pain ? ?Stiffness of left shoulder, not elsewhere classified ? ?Muscle weakness (generalized) ? ?Localized edema ? ?Abnormal posture ? ?  ?REFERRING DIAG: Z98.890 (ICD-10-CM) - History of arthroscopy of left shoulder ?V37.106 (ICD-10-CM) - S/P rotator cuff repair ? ?PERTINENT HISTORY: ?Had surgery 04/13/2021 for rotator cuff repair. History of melanoma  ?  ?ONSET DATE: Surgery 04/13/2021 ?  ?SUBJECTIVE:                                                                                                                                                                                     ?  ?  SUBJECTIVE STATEMENT:  Pt indicated a little pain today, 3/10.  Indicated he has been doing better with use of arm during the day.  Pt stated he did some painting at home that may influence feelings.  ? ?PAIN:  ? ?NPRS scale: 3/10 upon arrival ?Pain location: Lt shoulder  ?Pain description: achy ?Aggravating factors:end range, nighttime pain, after painting.  ?Relieving factors: pain medicine, OTC medicine ?  ?PRECAUTIONS: AROM progression.  No strengthening with resistance until 05/25/2021. (Updated from MD office visit on 05/05/2021 ? 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  ?  ?PLOF: Independent , yard care, Rt hand dominant ?  ?PATIENT GOALS Reduce pain, get back to work and hobbies ?  ?OBJECTIVE:  ?  ?PATIENT SURVEYS:  ? 04/26/2021: FOTO intake:  36 predicted: 59  ?  ?COGNITION: ?04/26/2021: Overall cognitive status: Within functional limits for tasks assessed ?                                    ?SENSATION: ? 04/26/2021 WFL ?  ?Edema:  04/26/2021: localized edema Lt shoulder ?  ?POSTURE: ?05/24/2021: no sling use ? ?04/26/2021 Wearing sling  ?  ?UPPER EXTREMITY ROM:  ?  ?ROM Right ?04/26/2021 Left PROM ?04/26/2021 Left PROM  ?in supine ?05/03/21 Left PROM ?05/10/2021 ? Left PROM ?05/17/2021  ?Shoulder flexion    95 PROM in supine, pain at end range 100 (muscle guarding and pain noted) 110 deg in supine 130 in supine  ?Shoulder extension         ?Shoulder abduction   90 PROM in supine, pain at end range 100  111 in supine  ?Shoulder adduction         ?Shoulder internal rotation   To belly  To belly    ?Shoulder external rotation   0 PROM in supine, pain at end range 10 PROM in 45 deg abduction ?32 deg 48 deg in 45 deg abduction supine  ?Elbow flexion         ?Elbow extension         ?Wrist flexion         ?Wrist extension         ?Wrist ulnar deviation         ?Wrist radial deviation         ?Wrist pronation         ?Wrist supination         ?(Blank rows = not tested) ?  ?UPPER EXTREMITY MMT: ?  ?MMT Right ?04/26/2021 Left ?04/26/2021 ?Not tested due to surgery protocol Left ?05/10/2021 Left ?05/24/2021  ?Shoulder flexion 5/5   1/5 2-/5  ?Shoulder extension        ?Shoulder abduction 5/5   1/5 2-/5  ?Shoulder adduction        ?Shoulder internal rotation 5/5      ?Shoulder external rotation 5/5      ?Middle trapezius        ?Lower trapezius        ?Elbow flexion 5/5      ?Elbow extension 5/5      ?Wrist flexion        ?Wrist extension        ?Wrist ulnar deviation        ?Wrist radial deviation        ?Wrist pronation        ?Wrist  supination        ?Grip strength (lbs)        ?(Blank rows = not tested) ?  ?JOINT MOBILITY TESTING:  ?04/26/2021 : early to mid range Lt Gh joint mobility assessment was normal for capsular mobility. Guarding was noted from musculature.  ?  ?PALPATION:  ?04/26/2021 : mild tenderness surrounding Lt shoulder grossly.  ?             ?TODAY'S TREATMENT:  ?05/24/2021: ?  Therex: ?   UBE fwd/back 4 mins each way AAROM, Lvl 3.5 ?Pulleys 3 mins flexion ?   Pulleys 3 mins scaption ?   Standing UE [place] flexion attempts x 2 (unable to perform under control - hold at this time) ?   Supine AAROM 2 lb bar flexion x15  to tolerance ?   Supine AROM 0-90 deg ( to tolerance) x 5 ?   Supine 90 deg flexion hold to fatigue Lt -  60 seconds ?   Standing painfree isometric flexion, ER, abd Lt arm 10 sec hold x 8 each way (10 sec on / 5 sec off) ?   ?  Manual: ?   G4 inferior Lt GH joint mobs, Mobilization c movement posterior glide to Lt [place] jt c PROM for ER.  ? ?05/19/2021: ?  Therex: ?   UBE fwd/back 3 mins each way AAROM ?Pulleys 3 mins flexion ?   Pulleys 3 mins scaption ?   Supine AROM ER/IR alternating in 45 deg abduction x 15 (self sustained posterior glide from Rt arm) ?   Supine AAROM 2 lb bar flexion 2x15  to tolerance ?   Supine AROM 0-80 deg ( to tolerance) 2 x 5 ?   Standing wand AAROM abduction 1 lb 2 x 10  ?   Standing painfree isometric flexion, ER Lt arm 5 sec hold x 5 ?   ?  Manual: ?   G4 inferior Lt GH joint mobs, Mobilization c movement posterior glide to Lt [place] jt c PROM for ER.  ? ?05/17/2021: ?  Therex: ?   Pulleys 3 mins flexion ?   Pulleys 3 mins scaption ?   Supine AROM ER/IR alternating in 45 deg abduction x 15 ?   Supine AAROM 1 lb bar flexion 2 x 10 to tolerance ?   Supine wand protraction bilateral 3 sec hold x 15 in 90 deg flexion ?   Standing wand AAROM abduction 1 lb 2 x 10  ?   ?  Manual: ?   G3 inferior Lt GH joint mobs, Mobilization c movement posterior glide to Lt [place] jt c PROM for ER.  ? ?  ?PATIENT EDUCATION: ?05/19/2021 ?Education details: HEP progression.  ?Person educated: Patient ?Education method: Explanation, Demonstration, Verbal cues, and Handouts ?Education comprehension: verbalized understanding and returned demonstration ?  ?  ?HOME EXERCISE PROGRAM: ?Access Code: [email or web address] ?URL: [email or web address] ?Date: 05/19/2021 ?Prepared by: Scot Jun ? ?Exercises ?- Seated Scapular Retraction  - 3-5 x daily - 7 x weekly - 1 sets - 10 reps - 3-5 hold ?- Seated Shoulder Abduction Towel Slide at Table Top  - 2-3 x daily - 6 x weekly - 1-2 sets - 10 reps - 5 hold ?- Supine Shoulder Flexion Extension AAROM with Dowel  - 2 x daily - 7 x weekly - 1-2 sets - 10 reps - 5 hold ?- Supine Shoulder  External Rotation in 45 Degrees Abduction AAROM with Dowel  - 2 x daily - 7 x weekly -  1-2 sets - 10 reps - 5 hold ?- Seated Shoulder Flexion AAROM with Pulley Behind  - 2-3 x daily - 7 x weekly - 1 sets - 3 min

## 2021-05-26 ENCOUNTER — Other Ambulatory Visit: Payer: Self-pay

## 2021-05-26 ENCOUNTER — Encounter: Payer: Self-pay | Admitting: Rehabilitative and Restorative Service Providers"

## 2021-05-26 ENCOUNTER — Ambulatory Visit: Payer: No Typology Code available for payment source | Admitting: Rehabilitative and Restorative Service Providers"

## 2021-05-26 DIAGNOSIS — M25612 Stiffness of left shoulder, not elsewhere classified: Secondary | ICD-10-CM

## 2021-05-26 DIAGNOSIS — M6281 Muscle weakness (generalized): Secondary | ICD-10-CM | POA: Diagnosis not present

## 2021-05-26 DIAGNOSIS — R600 Localized edema: Secondary | ICD-10-CM

## 2021-05-26 DIAGNOSIS — R293 Abnormal posture: Secondary | ICD-10-CM | POA: Diagnosis not present

## 2021-05-26 DIAGNOSIS — G8929 Other chronic pain: Secondary | ICD-10-CM | POA: Diagnosis not present

## 2021-05-26 DIAGNOSIS — M25512 Pain in left shoulder: Secondary | ICD-10-CM

## 2021-05-26 NOTE — Therapy (Signed)
?OUTPATIENT PHYSICAL THERAPY TREATMENT NOTE ? ? ?Patient Name: Dennis Villarreal ?MRN: [id number] ?DOB:[birth year], 71 y.o., male ?Today's Date: 05/26/2021 ? ?PCP: [name], MD ?REFERRING PROVIDER: Meredith Pel, MD ? ?END OF SESSION:  ? PT End of Session - 05/26/21 1025   ? ? Visit Number 9   ? Number of Visits 20   ? Date for PT Re-Evaluation 07/05/21   ? Authorization Type [employer] $40 copay   ? Progress Note Due on Visit 10   ? PT Start Time 1021   ? PT Stop Time 1100   ? PT Time Calculation (min) 39 min   ? Activity Tolerance Patient tolerated treatment well   ? Behavior During Therapy [facility] for tasks assessed/performed   ? ?  ?  ? ?  ? ? ? ? ? ? ? ? ?Past Medical History:  ?Diagnosis Date  ? ALLERGIC RHINITIS 09/04/2009  ? Basal cell carcinoma   ? Cancer [facility]) 2012  ? melanoma  ? DYSLIPIDEMIA 09/04/2009  ? GERD 09/04/2009  ? High cholesterol   ? just above borderline per patient   ? ?Past Surgical History:  ?Procedure Laterality Date  ? GALLBLADDER SURGERY  01/2018  ? SHOULDER ARTHROSCOPY WITH OPEN ROTATOR CUFF REPAIR AND DISTAL CLAVICLE ACROMINECTOMY Left 04/13/2021  ? Procedure: LEFT SHOULDER ARTHROSCOPY, DEBRIDEMENT, MINI OPEN ROTATOR CUFF TEAR REPAIR, BICEPS TENODESIS;  Surgeon: Meredith Pel, MD;  Location: [place];  Service: Orthopedics;  Laterality: Left;  ? [place], 2009  ? left shoulder scromoplasty  ? TONSILLECTOMY    ? TRIGGER FINGER RELEASE Left   ? ?Patient Active Problem List  ? Diagnosis Date Noted  ? Complete tear of left rotator cuff   ? Biceps tendonitis on left   ? Degenerative superior labral anterior-to-posterior (SLAP) tear of left shoulder   ? BPH (benign prostatic hyperplasia) 05/24/2017  ? Diplopia 03/18/2014  ? Dizziness and giddiness 03/18/2014  ? Vision loss 03/18/2014  ? Sinusitis 02/03/2013  ? Anxiety and depression 09/13/2012  ? History of melanoma 06/30/2011  ? Hyperlipidemia 09/04/2009  ? ALLERGIC RHINITIS 09/04/2009  ? GERD 09/04/2009   ? ? ?THERAPY DIAG:  ?Chronic left shoulder pain ? ?Stiffness of left shoulder, not elsewhere classified ? ?Muscle weakness (generalized) ? ?Localized edema ? ?Abnormal posture ? ?  ?REFERRING DIAG: Z98.890 (ICD-10-CM) - History of arthroscopy of left shoulder ?K44.010 (ICD-10-CM) - S/P rotator cuff repair ? ?PERTINENT HISTORY: ?Had surgery 04/13/2021 for rotator cuff repair. History of melanoma  ?  ?ONSET DATE: Surgery 04/13/2021 ?  ?SUBJECTIVE:                                                                                                                                                                                     ?  ?  SUBJECTIVE STATEMENT:  Pt indicated a little pain today, 3/10.  Indicated he has been doing better with use of arm during the day.  Pt stated he did some painting at home that may influence feelings.  ? ?PAIN:  ? ?NPRS scale: 3/10 upon arrival ?Pain location: Lt shoulder  ?Pain description: achy ?Aggravating factors:end range, nighttime pain, after painting.  ?Relieving factors: pain medicine, OTC medicine ?  ?PRECAUTIONS: AROM progression.  No strengthening with resistance until 05/25/2021. (Updated from MD office visit on 05/05/2021 ? 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  Various lifting/carrying activity.  ?  ?PLOF: Independent , yard care, Rt hand dominant ?  ?PATIENT GOALS Reduce pain, get back to work and hobbies ?  ?OBJECTIVE:  ?  ?PATIENT SURVEYS:  ? 04/26/2021: FOTO intake:  36 predicted: 59  ?  ?COGNITION: ?04/26/2021: Overall cognitive status: Within functional limits for tasks assessed ?                                    ?SENSATION: ? 04/26/2021 WFL ?  ?Edema:  04/26/2021: localized edema Lt shoulder ?  ?POSTURE: ?05/24/2021: no sling use ? ?04/26/2021 Wearing sling  ?  ?UPPER EXTREMITY ROM:  ?  ?ROM Right ?04/26/2021 Left PROM ?04/26/2021 Left PROM  ?in supine ?05/03/21 Left PROM ?05/10/2021 ? Left PROM ?05/17/2021  ?Shoulder flexion    95 PROM in supine, pain at end range 100 (muscle guarding and pain noted) 110 deg in supine 130 in supine  ?Shoulder extension         ?Shoulder abduction   90 PROM in supine, pain at end range 100  111 in supine  ?Shoulder adduction         ?Shoulder internal rotation   To belly  To belly    ?Shoulder external rotation   0 PROM in supine, pain at end range 10 PROM in 45 deg abduction ?32 deg 48 deg in 45 deg abduction supine  ?Elbow flexion         ?Elbow extension         ?Wrist flexion         ?Wrist extension         ?Wrist ulnar deviation         ?Wrist radial deviation         ?Wrist pronation         ?Wrist supination         ?(Blank rows = not tested) ?  ?UPPER EXTREMITY MMT: ?  ?MMT Right ?04/26/2021 Left ?04/26/2021 ?Not tested due to surgery protocol Left ?05/10/2021 Left ?05/24/2021  ?Shoulder flexion 5/5   1/5 2-/5  ?Shoulder extension        ?Shoulder abduction 5/5   1/5 2-/5  ?Shoulder adduction        ?Shoulder internal rotation 5/5      ?Shoulder external rotation 5/5      ?Middle trapezius        ?Lower trapezius        ?Elbow flexion 5/5      ?Elbow extension 5/5      ?Wrist flexion        ?Wrist extension        ?Wrist ulnar deviation        ?Wrist radial deviation        ?Wrist pronation        ?Wrist  supination        ?Grip strength (lbs)        ?(Blank rows = not tested) ?  ?JOINT MOBILITY TESTING:  ?04/26/2021 : early to mid range Lt Gh joint mobility assessment was normal for capsular mobility. Guarding was noted from musculature.  ?  ?PALPATION:  ?04/26/2021 : mild tenderness surrounding Lt shoulder grossly.  ?             ?TODAY'S TREATMENT:  ?05/26/2021: ?  Therex: ?   UBE fwd/back 4 mins each way AAROM, Lvl 3.5 ?   Supine 1 lb bar AAROM protraction 3 sec hold x 10  - Lt am majority ?   Supine AAROM 1 lb bar flexion 2 x 15 full range c focus on using Lt arm majority ?   Supine 90 deg flexion hold to fatigue Lt - 60 seconds ?   Standing painfree isometric flexion, ER, abd Lt arm 10 sec hold x  3 ?   Cross arm stretch 15 sec x 3 Lt arm ?   ?  Manual: ?   G2 inferior Lt GH joint mobs, Mobilization c movement posterior glide to Lt [place] jt c PROM for ER. ? ? ?05/24/2021: ?  Therex: ?   UBE fwd/back 4 mins each way AAROM, Lvl 3.5 ?Pulleys 3 mins flexion ?   Pulleys 3 mins scaption ?   Standing UE ranger flexion attempts x 2 (unable to perform under control - hold at this time) ?   Supine AAROM 2 lb bar flexion x15  to tolerance ?   Supine AROM 0-90 deg ( to tolerance) x 5 ?   Supine 90 deg flexion hold to fatigue Lt - 60 seconds ?   Standing painfree isometric flexion, ER, abd Lt arm 10 sec hold x 8 each way (10 sec on / 5 sec off) ?   ?  Manual: ?   G4 inferior Lt GH joint mobs, Mobilization c movement posterior glide to Lt [place] jt c PROM for ER.  ? ?05/19/2021: ?  Therex: ?   UBE fwd/back 3 mins each way AAROM ?Pulleys 3 mins flexion ?   Pulleys 3 mins scaption ?   Supine AROM ER/IR alternating in 45 deg abduction x 15 (self sustained posterior glide from Rt arm) ?   Supine AAROM 2 lb bar flexion 2x15  to tolerance ?   Supine AROM 0-80 deg ( to tolerance) 2 x 5 ?   Standing wand AAROM abduction 1 lb 2 x 10  ?   Standing painfree isometric flexion, ER Lt arm 5 sec hold x 5 ?   ?  Manual: ?   G4 inferior Lt GH joint mobs, Mobilization c movement posterior glide to Lt [place] jt c PROM for ER.  ? ? ?PATIENT EDUCATION: ?05/19/2021 ?Education details: HEP progression.  ?Person educated: Patient ?Education method: Explanation, Demonstration, Verbal cues, and Handouts ?Education comprehension: verbalized understanding and returned demonstration ?  ?  ?HOME EXERCISE PROGRAM: ?Access Code: [email or web address] ?URL: [email or web address] ?Date: 05/19/2021 ?Prepared by: Scot Jun ? ?Exercises ?- Seated Scapular Retraction  - 3-5 x daily - 7 x weekly - 1 sets - 10 reps - 3-5 hold ?- Seated Shoulder Abduction Towel Slide at Table Top  - 2-3 x daily - 6 x weekly - 1-2 sets - 10 reps - 5 hold ?- Supine Shoulder Flexion  Extension AAROM with [place]  - 2 x daily - 7 x weekly - 1-2 sets - 10 reps -  5 hold ?- Supine Shoulder External Rotation in 45 Degrees Abduction AAROM with [place]  - 2 x daily - 7 x weekly - 1-2 sets - 10 reps - 5 hold ?- Seat

## 2021-05-28 ENCOUNTER — Other Ambulatory Visit: Payer: Self-pay | Admitting: Surgical

## 2021-05-28 ENCOUNTER — Telehealth: Payer: Self-pay | Admitting: Orthopedic Surgery

## 2021-05-28 MED ORDER — HYDROCODONE-ACETAMINOPHEN 5-325 MG PO TABS: 1.0000 | ORAL_TABLET | Freq: Every day | ORAL | 0 refills | Status: DC | PRN

## 2021-05-28 NOTE — Telephone Encounter (Signed)
I called patient and advised. 

## 2021-05-28 NOTE — Telephone Encounter (Signed)
Sent in refill for hydrocodone to just take once daily as needed.  This should be the last refill of the pain medicine

## 2021-05-28 NOTE — Telephone Encounter (Signed)
Patient called needing Rx refilled Hydrocodone. The number to contact patient is [phone] ?

## 2021-05-28 NOTE — Telephone Encounter (Signed)
Please advise 

## 2021-06-01 ENCOUNTER — Encounter: Payer: Self-pay | Admitting: Physical Therapy

## 2021-06-01 ENCOUNTER — Ambulatory Visit: Payer: No Typology Code available for payment source | Admitting: Physical Therapy

## 2021-06-01 DIAGNOSIS — M25512 Pain in left shoulder: Secondary | ICD-10-CM

## 2021-06-01 DIAGNOSIS — R600 Localized edema: Secondary | ICD-10-CM

## 2021-06-01 DIAGNOSIS — G8929 Other chronic pain: Secondary | ICD-10-CM | POA: Diagnosis not present

## 2021-06-01 DIAGNOSIS — M25612 Stiffness of left shoulder, not elsewhere classified: Secondary | ICD-10-CM | POA: Diagnosis not present

## 2021-06-01 DIAGNOSIS — M6281 Muscle weakness (generalized): Secondary | ICD-10-CM | POA: Diagnosis not present

## 2021-06-01 NOTE — Therapy (Signed)
?OUTPATIENT PHYSICAL THERAPY TREATMENT NOTE ? ?Progress Note reporting period date 04/26/21 to 06/01/21 ? ?See below for objective and subjective measurements relating to patients progress with PT. ? ? ? ?Patient Name: Dennis Villarreal ?MRN: [id number] ?DOB:11/23/1950, 71 y.o., male ?Today's Date: 06/01/2021 ? ?PCP: [name], MD ?REFERRING PROVIDER: Meredith Pel, MD ? ?END OF SESSION:  ? PT End of Session - 06/01/21 1355   ? ? Visit Number 10   ? Number of Visits 20   ? Date for PT Re-Evaluation 07/05/21   ? Authorization Type [employer] $40 copay   ? Progress Note Due on Visit 20   ? PT Start Time 1346   ? PT Stop Time 1425   ? PT Time Calculation (min) 39 min   ? Activity Tolerance Patient tolerated treatment well   ? Behavior During Therapy [facility] for tasks assessed/performed   ? ?  ?  ? ?  ? ? ? ? ? ? ? ? ?Past Medical History:  ?Diagnosis Date  ? ALLERGIC RHINITIS 09/04/2009  ? Basal cell carcinoma   ? Cancer [facility]) 2012  ? melanoma  ? DYSLIPIDEMIA 09/04/2009  ? GERD 09/04/2009  ? High cholesterol   ? just above borderline per patient   ? ?Past Surgical History:  ?Procedure Laterality Date  ? GALLBLADDER SURGERY  01/2018  ? SHOULDER ARTHROSCOPY WITH OPEN ROTATOR CUFF REPAIR AND DISTAL CLAVICLE ACROMINECTOMY Left 04/13/2021  ? Procedure: LEFT SHOULDER ARTHROSCOPY, DEBRIDEMENT, MINI OPEN ROTATOR CUFF TEAR REPAIR, BICEPS TENODESIS;  Surgeon: Meredith Pel, MD;  Location: [place];  Service: Orthopedics;  Laterality: Left;  ? [place], 2009  ? left shoulder scromoplasty  ? TONSILLECTOMY    ? TRIGGER FINGER RELEASE Left   ? ?Patient Active Problem List  ? Diagnosis Date Noted  ? Complete tear of left rotator cuff   ? Biceps tendonitis on left   ? Degenerative superior labral anterior-to-posterior (SLAP) tear of left shoulder   ? BPH (benign prostatic hyperplasia) 05/24/2017  ? Diplopia 03/18/2014  ? Dizziness and giddiness 03/18/2014  ? Vision loss 03/18/2014  ? Sinusitis 02/03/2013  ?  Anxiety and depression 09/13/2012  ? History of melanoma 06/30/2011  ? Hyperlipidemia 09/04/2009  ? ALLERGIC RHINITIS 09/04/2009  ? GERD 09/04/2009  ? ? ?THERAPY DIAG:  ?Chronic left shoulder pain ? ?Stiffness of left shoulder, not elsewhere classified ? ?Muscle weakness (generalized) ? ?Localized edema ? ?  ?REFERRING DIAG: Z98.890 (ICD-10-CM) - History of arthroscopy of left shoulder ?N82.956 (ICD-10-CM) - S/P rotator cuff repair ? ?PERTINENT HISTORY: ?Had surgery 04/13/2021 for rotator cuff repair. History of melanoma  ?  ?ONSET DATE: Surgery 04/13/2021 ?  ?SUBJECTIVE:                                                                                                                                                                                     ?  ?  SUBJECTIVE STATEMENT:  he notes some improvements, has been working with his pulleys at home for ROM  ? ?PAIN:  ? ?NPRS scale: 3/10 upon arrival ?Pain location: Lt shoulder  ?Pain description: achy ?Aggravating factors:end range, nighttime pain, after painting.  ?Relieving factors: pain medicine, OTC medicine ?  ?PRECAUTIONS: AROM progression.  No strengthening with resistance until 05/25/2021. (Updated from MD office visit on 05/05/2021 ? 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  ?  ?PLOF: Independent , yard care, Rt hand dominant ?  ?PATIENT GOALS Reduce pain, get back to work and hobbies ?  ?OBJECTIVE:  ?  ?PATIENT SURVEYS:  ? 04/26/2021: FOTO intake:  36 predicted: 59  ?  ?COGNITION: ?04/26/2021: Overall cognitive status: Within functional limits for tasks assessed ?                                    ?SENSATION: ? 04/26/2021 WFL ?  ?Edema:  04/26/2021: localized edema Lt shoulder ?  ?POSTURE: ?05/24/2021: no sling use ? ?04/26/2021 Wearing sling  ?  ?UPPER EXTREMITY ROM:  ?  ?ROM Right ?04/26/2021 Left PROM ?04/26/2021 Left PROM  ?in supine ?05/03/21 Left PROM ?05/10/2021 ? Left  PROM ?05/17/2021 Left PROM ?06/01/21  ?Shoulder flexion   95 PROM in supine, pain at end range 100 (muscle guarding and pain noted) 110 deg in supine 130 in supine 150 in supine  ?Shoulder extension          ?Shoulder abduction   90 PROM in supine, pain at end range 100  111 in supine 160 in supine and slight scaption  ?Shoulder adduction          ?Shoulder internal rotation   To belly  To belly     ?Shoulder external rotation   0 PROM in supine, pain at end range 10 PROM in 45 deg abduction ?32 deg 48 deg in 45 deg abduction supine 65 deg in 45 deg abd supine ?  ?Elbow flexion          ?Elbow extension          ?Wrist flexion          ?Wrist extension          ?Wrist ulnar deviation          ?Wrist radial deviation          ?Wrist pronation          ?Wrist supination          ?(Blank rows = not tested) ?  ?UPPER EXTREMITY MMT: ?  ?MMT Right ?04/26/2021 Left ?04/26/2021 ?Not tested due to surgery protocol Left ?05/10/2021 Left ?05/24/2021  ?Shoulder flexion 5/5   1/5 2-/5  ?Shoulder extension        ?Shoulder abduction 5/5   1/5 2-/5  ?Shoulder adduction        ?Shoulder internal rotation 5/5      ?Shoulder external rotation 5/5      ?Middle trapezius        ?Lower trapezius        ?Elbow flexion 5/5      ?Elbow extension 5/5      ?Wrist flexion        ?Wrist extension        ?Wrist ulnar deviation        ?Wrist radial deviation        ?  Wrist pronation        ?Wrist supination        ?Grip strength (lbs)        ?(Blank rows = not tested) ?  ?JOINT MOBILITY TESTING:  ?04/26/2021 : early to mid range Lt Gh joint mobility assessment was normal for capsular mobility. Guarding was noted from musculature.  ?  ?PALPATION:  ?04/26/2021 : mild tenderness surrounding Lt shoulder grossly.  ?             ?TODAY'S TREATMENT:  ?05/31/2021: ?Therex: ? UBE fwd/back 4 mins each way AAROM, Lvl 3.5 ? Supine 2 lb bar AAROM protraction 3 sec hold x 10  - Lt am majority ? Supine AAROM 2 lb bar flexion X10 full range c focus on using Lt arm  majority ? Supine AAROM 2# bar ER X 10 ? Standing AAROM flexion with hands clasped X 10 (cues to avoid shrug) ? Standing painfree isometric flexion, ER, abd Lt arm 5 sec hold x 10 ?   ? Manual: ? G2-3 inferior Lt GH joint mobs and posterior joint mobs. Lt shoulder PROM to tolerance all planes ? ?05/26/2021: ?  Therex: ?   UBE fwd/back 4 mins each way AAROM, Lvl 3.5 ?   Supine 1 lb bar AAROM protraction 3 sec hold x 10  - Lt am majority ?   Supine AAROM 1 lb bar flexion 2 x 15 full range c focus on using Lt arm majority ?   Supine 90 deg flexion hold to fatigue Lt - 60 seconds ?   Standing painfree isometric flexion, ER, abd Lt arm 10 sec hold x 3 ?   Cross arm stretch 15 sec x 3 Lt arm ?   ?  Manual: ?   G2 inferior Lt GH joint mobs, Mobilization c movement posterior glide to Lt [place] jt c PROM for ER. ? ? ?05/24/2021: ?  Therex: ?   UBE fwd/back 4 mins each way AAROM, Lvl 3.5 ?Pulleys 3 mins flexion ?   Pulleys 3 mins scaption ?   Standing UE ranger flexion attempts x 2 (unable to perform under control - hold at this time) ?   Supine AAROM 2 lb bar flexion x15  to tolerance ?   Supine AROM 0-90 deg ( to tolerance) x 5 ?   Supine 90 deg flexion hold to fatigue Lt - 60 seconds ?   Standing painfree isometric flexion, ER, abd Lt arm 10 sec hold x 8 each way (10 sec on / 5 sec off) ?   ?  Manual: ?   G4 inferior Lt GH joint mobs, Mobilization c movement posterior glide to Lt [place] jt c PROM for ER.  ? ?05/19/2021: ?  Therex: ?   UBE fwd/back 3 mins each way AAROM ?Pulleys 3 mins flexion ?   Pulleys 3 mins scaption ?   Supine AROM ER/IR alternating in 45 deg abduction x 15 (self sustained posterior glide from Rt arm) ?   Supine AAROM 2 lb bar flexion 2x15  to tolerance ?   Supine AROM 0-80 deg ( to tolerance) 2 x 5 ?   Standing wand AAROM abduction 1 lb 2 x 10  ?   Standing painfree isometric flexion, ER Lt arm 5 sec hold x 5 ?   ?  Manual: ?   G4 inferior Lt GH joint mobs, Mobilization c movement posterior glide to Lt [place] jt c  PROM for ER.  ? ? ?PATIENT EDUCATION: ?05/19/2021 ?Education  details: HEP progression.  ?Person educated: Patient ?Education method: Explanation, Demonstration, Verbal cues, and Handouts ?Education comprehension: verbalized und

## 2021-06-04 ENCOUNTER — Ambulatory Visit: Payer: No Typology Code available for payment source | Admitting: Rehabilitative and Restorative Service Providers"

## 2021-06-04 ENCOUNTER — Encounter: Payer: Self-pay | Admitting: Rehabilitative and Restorative Service Providers"

## 2021-06-04 DIAGNOSIS — G8929 Other chronic pain: Secondary | ICD-10-CM | POA: Diagnosis not present

## 2021-06-04 DIAGNOSIS — M25612 Stiffness of left shoulder, not elsewhere classified: Secondary | ICD-10-CM

## 2021-06-04 DIAGNOSIS — R293 Abnormal posture: Secondary | ICD-10-CM

## 2021-06-04 DIAGNOSIS — R600 Localized edema: Secondary | ICD-10-CM

## 2021-06-04 DIAGNOSIS — M6281 Muscle weakness (generalized): Secondary | ICD-10-CM

## 2021-06-04 DIAGNOSIS — M25512 Pain in left shoulder: Secondary | ICD-10-CM | POA: Diagnosis not present

## 2021-06-04 NOTE — Therapy (Signed)
?OUTPATIENT PHYSICAL THERAPY TREATMENT NOTE ? ? ?Patient Name: Dennis Villarreal ?MRN: [id number] ?DOB:05/29/1950, 71 y.o., male ?Today's Date: 06/04/2021 ? ?PCP: [name], MD ?REFERRING PROVIDER: Meredith Pel, MD ? ?END OF SESSION:  ? PT End of Session - 06/04/21 1123   ? ? Visit Number 11   ? Number of Visits 20   ? Date for PT Re-Evaluation 07/05/21   ? Authorization Type [employer] $40 copay   ? Progress Note Due on Visit 20   ? PT Start Time 1056   ? PT Stop Time 1136   ? PT Time Calculation (min) 40 min   ? Activity Tolerance Patient tolerated treatment well   ? Behavior During Therapy [facility] for tasks assessed/performed   ? ?  ?  ? ?  ? ? ? ? ? ? ? ? ? ?Past Medical History:  ?Diagnosis Date  ? ALLERGIC RHINITIS 09/04/2009  ? Basal cell carcinoma   ? Cancer [facility]) 2012  ? melanoma  ? DYSLIPIDEMIA 09/04/2009  ? GERD 09/04/2009  ? High cholesterol   ? just above borderline per patient   ? ?Past Surgical History:  ?Procedure Laterality Date  ? GALLBLADDER SURGERY  01/2018  ? SHOULDER ARTHROSCOPY WITH OPEN ROTATOR CUFF REPAIR AND DISTAL CLAVICLE ACROMINECTOMY Left 04/13/2021  ? Procedure: LEFT SHOULDER ARTHROSCOPY, DEBRIDEMENT, MINI OPEN ROTATOR CUFF TEAR REPAIR, BICEPS TENODESIS;  Surgeon: Meredith Pel, MD;  Location: [place];  Service: Orthopedics;  Laterality: Left;  ? [place], 2009  ? left shoulder scromoplasty  ? TONSILLECTOMY    ? TRIGGER FINGER RELEASE Left   ? ?Patient Active Problem List  ? Diagnosis Date Noted  ? Complete tear of left rotator cuff   ? Biceps tendonitis on left   ? Degenerative superior labral anterior-to-posterior (SLAP) tear of left shoulder   ? BPH (benign prostatic hyperplasia) 05/24/2017  ? Diplopia 03/18/2014  ? Dizziness and giddiness 03/18/2014  ? Vision loss 03/18/2014  ? Sinusitis 02/03/2013  ? Anxiety and depression 09/13/2012  ? History of melanoma 06/30/2011  ? Hyperlipidemia 09/04/2009  ? ALLERGIC RHINITIS 09/04/2009  ? GERD 09/04/2009   ? ? ?THERAPY DIAG:  ?Chronic left shoulder pain ? ?Stiffness of left shoulder, not elsewhere classified ? ?Muscle weakness (generalized) ? ?Localized edema ? ?Abnormal posture ? ?  ?REFERRING DIAG: Z98.890 (ICD-10-CM) - History of arthroscopy of left shoulder ?J00.938 (ICD-10-CM) - S/P rotator cuff repair ? ?PERTINENT HISTORY: ?Had surgery 04/13/2021 for rotator cuff repair. History of melanoma  ?  ?ONSET DATE: Surgery 04/13/2021 ?  ?SUBJECTIVE:                                                                                                                                                                                     ?  ?  SUBJECTIVE STATEMENT:  Pt indicated 3/10 today.  Felt like he has been using and working arm in normal life more that may impact his feeling today.  ? ?PAIN:  ? ?NPRS scale: 3/10 upon arrival ?Pain location: Lt shoulder  ?Pain description: achy ?Aggravating factors:end range, nighttime pain, after painting.  ?Relieving factors: pain medicine, OTC medicine ?  ?PRECAUTIONS: AROM progression.  No strengthening with resistance until 05/25/2021. (Updated from MD office visit on 05/05/2021 ? 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  Various lifting/carrying activity.  ?  ?PLOF: Independent , yard care, Rt hand dominant ?  ?PATIENT GOALS Reduce pain, get back to work and hobbies ?  ?OBJECTIVE:  ?  ?PATIENT SURVEYS:  ?06/04/2021:  FOTO update:   60 ? ?04/26/2021: FOTO intake:  36 predicted: 59  ?  ?COGNITION: ?04/26/2021: Overall cognitive status: Within functional limits for tasks assessed ?                                    ?SENSATION: ? 04/26/2021 WFL ?  ?Edema:  04/26/2021: localized edema Lt shoulder ?  ?POSTURE: ?05/24/2021: no sling use ? ?04/26/2021 Wearing sling  ?  ?UPPER EXTREMITY ROM:  ?  ?ROM Right ?04/26/2021 Left PROM ?04/26/2021 Left PROM  ?in supine ?05/03/21 Left PROM ?05/10/2021 ? Left PROM ?05/17/2021 Left PROM ?06/01/21  ?Shoulder flexion    95 PROM in supine, pain at end range 100 (muscle guarding and pain noted) 110 deg in supine 130 in supine 150 in supine  ?Shoulder extension          ?Shoulder abduction   90 PROM in supine, pain at end range 100  111 in supine 160 in supine and slight scaption  ?Shoulder adduction          ?Shoulder internal rotation   To belly  To belly     ?Shoulder external rotation   0 PROM in supine, pain at end range 10 PROM in 45 deg abduction ?32 deg 48 deg in 45 deg abduction supine 65 deg in 45 deg abd supine ?  ?Elbow flexion          ?Elbow extension          ?Wrist flexion          ?Wrist extension          ?Wrist ulnar deviation          ?Wrist radial deviation          ?Wrist pronation          ?Wrist supination          ?(Blank rows = not tested) ?  ?UPPER EXTREMITY MMT: ?  ?MMT Right ?04/26/2021 Left ?04/26/2021 ?Not tested due to surgery protocol Left ?05/10/2021 Left ?05/24/2021  ?Shoulder flexion 5/5   1/5 2-/5  ?Shoulder extension        ?Shoulder abduction 5/5   1/5 2-/5  ?Shoulder adduction        ?Shoulder internal rotation 5/5      ?Shoulder external rotation 5/5      ?Middle trapezius        ?Lower trapezius        ?Elbow flexion 5/5      ?Elbow extension 5/5      ?Wrist flexion        ?Wrist extension        ?Wrist ulnar  deviation        ?Wrist radial deviation        ?Wrist pronation        ?Wrist supination        ?Grip strength (lbs)        ?(Blank rows = not tested) ?  ?JOINT MOBILITY TESTING:  ?04/26/2021 : early to mid range Lt Gh joint mobility assessment was normal for capsular mobility. Guarding was noted from musculature.  ?  ?PALPATION:  ?04/26/2021 : mild tenderness surrounding Lt shoulder grossly.  ?             ?TODAY'S TREATMENT:  ?06/04/2021: ?Therex: ? UBE fwd/back 4 mins each way AAROM, Lvl 3.5 ? Supine green band horizontal abduction 2 x 10 ? Supine Lt arm AROM flexion 2 x 10 ?  ? Standing AAROM flexion with hands clasped X 10 (cues to avoid shrug) ? Standing painfree isometric flexion, ER, abd Lt  arm 5 sec hold x 10 ? ?Neuro Re-ed ? Stabilizations in 90 deg flexion c mild clinician force in multiple directions, bouts 15-20 seconds ?   ?Manual: ? G2-3 inferior Lt GH joint mobs and posterior joint mobs. Lt shoulder PROM to tolerance all planes ? ?05/31/2021: ?Therex: ? UBE fwd/back 4 mins each way AAROM, Lvl 3.5 ? Supine 2 lb bar AAROM protraction 3 sec hold x 10  - Lt am majority ? Supine AAROM 2 lb bar flexion X10 full range c focus on using Lt arm majority ? Supine AAROM 2# bar ER X 10 ? Standing AAROM flexion with hands clasped X 10 (cues to avoid shrug) ? Standing painfree isometric flexion, ER, abd Lt arm 5 sec hold x 10 ?   ? Manual: ? G2-3 inferior Lt GH joint mobs and posterior joint mobs. Lt shoulder PROM to tolerance all planes ? ?05/26/2021: ?  Therex: ?   UBE fwd/back 4 mins each way AAROM, Lvl 3.5 ?   Supine 1 lb bar AAROM protraction 3 sec hold x 10  - Lt am majority ?   Supine AAROM 1 lb bar flexion 2 x 15 full range c focus on using Lt arm majority ?   Supine 90 deg flexion hold to fatigue Lt - 60 seconds ?   Standing painfree isometric flexion, ER, abd Lt arm 10 sec hold x 3 ?   Cross arm stretch 15 sec x 3 Lt arm ?   ?  Manual: ?   G2 inferior Lt GH joint mobs, Mobilization c movement posterior glide to Lt [place] jt c PROM for ER. ? ? ?PATIENT EDUCATION: ?05/19/2021 ?Education details: HEP progression.  ?Person educated: Patient ?Education method: Explanation, Demonstration, Verbal cues, and Handouts ?Education comprehension: verbalized understanding and returned demonstration ?  ?  ?HOME EXERCISE PROGRAM: ?06/04/2021 ?Access Code: [email or web address] ?URL: [email or web address] ?Date: 06/04/2021 ?Prepared by: Scot Jun ? ?Exercises ?- Supine Shoulder External Rotation in 45 Degrees Abduction AAROM with Dowel  - 2 x daily - 7 x weekly - 1-2 sets - 10 reps - 5 hold ?- Seated Shoulder Flexion AAROM with Pulley Behind  - 2-3 x daily - 7 x weekly - 1 sets - 3 mins hold ?- Seated Shoulder Scaption  AAROM with Pulley at Side  - 2-3 x daily - 7 x weekly - 1 sets - 3 min hold ?- Isometric Shoulder Flexion at Wall  - 2 x daily - 7 x weekly - 1 sets - 10-15 reps - 5 hold ?- Standing Isometric Shoulder Ext

## 2021-06-07 ENCOUNTER — Encounter: Payer: Self-pay | Admitting: Rehabilitative and Restorative Service Providers"

## 2021-06-07 ENCOUNTER — Ambulatory Visit: Payer: No Typology Code available for payment source | Admitting: Rehabilitative and Restorative Service Providers"

## 2021-06-07 DIAGNOSIS — R293 Abnormal posture: Secondary | ICD-10-CM

## 2021-06-07 DIAGNOSIS — M25612 Stiffness of left shoulder, not elsewhere classified: Secondary | ICD-10-CM | POA: Diagnosis not present

## 2021-06-07 DIAGNOSIS — M6281 Muscle weakness (generalized): Secondary | ICD-10-CM

## 2021-06-07 DIAGNOSIS — G8929 Other chronic pain: Secondary | ICD-10-CM | POA: Diagnosis not present

## 2021-06-07 DIAGNOSIS — M25512 Pain in left shoulder: Secondary | ICD-10-CM | POA: Diagnosis not present

## 2021-06-07 DIAGNOSIS — R600 Localized edema: Secondary | ICD-10-CM | POA: Diagnosis not present

## 2021-06-07 NOTE — Therapy (Signed)
?OUTPATIENT PHYSICAL THERAPY TREATMENT NOTE ? ? ?Patient Name: Dennis Villarreal ?MRN: [id number] ?DOB:08/30/1950, 71 y.o., male ?Today's Date: 06/07/2021 ? ?PCP: [name], MD ?REFERRING PROVIDER: [name], MD ? ?END OF SESSION:  ? PT End of Session - 06/07/21 7829   ? ? Visit Number 12   ? Number of Visits 20   ? Date for PT Re-Evaluation 07/05/21   ? Authorization Type [employer] $40 copay   ? Progress Note Due on Visit 20   ? PT Start Time 0845   ? PT Stop Time 5621   ? PT Time Calculation (min) 50 min   ? Activity Tolerance Patient tolerated treatment well   ? Behavior During Therapy [facility] for tasks assessed/performed   ? ?  ?  ? ?  ? ? ? ? ? ? ? ? ? ? ?Past Medical History:  ?Diagnosis Date  ? ALLERGIC RHINITIS 09/04/2009  ? Basal cell carcinoma   ? Cancer [facility]) 2012  ? melanoma  ? DYSLIPIDEMIA 09/04/2009  ? GERD 09/04/2009  ? High cholesterol   ? just above borderline per patient   ? ?Past Surgical History:  ?Procedure Laterality Date  ? GALLBLADDER SURGERY  01/2018  ? SHOULDER ARTHROSCOPY WITH OPEN ROTATOR CUFF REPAIR AND DISTAL CLAVICLE ACROMINECTOMY Left 04/13/2021  ? Procedure: LEFT SHOULDER ARTHROSCOPY, DEBRIDEMENT, MINI OPEN ROTATOR CUFF TEAR REPAIR, BICEPS TENODESIS;  Surgeon: Meredith Pel, MD;  Location: [place];  Service: Orthopedics;  Laterality: Left;  ? [place], 2009  ? left shoulder scromoplasty  ? TONSILLECTOMY    ? TRIGGER FINGER RELEASE Left   ? ?Patient Active Problem List  ? Diagnosis Date Noted  ? Complete tear of left rotator cuff   ? Biceps tendonitis on left   ? Degenerative superior labral anterior-to-posterior (SLAP) tear of left shoulder   ? BPH (benign prostatic hyperplasia) 05/24/2017  ? Diplopia 03/18/2014  ? Dizziness and giddiness 03/18/2014  ? Vision loss 03/18/2014  ? Sinusitis 02/03/2013  ? Anxiety and depression 09/13/2012  ? History of melanoma 06/30/2011  ? Hyperlipidemia 09/04/2009  ? ALLERGIC RHINITIS 09/04/2009  ? GERD 09/04/2009   ? ? ?THERAPY DIAG:  ?Chronic left shoulder pain ? ?Stiffness of left shoulder, not elsewhere classified ? ?Muscle weakness (generalized) ? ?Localized edema ? ?Abnormal posture ? ?  ?REFERRING DIAG: Z98.890 (ICD-10-CM) - History of arthroscopy of left shoulder ?H08.657 (ICD-10-CM) - S/P rotator cuff repair ? ?PERTINENT HISTORY: ?Had surgery 04/13/2021 for rotator cuff repair. History of melanoma  ?  ?ONSET DATE: Surgery 04/13/2021 ?  ?SUBJECTIVE:                                                                                                                                                                                      ?  SUBJECTIVE STATEMENT:  Pt indicated having some pains at times, noted at rest.   ? ?PAIN:  ? ?NPRS scale: 3/10 upon arrival ?Pain location: Lt shoulder  ?Pain description: achy ?Aggravating factors: achy after exercise ?Relieving factors: nothing specific  ?  ?PRECAUTIONS: AROM progression.  No strengthening with resistance until 05/25/2021. (Updated from MD office visit on 05/05/2021 ? 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  Various lifting/carrying activity.  ?  ?PLOF: Independent , yard care, Rt hand dominant ?  ?PATIENT GOALS Reduce pain, get back to work and hobbies ?  ?OBJECTIVE:  ?  ?PATIENT SURVEYS:  ?06/04/2021:  FOTO update:   60 ? ?04/26/2021: FOTO intake:  36 predicted: 59  ?  ?COGNITION: ?04/26/2021: Overall cognitive status: Within functional limits for tasks assessed ?                                    ?SENSATION: ? 04/26/2021 WFL ?  ?Edema:  04/26/2021: localized edema Lt shoulder ?  ?POSTURE: ?05/24/2021: no sling use ? ?04/26/2021 Wearing sling  ?  ?UPPER EXTREMITY ROM:  ?  ?ROM Right ?04/26/2021 Left PROM ?04/26/2021 Left PROM  ?in supine ?05/03/21 Left PROM ?05/10/2021 ? Left PROM ?05/17/2021 Left PROM ?06/01/21  ?Shoulder flexion   95 PROM in supine, pain at end range 100 (muscle guarding and pain noted) 110 deg in supine 130 in  supine 150 in supine  ?Shoulder extension          ?Shoulder abduction   90 PROM in supine, pain at end range 100  111 in supine 160 in supine and slight scaption  ?Shoulder adduction          ?Shoulder internal rotation   To belly  To belly     ?Shoulder external rotation   0 PROM in supine, pain at end range 10 PROM in 45 deg abduction ?32 deg 48 deg in 45 deg abduction supine 65 deg in 45 deg abd supine ?  ?Elbow flexion          ?Elbow extension          ?Wrist flexion          ?Wrist extension          ?Wrist ulnar deviation          ?Wrist radial deviation          ?Wrist pronation          ?Wrist supination          ?(Blank rows = not tested) ?  ?UPPER EXTREMITY MMT: ?  ?MMT Right ?04/26/2021 Left ?04/26/2021 ?Not tested due to surgery protocol Left ?05/10/2021 Left ?05/24/2021 Left ?06/07/2021  ?Shoulder flexion 5/5   1/5 2-/5   ?Shoulder extension         ?Shoulder abduction 5/5   1/5 2-/5   ?Shoulder adduction         ?Shoulder internal rotation 5/5       ?Shoulder external rotation 5/5     13.3, 9.7 lbs (tested seated arm at side)  ?Middle trapezius         ?Lower trapezius         ?Elbow flexion 5/5       ?Elbow extension 5/5       ?Wrist flexion         ?Wrist extension         ?  Wrist ulnar deviation         ?Wrist radial deviation         ?Wrist pronation         ?Wrist supination         ?Grip strength (lbs)         ?(Blank rows = not tested) ?  ?JOINT MOBILITY TESTING:  ?04/26/2021 : early to mid range Lt Gh joint mobility assessment was normal for capsular mobility. Guarding was noted from musculature.  ?  ?PALPATION:  ?04/26/2021 : mild tenderness surrounding Lt shoulder grossly.  ?             ?TODAY'S TREATMENT:  ?06/07/2021: ?Therex: ? UBE fwd/back 4 mins each way AAROM, Lvl 3.5 with 10 second intervals at :50 to :60 ? Supine green band horizontal abduction 2 x 10 ? Supine 2 lb bar AAROM in 20 deg bed elevation x 10  ? Supine serratus punch no weight 2-3 sec hold x 10 bilateral ? Standing green band ER c  slow movent focus - towel at side 3x 10 ? Standing IR band c towel at side 2 x 10 ? Tband rows, gh ext green band 2 x 10 each ? Standing 1 lb AAROm flexion attempts to 45 deg prior to shrug x 10 ? ?Vasopneumatic  ? Lt shoulder 34 degrees medium compression 10 mins in sitting  ? ?06/04/2021: ?Therex: ? UBE fwd/back 4 mins each way AAROM, Lvl 3.5 ? Supine green band horizontal abduction 2 x 10 ? Supine Lt arm AROM flexion 2 x 10 ?  ? Standing AAROM flexion with hands clasped X 10 (cues to avoid shrug) ? Standing painfree isometric flexion, ER, abd Lt arm 5 sec hold x 10 ? ?Neuro Re-ed ? Stabilizations in 90 deg flexion c mild clinician force in multiple directions, bouts 15-20 seconds ?   ?Manual: ? G2-3 inferior Lt GH joint mobs and posterior joint mobs. Lt shoulder PROM to tolerance all planes ? ?05/31/2021: ?Therex: ? UBE fwd/back 4 mins each way AAROM, Lvl 3.5 ? Supine 2 lb bar AAROM protraction 3 sec hold x 10  - Lt am majority ? Supine AAROM 2 lb bar flexion X10 full range c focus on using Lt arm majority ? Supine AAROM 2# bar ER X 10 ? Standing AAROM flexion with hands clasped X 10 (cues to avoid shrug) ? Standing painfree isometric flexion, ER, abd Lt arm 5 sec hold x 10 ?   ? Manual: ? G2-3 inferior Lt GH joint mobs and posterior joint mobs. Lt shoulder PROM to tolerance all planes ? ?PATIENT EDUCATION: ?05/19/2021 ?Education details: HEP progression.  ?Person educated: Patient ?Education method: Explanation, Demonstration, Verbal cues, and Handouts ?Education comprehension: verbalized understanding and returned demonstration ?  ?  ?HOME EXERCISE PROGRAM: ?06/07/2021 ?Access Code: [email or web address] ?URL: [email or web address] ?Date: 06/07/2021 ?Prepared by: Scot Jun ? ?Exercises ?- Supine Shoulder External Rotation in 45 Degrees Abduction AAROM with Dowel  - 2 x daily - 7 x weekly - 1-2 sets - 10 reps - 5 hold ?- Seated Shoulder Flexion AAROM with Pulley Behind  - 2-3 x daily - 7 x weekly - 1 sets - 3  mins hold ?- Seated Shoulder Scaption AAROM with Pulley at Side  - 2-3 x daily - 7 x weekly - 1 sets - 3 min hold ?- Isometric Shoulder Flexion at Wall  - 2 x daily - 7 x weekly - 1 sets - 10-15 reps - 5 hold ?-

## 2021-06-09 ENCOUNTER — Encounter: Payer: No Typology Code available for payment source | Admitting: Rehabilitative and Restorative Service Providers"

## 2021-06-09 ENCOUNTER — Ambulatory Visit (INDEPENDENT_AMBULATORY_CARE_PROVIDER_SITE_OTHER): Payer: No Typology Code available for payment source | Admitting: Orthopedic Surgery

## 2021-06-09 ENCOUNTER — Encounter: Payer: Self-pay | Admitting: Rehabilitative and Restorative Service Providers"

## 2021-06-09 ENCOUNTER — Encounter: Payer: Self-pay | Admitting: Orthopedic Surgery

## 2021-06-09 ENCOUNTER — Ambulatory Visit: Payer: No Typology Code available for payment source | Admitting: Rehabilitative and Restorative Service Providers"

## 2021-06-09 DIAGNOSIS — R600 Localized edema: Secondary | ICD-10-CM | POA: Diagnosis not present

## 2021-06-09 DIAGNOSIS — M25612 Stiffness of left shoulder, not elsewhere classified: Secondary | ICD-10-CM | POA: Diagnosis not present

## 2021-06-09 DIAGNOSIS — M25512 Pain in left shoulder: Secondary | ICD-10-CM | POA: Diagnosis not present

## 2021-06-09 DIAGNOSIS — G8929 Other chronic pain: Secondary | ICD-10-CM

## 2021-06-09 DIAGNOSIS — R293 Abnormal posture: Secondary | ICD-10-CM | POA: Diagnosis not present

## 2021-06-09 DIAGNOSIS — M6281 Muscle weakness (generalized): Secondary | ICD-10-CM

## 2021-06-09 DIAGNOSIS — Z9889 Other specified postprocedural states: Secondary | ICD-10-CM

## 2021-06-09 NOTE — Progress Notes (Signed)
? ?Post-Op Visit Note ?  ?Patient: Dennis Villarreal           ?Date of Birth: [birth year]           ?MRN: [id number] ?Visit Date: 06/09/2021 ?PCP: [name], MD ? ? ?Assessment & Plan: ? ?Chief Complaint:  ?Chief Complaint  ?Patient presents with  ? Left Shoulder - Routine Post Op  ?  04/13/21 (8w 1d)Left Shoulder Arthroscopy, Debridement, Mini Open Rotator Cuff Tear Repair, Biceps Tenodesis  ?  ? ?Visit Diagnoses:  ?1. S/P rotator cuff repair   ? ? ?Plan: [name] is a 71 year old patient is now 3 months out from left shoulder rotator cuff repair and biceps tenodesis.  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  He is doing well with his shoulder.  Still has a little bit of weakness but he is making good gains with range of motion.  On examination he has range of motion of 50/100/165.  External rotation strength is still about 4-5 on the left but with lifting duties of less then 10 pound lifting limit for 2 months with no overhead lifting.  8-week recheck to recheck strength.  Physical therapy here 1 times a week for 6 weeks for strengthening and range of motion plus home exercise program. ? ?Follow-Up Instructions: Return in about 8 weeks (around 08/04/2021).  ? ?Orders:  ?No orders of the defined types were placed in this encounter. ? ?No orders of the defined types were placed in this encounter. ? ? ?Imaging: ?No results found. ? ?PMFS History: ?Patient Active Problem List  ? Diagnosis Date Noted  ? Complete tear of left rotator cuff   ? Biceps tendonitis on left   ? Degenerative superior labral anterior-to-posterior (SLAP) tear of left shoulder   ? BPH (benign prostatic hyperplasia) 05/24/2017  ? Diplopia 03/18/2014  ? Dizziness and giddiness 03/18/2014  ? Vision loss 03/18/2014  ? Sinusitis 02/03/2013  ? Anxiety and depression 09/13/2012  ? History of melanoma 06/30/2011  ? Hyperlipidemia 09/04/2009  ? ALLERGIC RHINITIS 09/04/2009  ? GERD 09/04/2009  ? ?Past Medical History:  ?Diagnosis  Date  ? ALLERGIC RHINITIS 09/04/2009  ? Basal cell carcinoma   ? Cancer [facility]) 2012  ? melanoma  ? DYSLIPIDEMIA 09/04/2009  ? GERD 09/04/2009  ? High cholesterol   ? just above borderline per patient   ?  ?Family History  ?Problem Relation Age of Onset  ? Heart disease Mother   ?     CVA and cardiac arrhythmia, ? atrial fib  ? Stroke Mother   ? Hypertension Mother   ? Melanoma Father   ? Glaucoma Father   ? Glaucoma Brother   ?  ?Past Surgical History:  ?Procedure Laterality Date  ? GALLBLADDER SURGERY  01/2018  ? SHOULDER ARTHROSCOPY WITH OPEN ROTATOR CUFF REPAIR AND DISTAL CLAVICLE ACROMINECTOMY Left 04/13/2021  ? Procedure: LEFT SHOULDER ARTHROSCOPY, DEBRIDEMENT, MINI OPEN ROTATOR CUFF TEAR REPAIR, BICEPS TENODESIS;  Surgeon: Meredith Pel, MD;  Location: [place];  Service: Orthopedics;  Laterality: Left;  ? [place], 2009  ? left shoulder scromoplasty  ? TONSILLECTOMY    ? TRIGGER FINGER RELEASE Left   ? ?Social History  ? ?Occupational History  ? Occupation: TEFL teacher    ?  Comment: Estate manager/land agent  ?Tobacco Use  ? Smoking status: Former  ?  Packs/day: 1.00  ?  Years: 20.00  ?  Pack years: 20.00  ?  Types: Cigarettes  ?  Quit date: 01/24/1982  ?  Years since quitting: 39.4  ? Smokeless tobacco: Never  ?Vaping Use  ? Vaping Use: Never used  ?Substance and Sexual Activity  ? Alcohol use: Yes  ?  Alcohol/week: 0.0 standard drinks  ?  Comment: socially  ? Drug use: No  ? Sexual activity: Yes  ? ? ? ?

## 2021-06-09 NOTE — Therapy (Signed)
?OUTPATIENT PHYSICAL THERAPY TREATMENT NOTE /PROGRESS NOTE ? ? ?Patient Name: Dennis Villarreal ?MRN: [id number] ?DOB:1950-12-30, 71 y.o., male ?Today's Date: 06/09/2021 ? ?PCP: [name], MD ?REFERRING PROVIDER: Meredith Pel, MD ? ?END OF SESSION:  ? PT End of Session - 06/09/21 0804   ? ? Visit Number 13   ? Number of Visits 20   ? Date for PT Re-Evaluation 07/05/21   ? Authorization Type [employer] $40 copay   ? Progress Note Due on Visit 20   ? PT Start Time 0800   ? PT Stop Time 0850   ? PT Time Calculation (min) 50 min   ? Activity Tolerance Patient tolerated treatment well   ? Behavior During Therapy [facility] for tasks assessed/performed   ? ?  ?  ? ?  ? ? ?Progress Note ?Reporting Period 04/26/2021 to 06/09/2021 ? ?See note below for Objective Data and Assessment of Progress/Goals.  ? ? ? ? ? ?Past Medical History:  ?Diagnosis Date  ? ALLERGIC RHINITIS 09/04/2009  ? Basal cell carcinoma   ? Cancer [facility]) 2012  ? melanoma  ? DYSLIPIDEMIA 09/04/2009  ? GERD 09/04/2009  ? High cholesterol   ? just above borderline per patient   ? ?Past Surgical History:  ?Procedure Laterality Date  ? GALLBLADDER SURGERY  01/2018  ? SHOULDER ARTHROSCOPY WITH OPEN ROTATOR CUFF REPAIR AND DISTAL CLAVICLE ACROMINECTOMY Left 04/13/2021  ? Procedure: LEFT SHOULDER ARTHROSCOPY, DEBRIDEMENT, MINI OPEN ROTATOR CUFF TEAR REPAIR, BICEPS TENODESIS;  Surgeon: Meredith Pel, MD;  Location: [place];  Service: Orthopedics;  Laterality: Left;  ? [place], 2009  ? left shoulder scromoplasty  ? TONSILLECTOMY    ? TRIGGER FINGER RELEASE Left   ? ?Patient Active Problem List  ? Diagnosis Date Noted  ? Complete tear of left rotator cuff   ? Biceps tendonitis on left   ? Degenerative superior labral anterior-to-posterior (SLAP) tear of left shoulder   ? BPH (benign prostatic hyperplasia) 05/24/2017  ? Diplopia 03/18/2014  ? Dizziness and giddiness 03/18/2014  ? Vision loss 03/18/2014  ? Sinusitis 02/03/2013  ? Anxiety and  depression 09/13/2012  ? History of melanoma 06/30/2011  ? Hyperlipidemia 09/04/2009  ? ALLERGIC RHINITIS 09/04/2009  ? GERD 09/04/2009  ? ? ?THERAPY DIAG:  ?Chronic left shoulder pain ? ?Stiffness of left shoulder, not elsewhere classified ? ?Muscle weakness (generalized) ? ?Localized edema ? ?Abnormal posture ? ?  ?REFERRING DIAG: Z98.890 (ICD-10-CM) - History of arthroscopy of left shoulder ?L89.211 (ICD-10-CM) - S/P rotator cuff repair ? ?PERTINENT HISTORY: ?Had surgery 04/13/2021 for rotator cuff repair. History of melanoma  ?  ?ONSET DATE: Surgery 04/13/2021 ?  ?SUBJECTIVE:                                                                                                                                                                                      ?  SUBJECTIVE STATEMENT:  Pt indicated feeling good this morning.  Pt indicated going back to half day work today.  ? ?PAIN:  ? ?NPRS scale: 0/10 at rest ?Pain location: Lt shoulder  ?Pain description: achy ?Aggravating factors: achy after exercise ?Relieving factors: nothing specific  ?  ?PRECAUTIONS: AROM progression.  No strengthening with resistance until 05/25/2021. (Updated from MD office visit on 05/05/2021 ? 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  Various lifting/carrying activity.  ?  ?PLOF: Independent , yard care, Rt hand dominant ?  ?PATIENT GOALS Reduce pain, get back to work and hobbies ?  ?OBJECTIVE:  ?  ?PATIENT SURVEYS:  ?06/04/2021:  FOTO update:   60 ? ?04/26/2021: FOTO intake:  36 predicted: 59  ?  ?COGNITION: ?04/26/2021: Overall cognitive status: Within functional limits for tasks assessed ?                                    ?SENSATION: ? 04/26/2021 WFL ?  ?Edema:  04/26/2021: localized edema Lt shoulder ?  ?POSTURE: ?05/24/2021: no sling use ? ?04/26/2021 Wearing sling  ?  ?UPPER EXTREMITY ROM:  ?  ?ROM Left PROM ?04/26/2021 Left PROM  ?in supine ?05/03/21 Left PROM ?05/10/2021 ? Left  PROM ?05/17/2021 Left PROM ?06/01/21 Left AROM ?06/09/2021  ?Shoulder flexion 95 PROM in supine, pain at end range 100 (muscle guarding and pain noted) 110 deg in supine 130 in supine 150 in supine 154 in supine   ?Shoulder extension         ?Shoulder abduction 90 PROM in supine, pain at end range 100  111 in supine 160 in supine and slight scaption 116 in supine  ?Shoulder adduction         ?Shoulder internal rotation To belly  To belly    70 deg in 45 deg abduction supine  ?Shoulder external rotation 0 PROM in supine, pain at end range 10 PROM in 45 deg abduction ?32 deg 48 deg in 45 deg abduction supine 65 deg in 45 deg abd supine ? 55 deg in 45 deg abduction supine  ?Elbow flexion         ?Elbow extension         ?Wrist flexion         ?Wrist extension         ?Wrist ulnar deviation         ?Wrist radial deviation         ?Wrist pronation         ?Wrist supination         ?(Blank rows = not tested) ?  ?UPPER EXTREMITY MMT: ?  ?MMT Right ?04/26/2021 Left ?04/26/2021 ?Not tested due to surgery protocol Left ?05/10/2021 Left ?05/24/2021 Left ?06/07/2021 Right ?06/09/2021 Left ?06/09/2021  ?Shoulder flexion 5/5   1/5 2-/5  5/5 ?33, 35 lbs (tested in supine 90 deg flexion) 2+/5 c shrug noted against gravity ? ?7, 9 lbs c pain (tested in supine 90 deg flexion)  ?Shoulder extension           ?Shoulder abduction 5/5   1/5 2-/5   2/5  ?Shoulder adduction           ?Shoulder internal rotation 5/5         ?Shoulder external rotation 5/5     13.3, 9.7 lbs (tested seated arm at side) 5/5 ?25, 26 lbs 3+/5 ?9.5,12 lbs  ?  Middle trapezius           ?Lower trapezius           ?Elbow flexion 5/5         ?Elbow extension 5/5         ?Wrist flexion           ?Wrist extension           ?Wrist ulnar deviation           ?Wrist radial deviation           ?Wrist pronation           ?Wrist supination           ?Grip strength (lbs)           ?(Blank rows = not tested) ?  ?JOINT MOBILITY TESTING:  ?04/26/2021 : early to mid range Lt Gh joint mobility  assessment was normal for capsular mobility. Guarding was noted from musculature.  ?  ?PALPATION:  ?04/26/2021 : mild tenderness surrounding Lt shoulder grossly.  ?             ?TODAY'S TREATMENT:  ?06/09/2021: ?Therex: ? Pulleys flexion, abduction 3 mins each way ? Supine 90 deg flexion circles 2 lb 30 x 2 cw, ccw ? Supine serratus punch 2 lb weight 2-3 sec hold x 10 bilateral ? Supine Lt shoulder AROM flexion 1 lb x 7 ( to fatigue), x 20 0 lbs  ? Sidelying Lt shoulder abduction 2 x 10  ? Standing green band ER c slow movent focus - towel at side 3x 10 ? Standing IR band c towel at side 2 x 10 ?  ?Manual: ? Supine posterior GH glide mobilization c movement for ER in 70 deg abduction c PROM ? ?Vasopneumatic  ? Lt shoulder 34 degrees medium compression 10 mins in sitting  ? ?06/07/2021: ?Therex: ? UBE fwd/back 4 mins each way AAROM, Lvl 3.5 with 10 second intervals at :50 to :60 ? Supine green band horizontal abduction 2 x 10 ? Supine 2 lb bar AAROM in 20 deg bed elevation x 10  ? Supine serratus punch no weight 2-3 sec hold x 10 bilateral ? Standing green band ER c slow movent focus - towel at side 3x 10 ? Standing IR band c towel at side 2 x 10 ? Tband rows, gh ext green band 2 x 10 each ? Standing 1 lb AAROm flexion attempts to 45 deg prior to shrug x 10 ? ?Vasopneumatic  ? Lt shoulder 34 degrees medium compression 10 mins in sitting  ? ?06/04/2021: ?Therex: ? UBE fwd/back 4 mins each way AAROM, Lvl 3.5 ? Supine green band horizontal abduction 2 x 10 ? Supine Lt arm AROM flexion 2 x 10 ?  ? Standing AAROM flexion with hands clasped X 10 (cues to avoid shrug) ? Standing painfree isometric flexion, ER, abd Lt arm 5 sec hold x 10 ? ?Neuro Re-ed ? Stabilizations in 90 deg flexion c mild clinician force in multiple directions, bouts 15-20 seconds ?   ?Manual: ? G2-3 inferior Lt GH joint mobs and posterior joint mobs. Lt shoulder PROM to tolerance all planes ? ? ?PATIENT EDUCATION: ?06/09/2021 ?Education details: HEP  progression.  ?Person educated: Patient ?Education method: Explanation, Demonstration, Verbal cues, and Handouts ?Education comprehension: verbalized understanding and returned demonstration ?  ?  ?HOME EXERCISE PROGRAM: ?5/17/

## 2021-06-11 ENCOUNTER — Encounter: Payer: No Typology Code available for payment source | Admitting: Orthopedic Surgery

## 2021-06-14 ENCOUNTER — Encounter: Payer: Self-pay | Admitting: Rehabilitative and Restorative Service Providers"

## 2021-06-14 ENCOUNTER — Ambulatory Visit: Payer: No Typology Code available for payment source | Admitting: Rehabilitative and Restorative Service Providers"

## 2021-06-14 DIAGNOSIS — M25612 Stiffness of left shoulder, not elsewhere classified: Secondary | ICD-10-CM

## 2021-06-14 DIAGNOSIS — M6281 Muscle weakness (generalized): Secondary | ICD-10-CM

## 2021-06-14 DIAGNOSIS — R293 Abnormal posture: Secondary | ICD-10-CM

## 2021-06-14 DIAGNOSIS — G8929 Other chronic pain: Secondary | ICD-10-CM

## 2021-06-14 DIAGNOSIS — M25512 Pain in left shoulder: Secondary | ICD-10-CM | POA: Diagnosis not present

## 2021-06-14 DIAGNOSIS — R600 Localized edema: Secondary | ICD-10-CM | POA: Diagnosis not present

## 2021-06-14 NOTE — Therapy (Signed)
OUTPATIENT PHYSICAL THERAPY TREATMENT NOTE /PROGRESS NOTE   Patient Name: Dennis Villarreal MRN: [id number] DOB:1950/06/30, 71 y.o., male Today's Date: 06/14/2021  PCP: [name], MD REFERRING PROVIDER: Meredith Pel, MD  END OF SESSION:   PT End of Session - 06/14/21 0934     Visit Number 14    Number of Visits 20    Date for PT Re-Evaluation 07/05/21    Authorization Type [employer] $40 copay    Progress Note Due on Visit 20    PT Start Time 0934    PT Stop Time 1024    PT Time Calculation (min) 50 min    Activity Tolerance Patient tolerated treatment well    Behavior During Therapy [facility] for tasks assessed/performed              Progress Note Reporting Period 04/26/2021 to 06/09/2021  See note below for Objective Data and Assessment of Progress/Goals.       Past Medical History:  Diagnosis Date   ALLERGIC RHINITIS 09/04/2009   Basal cell carcinoma    Cancer ([place]) 2012   melanoma   DYSLIPIDEMIA 09/04/2009   GERD 09/04/2009   High cholesterol    just above borderline per patient    Past Surgical History:  Procedure Laterality Date   GALLBLADDER SURGERY  01/2018   SHOULDER ARTHROSCOPY WITH OPEN ROTATOR CUFF REPAIR AND DISTAL CLAVICLE ACROMINECTOMY Left 04/13/2021   Procedure: LEFT SHOULDER ARTHROSCOPY, DEBRIDEMENT, MINI OPEN ROTATOR CUFF TEAR REPAIR, BICEPS TENODESIS;  Surgeon: Meredith Pel, MD;  Location: [place];  Service: Orthopedics;  Laterality: Left;   [place], 2009   left shoulder scromoplasty   TONSILLECTOMY     TRIGGER FINGER RELEASE Left    Patient Active Problem List   Diagnosis Date Noted   Complete tear of left rotator cuff    Biceps tendonitis on left    Degenerative superior labral anterior-to-posterior (SLAP) tear of left shoulder    BPH (benign prostatic hyperplasia) 05/24/2017   Diplopia 03/18/2014   Dizziness and giddiness 03/18/2014   Vision loss 03/18/2014   Sinusitis 02/03/2013   Anxiety and  depression 09/13/2012   History of melanoma 06/30/2011   Hyperlipidemia 09/04/2009   ALLERGIC RHINITIS 09/04/2009   GERD 09/04/2009    THERAPY DIAG:  Chronic left shoulder pain  Stiffness of left shoulder, not elsewhere classified  Muscle weakness (generalized)  Localized edema  Abnormal posture    REFERRING DIAG: Z98.890 (ICD-10-CM) - History of arthroscopy of left shoulder Z98.890 (ICD-10-CM) - S/P rotator cuff repair  PERTINENT HISTORY: Had surgery 04/13/2021 for rotator cuff repair. History of melanoma    ONSET DATE: Surgery 04/13/2021   SUBJECTIVE:  SUBJECTIVE STATEMENT:   Pt indicated going back to work about 20ish hours.  Pt indicated having soreness and increased ache afterwards.  Pt indicated continued off and on trouble sleeping due to arm.   Pt indicated MD visit recommended 1x/week at this time.   PAIN:  NPRS scale: 2-3/10 at rest Pain location: Lt shoulder - anterior/lateral Pain description: achy Aggravating factors: achy after exercise Relieving factors: nothing specific    PRECAUTIONS: AROM progression.  No strengthening with resistance until 05/25/2021. 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  Various lifting/carrying activity.    PLOF: Independent , yard care, Rt hand dominant   PATIENT GOALS Reduce pain, get back to work and hobbies   OBJECTIVE:    PATIENT SURVEYS:  06/04/2021:  FOTO update:   60  04/26/2021: FOTO intake:  36 predicted: 38    COGNITION: 04/26/2021: Overall cognitive status: Within functional limits for tasks assessed                                     SENSATION:  04/26/2021 WFL   Edema:  04/26/2021: localized edema Lt shoulder   POSTURE: 05/24/2021:  no sling use  04/26/2021 Wearing sling    UPPER EXTREMITY ROM:    ROM Left PROM 04/26/2021 Left PROM  in supine 05/03/21 Left PROM 05/10/2021  Left PROM 05/17/2021 Left PROM 06/01/21 Left AROM 06/09/2021  Shoulder flexion 95 PROM in supine, pain at end range 100 (muscle guarding and pain noted) 110 deg in supine 130 in supine 150 in supine 154 in supine   Shoulder extension         Shoulder abduction 90 PROM in supine, pain at end range 100  111 in supine 160 in supine and slight scaption 116 in supine  Shoulder adduction         Shoulder internal rotation To belly  To belly    70 deg in 45 deg abduction supine  Shoulder external rotation 0 PROM in supine, pain at end range 10 PROM in 45 deg abduction 32 deg 48 deg in 45 deg abduction supine 65 deg in 45 deg abd supine  55 deg in 45 deg abduction supine  Elbow flexion         Elbow extension         Wrist flexion         Wrist extension         Wrist ulnar deviation         Wrist radial deviation         Wrist pronation         Wrist supination         (Blank rows = not tested)   UPPER EXTREMITY MMT:   MMT Right 04/26/2021 Left 04/26/2021 Not tested due to surgery protocol Left 05/10/2021 Left 05/24/2021 Left 06/07/2021 Right 06/09/2021 Left 06/09/2021  Shoulder flexion 5/5   1/5 2-/5  5/5 33, 35 lbs (tested in supine 90 deg flexion) 2+/5 c shrug noted against gravity  7, 9 lbs c pain (tested in supine 90 deg flexion)  Shoulder extension           Shoulder abduction 5/5   1/5 2-/5   2/5  Shoulder adduction           Shoulder internal rotation 5/5  Shoulder external rotation 5/5     13.3, 9.7 lbs (tested seated arm at side) 5/5 25, 26 lbs 3+/5 9.5,12 lbs  Middle trapezius           Lower trapezius           Elbow flexion 5/5         Elbow extension 5/5         Wrist flexion           Wrist extension           Wrist ulnar deviation           Wrist radial deviation           Wrist pronation           Wrist supination            Grip strength (lbs)           (Blank rows = not tested)   JOINT MOBILITY TESTING:  04/26/2021 : early to mid range Lt Gh joint mobility assessment was normal for capsular mobility. Guarding was noted from musculature.    PALPATION:  04/26/2021 : mild tenderness surrounding Lt shoulder grossly.               TODAY'S TREATMENT:  06/14/2021: Therex:  Pulleys flexion, abduction 3 mins each way  Supine 90 deg flexion circles 2 lb 30 x 2 cw, ccw  Supine serratus punch 3 lb weight 5 sec hold x 10 bilateral  Supine horizontal abduction green band x 10 (stopped due to difficulty)  Supine Lt shoulder AROM flexion in 30 deg bed elevation 0 lbs to fatigue :  x 20, x 22  Standing green band ER c slow movent focus - towel at side 3x 10     Vasopneumatic   Lt shoulder 34 degrees medium compression 10 mins in sitting   06/09/2021: Therex:  Pulleys flexion, abduction 3 mins each way  Supine 90 deg flexion circles 2 lb 30 x 2 cw, ccw  Supine serratus punch 2 lb weight 2-3 sec hold x 10 bilateral  Supine Lt shoulder AROM flexion 1 lb x 7 ( to fatigue), x 20 0 lbs  Sidelying Lt shoulder abduction 2 x 10   Standing green band ER c slow movent focus - towel at side 3x 10  Standing IR band c towel at side 2 x 10   Manual:  Supine posterior GH glide mobilization c movement for ER in 70 deg abduction c PROM  Vasopneumatic   Lt shoulder 34 degrees medium compression 10 mins in sitting   06/07/2021: Therex:  UBE fwd/back 4 mins each way AAROM, Lvl 3.5 with 10 second intervals at :50 to :60  Supine green band horizontal abduction 2 x 10  Supine 2 lb bar AAROM in 20 deg bed elevation x 10   Supine serratus punch no weight 2-3 sec hold x 10 bilateral  Standing green band ER c slow movent focus - towel at side 3x 10  Standing IR band c towel at side 2 x 10  Tband rows, gh ext green band 2 x 10 each  Standing 1 lb AAROm flexion attempts to 45 deg prior to shrug x 10  Vasopneumatic   Lt shoulder  34 degrees medium compression 10 mins in sitting    PATIENT EDUCATION: 06/09/2021 Education details: HEP progression.  Person educated: Patient Education method: Explanation, Demonstration, Verbal cues, and Handouts Education comprehension: verbalized understanding and returned demonstration  HOME EXERCISE PROGRAM: 06/09/2021 Access Code: [email or web address] URL: [email or web address] Date: 06/09/2021 Prepared by: Scot Jun  Exercises - Supine Shoulder External Rotation in 45 Degrees Abduction AAROM with Dowel  - 2 x daily - 7 x weekly - 1-2 sets - 10 reps - 5 hold - Seated Shoulder Flexion AAROM with Pulley Behind  - 2-3 x daily - 7 x weekly - 1 sets - 3 mins hold - Seated Shoulder Scaption AAROM with Pulley at Side  - 2-3 x daily - 7 x weekly - 1 sets - 3 min hold - Supine Shoulder Flexion Extension Full Range AROM  - 1-2 x daily - 7 x weekly - 1-2 sets - 10-15 reps - Sidelying Shoulder Abduction Palm Forward (Mirrored)  - 1-2 x daily - 7 x weekly - 2-3 sets - 10-15 reps - Standing Shoulder Row with Anchored Resistance  - 1-2 x daily - 7 x weekly - 3 sets - 10 reps - Shoulder External Rotation with Anchored Resistance  - 1-2 x daily - 7 x weekly - 3 sets - 10 reps - Shoulder Extension with Resistance  - 1-2 x daily - 7 x weekly - 3 sets - 10 reps - Supine Shoulder Circles with Weight  - 1-2 x daily - 7 x weekly - 1-2 sets - 30 reps - Supine Scapular Protraction in Flexion with Dumbbells  - 1-2 x daily - 7 x weekly - 1 sets - 10 reps - 5 hold   ASSESSMENT:   CLINICAL IMPRESSION: After discussion c Pt today, Pt indicated hesitation about changing to 1x/week at this time due to weakness and limitations.  To best accommodate Pt desires and goals of therapy, Pt and clinician agreed upon continued 1-2x/week for next few weeks then 1x/week.  Noted weakness in Lt arm at this time prompted continued skilled PT services.    OBJECTIVE IMPAIRMENTS decreased activity tolerance,  decreased coordination, decreased endurance, decreased mobility, decreased ROM, decreased strength, hypomobility, increased edema, impaired perceived functional ability, impaired UE functional use, improper body mechanics, postural dysfunction, and pain.    ACTIVITY LIMITATIONS cleaning, community activity, driving, meal prep, occupation, laundry, and yard work.    REHAB POTENTIAL: Good   CLINICAL DECISION MAKING: Stable/uncomplicated   EVALUATION COMPLEXITY: Low     GOALS: Goals reviewed with patient? Yes Eval on 04/26/2021 Short term PT Goals (target date for Short term goals are 3 weeks 05/17/2021) Patient will demonstrate independent use of home exercise program to maintain progress from in clinic treatments. Goal status: MET   Long term PT goals (target dates for all long term goals are 10 weeks  07/05/2021 )   1. Patient will demonstrate/report pain at worst less than or equal to 2/10 to facilitate minimal limitation in daily activity secondary to pain symptoms. Goal status: ongoing assessed 06/09/2021  2. Patient will demonstrate independent use of home exercise program to facilitate ability to maintain/progress functional gains from skilled physical therapy services. Goal status: ongoing assessed 06/09/2021   3. Patient will demonstrate FOTO outcome > or = 59 % to indicate reduced disability due to condition. Goal status: ongoing assessed 06/09/2021   4.  Patient will demonstrate Lt [facility] joint mobility WFL to facilitate usual self care, dressing, reaching overhead at PLOF s limitation due to symptoms.  Goal status: ongoing assessed 06/09/2021   5.  Patient will demonstrate Lt UE MMT 5/5 throughout to facilitate usual lifting, carrying in functional activity to PLOF s limitation.   Goal status: ongoing assessed 06/09/2021  6.  Patient will demonstrate return to work at [employer] s limitation.   Goal status: ongoing assessed 06/09/2021         PLAN: PT FREQUENCY:  2-3x/week   PT  DURATION: 10 weeks   PLANNED INTERVENTIONS: Therapeutic exercises, Therapeutic activity, Neuro Muscular re-education, Balance training, Gait training, Patient/Family education, Joint mobilization, Stair training, DME instructions, Dry Needling, Electrical stimulation, Cryotherapy, Moist heat, Taping, Ultrasound, Ionotophoresis 48m/ml Dexamethasone, and Manual therapy.  All included unless contraindicated   PLAN FOR NEXT SESSION:   Avoid shrug against gravity.  Improve recruitment of musculature in AROM/isometric type activity to tolerance.   MScot Jun PT, DPT, OCS, ATC 06/14/21  10:39 AM

## 2021-06-15 NOTE — Therapy (Unsigned)
OUTPATIENT PHYSICAL THERAPY TREATMENT NOTE /PROGRESS NOTE   Patient Name: Dennis Villarreal MRN: [id number] DOB:11/13/1950, 71 y.o., male Today's Date: 06/15/2021  PCP: [name], MD REFERRING PROVIDER: Meredith Pel, MD  END OF SESSION:      Progress Note Reporting Period 04/26/2021 to 06/09/2021  See note below for Objective Data and Assessment of Progress/Goals.       Past Medical History:  Diagnosis Date   ALLERGIC RHINITIS 09/04/2009   Basal cell carcinoma    Cancer ([place]) 2012   melanoma   DYSLIPIDEMIA 09/04/2009   GERD 09/04/2009   High cholesterol    just above borderline per patient    Past Surgical History:  Procedure Laterality Date   GALLBLADDER SURGERY  01/2018   SHOULDER ARTHROSCOPY WITH OPEN ROTATOR CUFF REPAIR AND DISTAL CLAVICLE ACROMINECTOMY Left 04/13/2021   Procedure: LEFT SHOULDER ARTHROSCOPY, DEBRIDEMENT, MINI OPEN ROTATOR CUFF TEAR REPAIR, BICEPS TENODESIS;  Surgeon: Meredith Pel, MD;  Location: [place];  Service: Orthopedics;  Laterality: Left;   [place], 2009   left shoulder scromoplasty   TONSILLECTOMY     TRIGGER FINGER RELEASE Left    Patient Active Problem List   Diagnosis Date Noted   Complete tear of left rotator cuff    Biceps tendonitis on left    Degenerative superior labral anterior-to-posterior (SLAP) tear of left shoulder    BPH (benign prostatic hyperplasia) 05/24/2017   Diplopia 03/18/2014   Dizziness and giddiness 03/18/2014   Vision loss 03/18/2014   Sinusitis 02/03/2013   Anxiety and depression 09/13/2012   History of melanoma 06/30/2011   Hyperlipidemia 09/04/2009   ALLERGIC RHINITIS 09/04/2009   GERD 09/04/2009    THERAPY DIAG:  No diagnosis found.    REFERRING DIAG: U76.546 (ICD-10-CM) - History of arthroscopy of left shoulder Z98.890 (ICD-10-CM) - S/P rotator cuff repair  PERTINENT HISTORY: Had surgery 04/13/2021 for rotator cuff repair. History of melanoma    ONSET DATE:  Surgery 04/13/2021   SUBJECTIVE:                                                                                                                                                                                      SUBJECTIVE STATEMENT:   Pt arriving today reporting no pain and feels his HEP has been helping.   PAIN:  NPRS scale: 010 at rest Pain location: Lt shoulder - anterior/lateral Pain description: achy Aggravating factors: achy after exercise Relieving factors: nothing specific    PRECAUTIONS: AROM progression.  No strengthening with resistance until 05/25/2021. 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  Various lifting/carrying activity.    PLOF: Independent , yard care, Rt hand dominant   PATIENT GOALS Reduce pain, get back to work and hobbies   OBJECTIVE:    PATIENT SURVEYS:  06/04/2021:  FOTO update:   60  04/26/2021: FOTO intake:  36 predicted: 43    COGNITION: 04/26/2021: Overall cognitive status: Within functional limits for tasks assessed                                     SENSATION:  04/26/2021 WFL   Edema:  04/26/2021: localized edema Lt shoulder   POSTURE: 05/24/2021: no sling use  04/26/2021 Wearing sling    UPPER EXTREMITY ROM:    ROM Left PROM 04/26/2021 Left PROM  in supine 05/03/21 Left PROM 05/10/2021  Left PROM 05/17/2021 Left PROM 06/01/21 Left AROM 06/09/2021 Left AROM 06/17/22 supine  Shoulder flexion 95 PROM in supine, pain at end range 100 (muscle guarding and pain noted) 110 deg in supine 130 in supine 150 in supine 154 in supine  155 supine  Shoulder extension          Shoulder abduction 90 PROM in supine, pain at end range 100  111 in supine 160 in supine and slight scaption 116 in supine 118 supine  Shoulder adduction          Shoulder internal rotation To belly  To belly    70 deg in 45 deg abduction supine 70 with shoulder abd to 45 deg   Shoulder external rotation 0 PROM in supine, pain at end range 10 PROM in 45 deg abduction 32 deg 48 deg in 45 deg abduction supine 65 deg in 45 deg abd supine  55 deg in 45 deg abduction supine   Elbow flexion          Elbow extension          Wrist flexion          Wrist extension          Wrist ulnar deviation          Wrist radial deviation          Wrist pronation          Wrist supination          (Blank rows = not tested)   UPPER EXTREMITY MMT:   MMT Right 04/26/2021 Left 04/26/2021 Not tested due to surgery protocol Left 05/10/2021 Left 05/24/2021 Left 06/07/2021 Right 06/09/2021 Left 06/09/2021  Shoulder flexion 5/5   1/5 2-/5  5/5 33, 35 lbs (tested in supine 90 deg flexion) 2+/5 c shrug noted against gravity  7, 9 lbs c pain (tested in supine 90 deg flexion)  Shoulder extension           Shoulder abduction 5/5   1/5 2-/5   2/5  Shoulder adduction           Shoulder internal rotation 5/5         Shoulder external rotation 5/5     13.3, 9.7 lbs (tested seated arm at side) 5/5 25, 26 lbs 3+/5 9.5,12 lbs  Middle trapezius           Lower trapezius           Elbow flexion 5/5         Elbow extension 5/5         Wrist flexion  Wrist extension           Wrist ulnar deviation           Wrist radial deviation           Wrist pronation           Wrist supination           Grip strength (lbs)           (Blank rows = not tested)   JOINT MOBILITY TESTING:  04/26/2021 : early to mid range Lt Gh joint mobility assessment was normal for capsular mobility. Guarding was noted from musculature.    PALPATION:  04/26/2021 : mild tenderness surrounding Lt shoulder grossly.               TODAY'S TREATMENT:  06/16/2021: Therex:  Pulleys flexion, abduction 3 mins each way  Supine 90 deg flexion circles 2 lb 30 x 2 cw, ccw  Supine horizontal abduction green band x 10 (stopped due to difficulty)  Sidelying abduction c 1# x 15  Sidelying ER c 2 # x 15  Wall push ups  2 x 10  with serratus anterior activation  Standing green band ER c slow movent focus - towel at side 3 x 10 Manual:   Active trigger point release to left rhomboids, upper trap and subscap  Vasopneumatic   Lt shoulder 34 degrees medium compression 10 mins in sitting     06/14/2021: Therex:  Pulleys flexion, abduction 3 mins each way  Supine 90 deg flexion circles 2 lb 30 x 2 cw, ccw  Supine serratus punch 3 lb weight 5 sec hold x 10 bilateral  Supine horizontal abduction green band x 10 (stopped due to difficulty)  Supine Lt shoulder AROM flexion in 30 deg bed elevation 0 lbs to fatigue :  x 20, x 22  Standing green band ER c slow movent focus - towel at side 3x 10     Vasopneumatic   Lt shoulder 34 degrees medium compression 10 mins in sitting   06/09/2021: Therex:  Pulleys flexion, abduction 3 mins each way  Supine 90 deg flexion circles 2 lb 30 x 2 cw, ccw  Supine serratus punch 2 lb weight 2-3 sec hold x 10 bilateral  Supine Lt shoulder AROM flexion 1 lb x 7 ( to fatigue), x 20 0 lbs  Sidelying Lt shoulder abduction 2 x 10   Standing green band ER c slow movent focus - towel at side 3x 10  Standing IR band c towel at side 2 x 10   Manual:  Supine posterior GH glide mobilization c movement for ER in 70 deg abduction c PROM  Vasopneumatic   Lt shoulder 34 degrees medium compression 10 mins in sitting      PATIENT EDUCATION: 06/09/2021 Education details: HEP progression.  Person educated: Patient Education method: Explanation, Demonstration, Verbal cues, and Handouts Education comprehension: verbalized understanding and returned demonstration     HOME EXERCISE PROGRAM: 06/09/2021 Access Code: [email or web address] URL: [email or web address] Date: 06/09/2021 Prepared by: Scot Jun  Exercises - Supine Shoulder External Rotation in 45 Degrees Abduction AAROM with Dowel  - 2 x daily - 7 x weekly - 1-2 sets - 10 reps - 5 hold - Seated Shoulder Flexion AAROM with  Pulley Behind  - 2-3 x daily - 7 x weekly - 1 sets - 3 mins hold - Seated Shoulder Scaption AAROM with Pulley at Side  - 2-3 x daily - 7 x weekly - 1 sets -  3 min hold - Supine Shoulder Flexion Extension Full Range AROM  - 1-2 x daily - 7 x weekly - 1-2 sets - 10-15 reps - Sidelying Shoulder Abduction Palm Forward (Mirrored)  - 1-2 x daily - 7 x weekly - 2-3 sets - 10-15 reps - Standing Shoulder Row with Anchored Resistance  - 1-2 x daily - 7 x weekly - 3 sets - 10 reps - Shoulder External Rotation with Anchored Resistance  - 1-2 x daily - 7 x weekly - 3 sets - 10 reps - Shoulder Extension with Resistance  - 1-2 x daily - 7 x weekly - 3 sets - 10 reps - Supine Shoulder Circles with Weight  - 1-2 x daily - 7 x weekly - 1-2 sets - 30 reps - Supine Scapular Protraction in Flexion with Dumbbells  - 1-2 x daily - 7 x weekly - 1 sets - 10 reps - 5 hold   ASSESSMENT:   CLINICAL IMPRESSION: Pt arriving reporting no pain after taking an ibuprofen prior to therapy. Pt reporting "popping" sensation under scapular with abduction exercises and ER. Pt still with weakness noted throughout shoulder girdle and needed instructions to prevent compensation. Continue skilled to maximize pt's function.    OBJECTIVE IMPAIRMENTS decreased activity tolerance, decreased coordination, decreased endurance, decreased mobility, decreased ROM, decreased strength, hypomobility, increased edema, impaired perceived functional ability, impaired UE functional use, improper body mechanics, postural dysfunction, and pain.    ACTIVITY LIMITATIONS cleaning, community activity, driving, meal prep, occupation, laundry, and yard work.    REHAB POTENTIAL: Good   CLINICAL DECISION MAKING: Stable/uncomplicated   EVALUATION COMPLEXITY: Low     GOALS: Goals reviewed with patient? Yes Eval on 04/26/2021 Short term PT Goals (target date for Short term goals are 3 weeks 05/17/2021) Patient will demonstrate independent use of home exercise  program to maintain progress from in clinic treatments. Goal status: MET   Long term PT goals (target dates for all long term goals are 10 weeks  07/05/2021 )   1. Patient will demonstrate/report pain at worst less than or equal to 2/10 to facilitate minimal limitation in daily activity secondary to pain symptoms. Goal status: ongoing assessed 06/09/2021  2. Patient will demonstrate independent use of home exercise program to facilitate ability to maintain/progress functional gains from skilled physical therapy services. Goal status: ongoing assessed 06/09/2021   3. Patient will demonstrate FOTO outcome > or = 59 % to indicate reduced disability due to condition. Goal status: ongoing assessed 06/09/2021   4.  Patient will demonstrate Lt [facility] joint mobility WFL to facilitate usual self care, dressing, reaching overhead at PLOF s limitation due to symptoms.  Goal status: ongoing assessed 06/09/2021   5.  Patient will demonstrate Lt UE MMT 5/5 throughout to facilitate usual lifting, carrying in functional activity to PLOF s limitation.   Goal status: ongoing assessed 06/09/2021   6.  Patient will demonstrate return to work at [employer] s limitation.   Goal status: ongoing assessed 06/09/2021         PLAN: PT FREQUENCY:  2-3x/week   PT DURATION: 10 weeks   PLANNED INTERVENTIONS: Therapeutic exercises, Therapeutic activity, Neuro Muscular re-education, Balance training, Gait training, Patient/Family education, Joint mobilization, Stair training, DME instructions, Dry Needling, Electrical stimulation, Cryotherapy, Moist heat, Taping, Ultrasound, Ionotophoresis 87m/ml Dexamethasone, and Manual therapy.  All included unless contraindicated   PLAN FOR NEXT SESSION:   Avoid shrug against gravity.  Improve recruitment of musculature in AROM/isometric type activity to tolerance.  Kearney Hard, PT, MPT 06/15/21 4:09 PM   06/15/21  4:09 PM

## 2021-06-16 ENCOUNTER — Ambulatory Visit: Payer: No Typology Code available for payment source | Admitting: Physical Therapy

## 2021-06-16 ENCOUNTER — Encounter: Payer: Self-pay | Admitting: Physical Therapy

## 2021-06-16 ENCOUNTER — Encounter: Payer: No Typology Code available for payment source | Admitting: Rehabilitative and Restorative Service Providers"

## 2021-06-16 DIAGNOSIS — M6281 Muscle weakness (generalized): Secondary | ICD-10-CM | POA: Diagnosis not present

## 2021-06-16 DIAGNOSIS — R293 Abnormal posture: Secondary | ICD-10-CM

## 2021-06-16 DIAGNOSIS — M25612 Stiffness of left shoulder, not elsewhere classified: Secondary | ICD-10-CM | POA: Diagnosis not present

## 2021-06-16 DIAGNOSIS — G8929 Other chronic pain: Secondary | ICD-10-CM | POA: Diagnosis not present

## 2021-06-16 DIAGNOSIS — M25512 Pain in left shoulder: Secondary | ICD-10-CM

## 2021-06-16 DIAGNOSIS — R600 Localized edema: Secondary | ICD-10-CM | POA: Diagnosis not present

## 2021-06-21 ENCOUNTER — Other Ambulatory Visit: Payer: Self-pay | Admitting: Family Medicine

## 2021-06-22 ENCOUNTER — Ambulatory Visit: Payer: No Typology Code available for payment source | Admitting: Rehabilitative and Restorative Service Providers"

## 2021-06-22 ENCOUNTER — Encounter: Payer: Self-pay | Admitting: Rehabilitative and Restorative Service Providers"

## 2021-06-22 DIAGNOSIS — M25512 Pain in left shoulder: Secondary | ICD-10-CM | POA: Diagnosis not present

## 2021-06-22 DIAGNOSIS — G8929 Other chronic pain: Secondary | ICD-10-CM | POA: Diagnosis not present

## 2021-06-22 DIAGNOSIS — R293 Abnormal posture: Secondary | ICD-10-CM | POA: Diagnosis not present

## 2021-06-22 DIAGNOSIS — M6281 Muscle weakness (generalized): Secondary | ICD-10-CM | POA: Diagnosis not present

## 2021-06-22 DIAGNOSIS — M25612 Stiffness of left shoulder, not elsewhere classified: Secondary | ICD-10-CM

## 2021-06-22 DIAGNOSIS — R600 Localized edema: Secondary | ICD-10-CM | POA: Diagnosis not present

## 2021-06-22 NOTE — Therapy (Signed)
OUTPATIENT PHYSICAL THERAPY TREATMENT NOTE /PROGRESS NOTE   Patient Name: Dennis Villarreal MRN: [id number] DOB:06-19-1950, 71 y.o., male Today's Date: 06/22/2021  PCP: [name], MD REFERRING PROVIDER: Meredith Pel, MD  END OF SESSION:   PT End of Session - 06/22/21 1030     Visit Number 16    Number of Visits 20    Date for PT Re-Evaluation 07/05/21    Authorization Type [employer] $40 copay    Progress Note Due on Visit 20    PT Start Time 1012    PT Stop Time 1102    PT Time Calculation (min) 50 min    Activity Tolerance Patient tolerated treatment well    Behavior During Therapy [facility] for tasks assessed/performed               Progress Note Reporting Period 04/26/2021 to 06/09/2021  See note below for Objective Data and Assessment of Progress/Goals.       Past Medical History:  Diagnosis Date   ALLERGIC RHINITIS 09/04/2009   Basal cell carcinoma    Cancer ([place]) 2012   melanoma   DYSLIPIDEMIA 09/04/2009   GERD 09/04/2009   High cholesterol    just above borderline per patient    Past Surgical History:  Procedure Laterality Date   GALLBLADDER SURGERY  01/2018   SHOULDER ARTHROSCOPY WITH OPEN ROTATOR CUFF REPAIR AND DISTAL CLAVICLE ACROMINECTOMY Left 04/13/2021   Procedure: LEFT SHOULDER ARTHROSCOPY, DEBRIDEMENT, MINI OPEN ROTATOR CUFF TEAR REPAIR, BICEPS TENODESIS;  Surgeon: Meredith Pel, MD;  Location: [place];  Service: Orthopedics;  Laterality: Left;   [place], 2009   left shoulder scromoplasty   TONSILLECTOMY     TRIGGER FINGER RELEASE Left    Patient Active Problem List   Diagnosis Date Noted   Complete tear of left rotator cuff    Biceps tendonitis on left    Degenerative superior labral anterior-to-posterior (SLAP) tear of left shoulder    BPH (benign prostatic hyperplasia) 05/24/2017   Diplopia 03/18/2014   Dizziness and giddiness 03/18/2014   Vision loss 03/18/2014   Sinusitis 02/03/2013   Anxiety and  depression 09/13/2012   History of melanoma 06/30/2011   Hyperlipidemia 09/04/2009   ALLERGIC RHINITIS 09/04/2009   GERD 09/04/2009    THERAPY DIAG:  Chronic left shoulder pain  Stiffness of left shoulder, not elsewhere classified  Muscle weakness (generalized)  Localized edema  Abnormal posture    REFERRING DIAG: Z98.890 (ICD-10-CM) - History of arthroscopy of left shoulder Z98.890 (ICD-10-CM) - S/P rotator cuff repair  PERTINENT HISTORY: Had surgery 04/13/2021 for rotator cuff repair. History of melanoma    ONSET DATE: Surgery 04/13/2021   SUBJECTIVE:  SUBJECTIVE STATEMENT:   Pt indicated he was getting more movement out of arm, specifically forward.  Out to the side is still hard.  PAIN:  NPRS scale: 010 at rest, at worst in last 24 hours 0/10 Pain location: Lt shoulder - anterior/lateral Pain description: achy Aggravating factors: achy after exercise Relieving factors: nothing specific    PRECAUTIONS: AROM progression.  No strengthening with resistance until 05/25/2021. 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    PLOF: Independent , yard care, Rt hand dominant   PATIENT GOALS Reduce pain, get back to work and hobbies   OBJECTIVE:    PATIENT SURVEYS:  06/04/2021:  FOTO update:   60  04/26/2021: FOTO intake:  36 predicted: 71    COGNITION: 04/26/2021: Overall cognitive status: Within functional limits for tasks assessed                                     SENSATION:  04/26/2021 WFL   Edema:  04/26/2021: localized edema Lt shoulder   POSTURE: 05/24/2021: no sling use  04/26/2021 Wearing sling    UPPER EXTREMITY ROM:    ROM Left PROM 04/26/2021  Left PROM  in supine 05/03/21 Left PROM 05/10/2021  Left PROM 05/17/2021 Left PROM 06/01/21 Left AROM 06/09/2021 Left AROM 06/17/22 supine  Shoulder flexion 95 PROM in supine, pain at end range 100 (muscle guarding and pain noted) 110 deg in supine 130 in supine 150 in supine 154 in supine  155 supine  Shoulder extension          Shoulder abduction 90 PROM in supine, pain at end range 100  111 in supine 160 in supine and slight scaption 116 in supine 118 supine  Shoulder adduction          Shoulder internal rotation To belly  To belly    70 deg in 45 deg abduction supine 70 with shoulder abd to 45 deg  Shoulder external rotation 0 PROM in supine, pain at end range 10 PROM in 45 deg abduction 32 deg 48 deg in 45 deg abduction supine 65 deg in 45 deg abd supine  55 deg in 45 deg abduction supine   Elbow flexion          Elbow extension          Wrist flexion          Wrist extension          Wrist ulnar deviation          Wrist radial deviation          Wrist pronation          Wrist supination          (Blank rows = not tested)   UPPER EXTREMITY MMT:   MMT Right 04/26/2021 Left 04/26/2021 Not tested due to surgery protocol Left 05/10/2021 Left 05/24/2021 Left 06/07/2021 Right 06/09/2021 Left 06/09/2021 Left 06/22/2021  Shoulder flexion 5/5   1/5 2-/5  5/5 33, 35 lbs (tested in supine 90 deg flexion) 2+/5 c shrug noted against gravity  7, 9 lbs c pain (tested in supine 90 deg flexion) 3+/5   Shoulder extension            Shoulder abduction 5/5   1/5 2-/5   2/5   Shoulder adduction  Shoulder internal rotation 5/5          Shoulder external rotation 5/5     13.3, 9.7 lbs (tested seated arm at side) 5/5 25, 26 lbs 3+/5 9.5,12 lbs   Middle trapezius            Lower trapezius            Elbow flexion 5/5          Elbow extension 5/5          Wrist flexion            Wrist extension            Wrist ulnar deviation            Wrist radial deviation            Wrist  pronation            Wrist supination            Grip strength (lbs)            (Blank rows = not tested)   JOINT MOBILITY TESTING:  04/26/2021 : early to mid range Lt Gh joint mobility assessment was normal for capsular mobility. Guarding was noted from musculature.    PALPATION:  04/26/2021 : mild tenderness surrounding Lt shoulder grossly.               TODAY'S TREATMENT:  06/22/2021: Therex:  Pulleys flexion, abduction 3 mins each way  Prone scaption hold 5 sec x 10  Prone scaption c GH ext hold 5 sec x 10  Prone T, y x 5 each (limited due to pain/difficulty so held)  Standing wall circles in 90 deg flexion cw, ccw 2 x 20 each way Lt arm  Wall push ups  2 x 10 with serratus anterior activation  Standing wall slides flexion 2-3 sec hold x 10  Standing green band ER c slow movent focus - towel at side 2 x 15  Vasopneumatic   Lt shoulder 34 degrees medium compression 10 mins in sitting   06/16/2021: Therex:  Pulleys flexion, abduction 3 mins each way  Supine 90 deg flexion circles 2 lb 30 x 2 cw, ccw  Supine horizontal abduction green band x 10 (stopped due to difficulty)  Sidelying abduction c 1# x 15  Sidelying ER c 2 # x 15  Wall push ups  2 x 10 with serratus anterior activation  Standing green band ER c slow movent focus - towel at side 3 x 10 Manual:   Active trigger point release to left rhomboids, upper trap and subscap  Vasopneumatic   Lt shoulder 34 degrees medium compression 10 mins in sitting   06/14/2021: Therex:  Pulleys flexion, abduction 3 mins each way  Supine 90 deg flexion circles 2 lb 30 x 2 cw, ccw  Supine serratus punch 3 lb weight 5 sec hold x 10 bilateral  Supine horizontal abduction green band x 10 (stopped due to difficulty)  Supine Lt shoulder AROM flexion in 30 deg bed elevation 0 lbs to fatigue :  x 20, x 22  Standing green band ER c slow movent focus - towel at side 3x 10     Vasopneumatic   Lt shoulder 34 degrees medium compression 10 mins  in sitting   PATIENT EDUCATION: 06/22/2021 Education details: HEP progression.  Person educated: Patient Education method: Explanation, Demonstration, Verbal cues, and Handouts Education comprehension: verbalized understanding and returned demonstration  HOME EXERCISE PROGRAM: Access Code: [email or web address] URL: [email or web address] Date: 06/22/2021 Prepared by: Scot Jun  Exercises - Supine Shoulder External Rotation in 45 Degrees Abduction AAROM with Dowel  - 2 x daily - 7 x weekly - 1-2 sets - 10 reps - 5 hold - Sidelying Shoulder Abduction Palm Forward (Mirrored)  - 1-2 x daily - 7 x weekly - 2-3 sets - 10-15 reps - Standing Shoulder Row with Anchored Resistance  - 1-2 x daily - 7 x weekly - 3 sets - 10 reps - Shoulder Extension with Resistance  - 1-2 x daily - 7 x weekly - 3 sets - 10 reps - Supine Shoulder Circles with Weight  - 1-2 x daily - 7 x weekly - 1-2 sets - 30 reps - Standing Shoulder Flexion to 90 Degrees with Dumbbells  - 1-2 x daily - 7 x weekly - 1-2 sets - 10-15 reps - Prone Scapular Slide with Shoulder Extension  - 2 x daily - 7 x weekly - 1 sets - 10 reps - 5 hold - Shoulder External Rotation with Anchored Resistance  - 1-2 x daily - 7 x weekly - 2-3 sets - 10-15 reps   ASSESSMENT:   CLINICAL IMPRESSION: Improvement in active movement against gravity c shrug mild now and noted at 110 degrees or so of elevation.  Continued scapular strengthening and rotator cuff strength improvements warranted in skilled PT services at this time.    OBJECTIVE IMPAIRMENTS decreased activity tolerance, decreased coordination, decreased endurance, decreased mobility, decreased ROM, decreased strength, hypomobility, increased edema, impaired perceived functional ability, impaired UE functional use, improper body mechanics, postural dysfunction, and pain.    ACTIVITY LIMITATIONS cleaning, community activity, driving, meal prep, occupation, laundry, and yard work.     REHAB POTENTIAL: Good   CLINICAL DECISION MAKING: Stable/uncomplicated   EVALUATION COMPLEXITY: Low     GOALS: Goals reviewed with patient? Yes Eval on 04/26/2021 Short term PT Goals (target date for Short term goals are 3 weeks 05/17/2021) Patient will demonstrate independent use of home exercise program to maintain progress from in clinic treatments. Goal status: MET   Long term PT goals (target dates for all long term goals are 10 weeks  07/05/2021 )   1. Patient will demonstrate/report pain at worst less than or equal to 2/10 to facilitate minimal limitation in daily activity secondary to pain symptoms. Goal status: ongoing assessed 06/09/2021  2. Patient will demonstrate independent use of home exercise program to facilitate ability to maintain/progress functional gains from skilled physical therapy services. Goal status: ongoing assessed 06/09/2021   3. Patient will demonstrate FOTO outcome > or = 59 % to indicate reduced disability due to condition. Goal status: ongoing assessed 06/09/2021   4.  Patient will demonstrate Lt [facility] joint mobility WFL to facilitate usual self care, dressing, reaching overhead at PLOF s limitation due to symptoms.  Goal status: ongoing assessed 06/09/2021   5.  Patient will demonstrate Lt UE MMT 5/5 throughout to facilitate usual lifting, carrying in functional activity to PLOF s limitation.   Goal status: ongoing assessed 06/09/2021   6.  Patient will demonstrate return to work at [employer] s limitation.   Goal status: ongoing assessed 06/09/2021         PLAN: PT FREQUENCY:  2-3x/week   PT DURATION: 10 weeks   PLANNED INTERVENTIONS: Therapeutic exercises, Therapeutic activity, Neuro Muscular re-education, Balance training, Gait training, Patient/Family education, Joint mobilization, Stair training, DME instructions, Dry Needling, Electrical stimulation, Cryotherapy, Moist heat, Taping, Ultrasound,  Ionotophoresis 39m/ml Dexamethasone, and Manual therapy.   All included unless contraindicated   PLAN FOR NEXT SESSION:   Prone middle and lower trap strengthening as appropriate.  Elevation strengthening.   MScot Jun PT, DPT, OCS, ATC 06/22/21  10:48 AM

## 2021-06-23 ENCOUNTER — Encounter: Payer: No Typology Code available for payment source | Admitting: Rehabilitative and Restorative Service Providers"

## 2021-06-23 ENCOUNTER — Ambulatory Visit (INDEPENDENT_AMBULATORY_CARE_PROVIDER_SITE_OTHER): Payer: No Typology Code available for payment source | Admitting: Family Medicine

## 2021-06-23 ENCOUNTER — Encounter: Payer: Self-pay | Admitting: Family Medicine

## 2021-06-23 VITALS — BP 108/80 | HR 59 | Temp 98.8°F | Wt 150.4 lb

## 2021-06-23 DIAGNOSIS — S1096XA Insect bite of unspecified part of neck, initial encounter: Secondary | ICD-10-CM | POA: Diagnosis not present

## 2021-06-23 DIAGNOSIS — W57XXXA Bitten or stung by nonvenomous insect and other nonvenomous arthropods, initial encounter: Secondary | ICD-10-CM | POA: Diagnosis not present

## 2021-06-23 MED ORDER — DOXYCYCLINE HYCLATE 100 MG PO CAPS
100.0000 mg | ORAL_CAPSULE | Freq: Two times a day (BID) | ORAL | 0 refills | Status: AC
Start: 2021-06-23 — End: 2021-07-03

## 2021-06-23 NOTE — Progress Notes (Signed)
   Subjective:    Patient ID: Dennis Villarreal, male    DOB: 26-Oct-1950, 71 y.o.   MRN: [id number]  HPI Here for a tick bite that he noticed 6 days ago. He saw the embedded tick and he pulled it out with tweezers. The area around it on the left upper chest and neck has developed a red ring. He has also had some headaches. No fever.    Review of Systems  Constitutional: Negative.   Respiratory: Negative.    Cardiovascular: Negative.   Skin:  Positive for color change.  Neurological:  Positive for headaches.      Objective:   Physical Exam Constitutional:      Appearance: Normal appearance. He is not ill-appearing.  Cardiovascular:     Rate and Rhythm: Normal rate and regular rhythm.     Pulses: Normal pulses.     Heart sounds: Normal heart sounds.  Pulmonary:     Effort: Pulmonary effort is normal.     Breath sounds: Normal breath sounds.  Skin:    Comments: The upper left chest at the inferior neck has a tiny bite mark which is surrounded by a 7 cm area of erythema. No tenderness   Neurological:     Mental Status: He is alert.          Assessment & Plan:  Tick bite, cover with 10 days of Doxycycline.  Alysia Penna, MD

## 2021-06-28 ENCOUNTER — Encounter: Payer: Self-pay | Admitting: Rehabilitative and Restorative Service Providers"

## 2021-06-28 ENCOUNTER — Ambulatory Visit: Payer: No Typology Code available for payment source | Admitting: Rehabilitative and Restorative Service Providers"

## 2021-06-28 DIAGNOSIS — M25512 Pain in left shoulder: Secondary | ICD-10-CM | POA: Diagnosis not present

## 2021-06-28 DIAGNOSIS — G8929 Other chronic pain: Secondary | ICD-10-CM | POA: Diagnosis not present

## 2021-06-28 DIAGNOSIS — M25612 Stiffness of left shoulder, not elsewhere classified: Secondary | ICD-10-CM | POA: Diagnosis not present

## 2021-06-28 DIAGNOSIS — R600 Localized edema: Secondary | ICD-10-CM | POA: Diagnosis not present

## 2021-06-28 DIAGNOSIS — M6281 Muscle weakness (generalized): Secondary | ICD-10-CM

## 2021-06-28 DIAGNOSIS — R293 Abnormal posture: Secondary | ICD-10-CM

## 2021-06-28 NOTE — Therapy (Signed)
OUTPATIENT PHYSICAL THERAPY TREATMENT NOTE /PROGRESS NOTE   Patient Name: Dennis Villarreal MRN: [id number] DOB:August 29, 1950, 71 y.o., male Today's Date: 06/28/2021  PCP: [name], MD REFERRING PROVIDER: Meredith Pel, MD  END OF SESSION:   PT End of Session - 06/28/21 1109     Visit Number 17    Number of Visits 20    Date for PT Re-Evaluation 07/05/21    Authorization Type [employer] $40 copay    Progress Note Due on Visit 20    PT Start Time 1100    PT Stop Time 1150    PT Time Calculation (min) 50 min    Activity Tolerance Patient tolerated treatment well    Behavior During Therapy [facility] for tasks assessed/performed                Progress Note Reporting Period 04/26/2021 to 06/09/2021  See note below for Objective Data and Assessment of Progress/Goals.       Past Medical History:  Diagnosis Date   ALLERGIC RHINITIS 09/04/2009   Basal cell carcinoma    Cancer ([place]) 2012   melanoma   DYSLIPIDEMIA 09/04/2009   GERD 09/04/2009   High cholesterol    just above borderline per patient    Past Surgical History:  Procedure Laterality Date   GALLBLADDER SURGERY  01/2018   SHOULDER ARTHROSCOPY WITH OPEN ROTATOR CUFF REPAIR AND DISTAL CLAVICLE ACROMINECTOMY Left 04/13/2021   Procedure: LEFT SHOULDER ARTHROSCOPY, DEBRIDEMENT, MINI OPEN ROTATOR CUFF TEAR REPAIR, BICEPS TENODESIS;  Surgeon: Meredith Pel, MD;  Location: [place];  Service: Orthopedics;  Laterality: Left;   [place], 2009   left shoulder scromoplasty   TONSILLECTOMY     TRIGGER FINGER RELEASE Left    Patient Active Problem List   Diagnosis Date Noted   Complete tear of left rotator cuff    Biceps tendonitis on left    Degenerative superior labral anterior-to-posterior (SLAP) tear of left shoulder    BPH (benign prostatic hyperplasia) 05/24/2017   Diplopia 03/18/2014   Dizziness and giddiness 03/18/2014   Vision loss 03/18/2014   Sinusitis 02/03/2013   Anxiety  and depression 09/13/2012   History of melanoma 06/30/2011   Hyperlipidemia 09/04/2009   ALLERGIC RHINITIS 09/04/2009   GERD 09/04/2009    THERAPY DIAG:  Chronic left shoulder pain  Stiffness of left shoulder, not elsewhere classified  Muscle weakness (generalized)  Localized edema  Abnormal posture    REFERRING DIAG: Z98.890 (ICD-10-CM) - History of arthroscopy of left shoulder Z98.890 (ICD-10-CM) - S/P rotator cuff repair  PERTINENT HISTORY: Had surgery 04/13/2021 for rotator cuff repair. History of melanoma    ONSET DATE: Surgery 04/13/2021   SUBJECTIVE:  SUBJECTIVE STATEMENT:   Pt indicated some soreness after activity during the day, some popping in back of shoulder as well.  Pt indicated nothing severe and he does feel like he is getting better.   PAIN:  NPRS scale: at worst 3-4/10 Pain location: Lt shoulder - anterior/lateral Pain description: achy Aggravating factors: achy after exercise Relieving factors: nothing specific    PRECAUTIONS: AROM progression.  No strengthening with resistance until 05/25/2021. 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  Various lifting/carrying activity.    PLOF: Independent , yard care, Rt hand dominant   PATIENT GOALS Reduce pain, get back to work and hobbies   OBJECTIVE:    PATIENT SURVEYS:  06/04/2021:  FOTO update:   60  04/26/2021: FOTO intake:  36 predicted: 3    COGNITION: 04/26/2021: Overall cognitive status: Within functional limits for tasks assessed                                     SENSATION:  04/26/2021 WFL   Edema:  04/26/2021: localized edema Lt shoulder   POSTURE: 05/24/2021: no sling use  04/26/2021 Wearing sling    UPPER  EXTREMITY ROM:    ROM Left PROM 04/26/2021 Left PROM  in supine 05/03/21 Left PROM 05/10/2021  Left PROM 05/17/2021 Left PROM 06/01/21 Left AROM 06/09/2021 Left AROM 06/17/22 supine  Shoulder flexion 95 PROM in supine, pain at end range 100 (muscle guarding and pain noted) 110 deg in supine 130 in supine 150 in supine 154 in supine  155 supine  Shoulder extension          Shoulder abduction 90 PROM in supine, pain at end range 100  111 in supine 160 in supine and slight scaption 116 in supine 118 supine  Shoulder adduction          Shoulder internal rotation To belly  To belly    70 deg in 45 deg abduction supine 70 with shoulder abd to 45 deg  Shoulder external rotation 0 PROM in supine, pain at end range 10 PROM in 45 deg abduction 32 deg 48 deg in 45 deg abduction supine 65 deg in 45 deg abd supine  55 deg in 45 deg abduction supine   Elbow flexion          Elbow extension          Wrist flexion          Wrist extension          Wrist ulnar deviation          Wrist radial deviation          Wrist pronation          Wrist supination          (Blank rows = not tested)   UPPER EXTREMITY MMT:   MMT Right 04/26/2021 Left 04/26/2021 Not tested due to surgery protocol Left 05/10/2021 Left 05/24/2021 Left 06/07/2021 Right 06/09/2021 Left 06/09/2021 Left 06/22/2021  Shoulder flexion 5/5   1/5 2-/5  5/5 33, 35 lbs (tested in supine 90 deg flexion) 2+/5 c shrug noted against gravity  7, 9 lbs c pain (tested in supine 90 deg flexion) 3+/5   Shoulder extension            Shoulder abduction 5/5   1/5 2-/5   2/5  Shoulder adduction            Shoulder internal rotation 5/5          Shoulder external rotation 5/5     13.3, 9.7 lbs (tested seated arm at side) 5/5 25, 26 lbs 3+/5 9.5,12 lbs   Middle trapezius            Lower trapezius            Elbow flexion 5/5          Elbow extension 5/5          Wrist flexion            Wrist extension            Wrist ulnar deviation             Wrist radial deviation            Wrist pronation            Wrist supination            Grip strength (lbs)            (Blank rows = not tested)   JOINT MOBILITY TESTING:  04/26/2021 : early to mid range Lt Gh joint mobility assessment was normal for capsular mobility. Guarding was noted from musculature.    PALPATION:  04/26/2021 : mild tenderness surrounding Lt shoulder grossly.               TODAY'S TREATMENT:  06/28/2021: Therex: UBE fwd/back 4 mins each way Lvl 3.0 c 10 second faster interval end of each minute Prone t Lt 2 x 10 (after 5 of tactile cues passive movement) Pron y Lt 2 x 10 (after 5 of tactile cues passive movement)  Standing shoulder flexion, scaption 0-90 degrees (prior to shrug) 2 x 10 c slow movement control focus  Standing wall slides flexion 2-3 sec hold x 10 c lift off  Standing green band ER c slow movent focus - towel at side 2 x 15  Vasopneumatic   Lt shoulder 34 degrees medium compression 10 mins in sitting   06/22/2021: Therex:  Pulleys flexion, abduction 3 mins each way  Prone scaption hold 5 sec x 10  Prone scaption c GH ext hold 5 sec x 10  Prone T, y x 5 each (limited due to pain/difficulty so held)  Standing wall circles in 90 deg flexion cw, ccw 2 x 20 each way Lt arm  Wall push ups  2 x 10 with serratus anterior activation  Standing wall slides flexion 2-3 sec hold x 10  Standing green band ER c slow movent focus - towel at side 2 x 15  Vasopneumatic   Lt shoulder 34 degrees medium compression 10 mins in sitting   06/16/2021: Therex:  Pulleys flexion, abduction 3 mins each way  Supine 90 deg flexion circles 2 lb 30 x 2 cw, ccw  Supine horizontal abduction green band x 10 (stopped due to difficulty)  Sidelying abduction c 1# x 15  Sidelying ER c 2 # x 15  Wall push ups  2 x 10 with serratus anterior activation  Standing green band ER c slow movent focus - towel at side 3 x 10 Manual:   Active trigger point release to left rhomboids,  upper trap and subscap  Vasopneumatic   Lt shoulder 34 degrees medium compression 10 mins in sitting   PATIENT EDUCATION: 06/22/2021 Education details: HEP progression.  Person educated: Patient Education  method: Explanation, Demonstration, Verbal cues, and Handouts Education comprehension: verbalized understanding and returned demonstration     HOME EXERCISE PROGRAM: Access Code: [email or web address] URL: [email or web address] Date: 06/22/2021 Prepared by: Scot Jun  Exercises - Supine Shoulder External Rotation in 45 Degrees Abduction AAROM with Dowel  - 2 x daily - 7 x weekly - 1-2 sets - 10 reps - 5 hold - Sidelying Shoulder Abduction Palm Forward (Mirrored)  - 1-2 x daily - 7 x weekly - 2-3 sets - 10-15 reps - Standing Shoulder Row with Anchored Resistance  - 1-2 x daily - 7 x weekly - 3 sets - 10 reps - Shoulder Extension with Resistance  - 1-2 x daily - 7 x weekly - 3 sets - 10 reps - Supine Shoulder Circles with Weight  - 1-2 x daily - 7 x weekly - 1-2 sets - 30 reps - Standing Shoulder Flexion to 90 Degrees with Dumbbells  - 1-2 x daily - 7 x weekly - 1-2 sets - 10-15 reps - Prone Scapular Slide with Shoulder Extension  - 2 x daily - 7 x weekly - 1 sets - 10 reps - 5 hold - Shoulder External Rotation with Anchored Resistance  - 1-2 x daily - 7 x weekly - 2-3 sets - 10-15 reps   ASSESSMENT:   CLINICAL IMPRESSION: Pt appropriate for 1x/week at this time c goals of transitioning towards HEP in future once strength gains noted.  Scapular strengthening and movement pattern improvements to continue to help improve functional use of Lt arm.    OBJECTIVE IMPAIRMENTS decreased activity tolerance, decreased coordination, decreased endurance, decreased mobility, decreased ROM, decreased strength, hypomobility, increased edema, impaired perceived functional ability, impaired UE functional use, improper body mechanics, postural dysfunction, and pain.    ACTIVITY LIMITATIONS  cleaning, community activity, driving, meal prep, occupation, laundry, and yard work.    REHAB POTENTIAL: Good   CLINICAL DECISION MAKING: Stable/uncomplicated   EVALUATION COMPLEXITY: Low     GOALS: Goals reviewed with patient? Yes Eval on 04/26/2021 Short term PT Goals (target date for Short term goals are 3 weeks 05/17/2021) Patient will demonstrate independent use of home exercise program to maintain progress from in clinic treatments. Goal status: MET   Long term PT goals (target dates for all long term goals are 10 weeks  07/05/2021 )   1. Patient will demonstrate/report pain at worst less than or equal to 2/10 to facilitate minimal limitation in daily activity secondary to pain symptoms. Goal status: ongoing assessed 06/09/2021  2. Patient will demonstrate independent use of home exercise program to facilitate ability to maintain/progress functional gains from skilled physical therapy services. Goal status: ongoing assessed 06/09/2021   3. Patient will demonstrate FOTO outcome > or = 59 % to indicate reduced disability due to condition. Goal status: ongoing assessed 06/09/2021   4.  Patient will demonstrate Lt [facility] joint mobility WFL to facilitate usual self care, dressing, reaching overhead at PLOF s limitation due to symptoms.  Goal status: ongoing assessed 06/09/2021   5.  Patient will demonstrate Lt UE MMT 5/5 throughout to facilitate usual lifting, carrying in functional activity to PLOF s limitation.   Goal status: ongoing assessed 06/09/2021   6.  Patient will demonstrate return to work at [employer] s limitation.   Goal status: ongoing assessed 06/09/2021         PLAN: PT FREQUENCY:  2-3x/week   PT DURATION: 10 weeks   PLANNED INTERVENTIONS: Therapeutic exercises, Therapeutic activity, Neuro Muscular re-education, Balance training, Gait  training, Patient/Family education, Joint mobilization, Stair training, DME instructions, Dry Needling, Electrical stimulation, Cryotherapy,  Moist heat, Taping, Ultrasound, Ionotophoresis 27m/ml Dexamethasone, and Manual therapy.  All included unless contraindicated   PLAN FOR NEXT SESSION:   Scapular strengthening, endurance, range against gravity.   MScot Jun PT, DPT, OCS, ATC 06/28/21  11:36 AM

## 2021-06-30 ENCOUNTER — Encounter: Payer: Self-pay | Admitting: Family Medicine

## 2021-06-30 ENCOUNTER — Ambulatory Visit (INDEPENDENT_AMBULATORY_CARE_PROVIDER_SITE_OTHER): Payer: No Typology Code available for payment source | Admitting: Family Medicine

## 2021-06-30 VITALS — BP 140/78 | HR 65 | Temp 97.3°F | Ht 66.0 in | Wt 149.2 lb

## 2021-06-30 DIAGNOSIS — Z630 Problems in relationship with spouse or partner: Secondary | ICD-10-CM

## 2021-06-30 DIAGNOSIS — R4589 Other symptoms and signs involving emotional state: Secondary | ICD-10-CM | POA: Diagnosis not present

## 2021-06-30 NOTE — Progress Notes (Signed)
Established Patient Office Visit  Subjective   Patient ID: Dennis Villarreal, male    DOB: 1950/11/24  Age: 71 y.o. MRN: [id number]  Chief Complaint  Patient presents with   Personal Problem   Referral    Patient request referral to counselor     HPI   Seen with some marital discord and depressed mood.  He states he has been married for 40 years.  Lately, he has noticed that his wife seems more distant.  She usually gets up very early in the morning and goes to exercise and then does not come home until very late.  She used to come home more during the day.  She does work part-time.  She seems more withdrawn and has been communicating less with him.  She since his stay very isolated even at home.  His wife did have brain surgery for excision of benign tumor over a year ago.  Apparently his wife is set up for neurocognitive assessment to make sure there is no relation in any way to her prior brain tumor.  His wife has not agreed to marital counseling.  Patient has had some depressive symptoms with early morning awakening and some anhedonia and depressed mood.  No suicidal ideation.  Past Medical History:  Diagnosis Date   ALLERGIC RHINITIS 09/04/2009   Basal cell carcinoma    Cancer ([place]) 2012   melanoma   DYSLIPIDEMIA 09/04/2009   GERD 09/04/2009   High cholesterol    just above borderline per patient    Past Surgical History:  Procedure Laterality Date   GALLBLADDER SURGERY  01/2018   SHOULDER ARTHROSCOPY WITH OPEN ROTATOR CUFF REPAIR AND DISTAL CLAVICLE ACROMINECTOMY Left 04/13/2021   Procedure: LEFT SHOULDER ARTHROSCOPY, DEBRIDEMENT, MINI OPEN ROTATOR CUFF TEAR REPAIR, BICEPS TENODESIS;  Surgeon: Meredith Pel, MD;  Location: [place];  Service: Orthopedics;  Laterality: Left;   SHOULDER SURGERY  1986, 2009   left shoulder scromoplasty   TONSILLECTOMY     TRIGGER FINGER RELEASE Left     reports that he quit smoking about 39 years ago. His smoking use included  cigarettes. He has a 20.00 pack-year smoking history. He has never used smokeless tobacco. He reports current alcohol use. He reports that he does not use drugs. family history includes Glaucoma in his brother and father; Heart disease in his mother; Hypertension in his mother; Melanoma in his father; Stroke in his mother. Allergies  Allergen Reactions   Levofloxacin     hives and GI upset   Naproxen Hives, Swelling and Other (See Comments)    Can take ibuprofen   Oxycodone-Acetaminophen     Upset stomach    Review of Systems  Constitutional:  Negative for weight loss.  Respiratory:  Negative for shortness of breath.   Cardiovascular:  Negative for chest pain.  Psychiatric/Behavioral:  Positive for depression. Negative for memory loss and suicidal ideas. The patient has insomnia.      Objective:     BP 140/78 (BP Location: Left Arm, Patient Position: Sitting, Cuff Size: Normal)   Pulse 65   Temp (!) 97.3 F (36.3 C) (Oral)   Ht '5\' 6"'$  (1.676 m)   Wt 149 lb 3.2 oz (67.7 kg)   SpO2 99%   BMI 24.08 kg/m  BP Readings from Last 3 Encounters:  06/30/21 140/78  06/23/21 108/80  04/13/21 137/81   Wt Readings from Last 3 Encounters:  06/30/21 149 lb 3.2 oz (67.7 kg)  06/23/21 150 lb 6 oz (  68.2 kg)  04/13/21 140 lb (63.5 kg)      Physical Exam Vitals reviewed.  Constitutional:      Appearance: Normal appearance.  Cardiovascular:     Rate and Rhythm: Normal rate and regular rhythm.  Neurological:     Mental Status: He is alert.  Psychiatric:     Comments: PHQ-9 equals 16     No results found for any visits on 06/30/21.    The 10-year ASCVD risk score (Arnett DK, et al., 2019) is: 18.1%    Assessment & Plan:   Problem List Items Addressed This Visit   None Visit Diagnoses     Marital conflict    -  Primary     Patient presents with marital discord as above.  He specifically would like to consider some counseling.  He does have some depression symptoms and  would like to seek counseling as a first step.  We did discuss potential role of medications such as SSRIs but would like to start with counseling.  He was given information to set up with someone with our behavioral health division.  Ultimately, would benefit from couples counseling but he is not sure his wife is willing. -We have recommended he be in touch for any progressive depression symptoms. -20 minutes were spent in counseling and assessing issues above.  No follow-ups on file.    Carolann Littler, MD

## 2021-07-05 ENCOUNTER — Ambulatory Visit: Payer: No Typology Code available for payment source | Admitting: Rehabilitative and Restorative Service Providers"

## 2021-07-05 ENCOUNTER — Encounter: Payer: Self-pay | Admitting: Rehabilitative and Restorative Service Providers"

## 2021-07-05 DIAGNOSIS — M25512 Pain in left shoulder: Secondary | ICD-10-CM

## 2021-07-05 DIAGNOSIS — G8929 Other chronic pain: Secondary | ICD-10-CM | POA: Diagnosis not present

## 2021-07-05 DIAGNOSIS — M25612 Stiffness of left shoulder, not elsewhere classified: Secondary | ICD-10-CM

## 2021-07-05 DIAGNOSIS — M6281 Muscle weakness (generalized): Secondary | ICD-10-CM | POA: Diagnosis not present

## 2021-07-05 DIAGNOSIS — R600 Localized edema: Secondary | ICD-10-CM

## 2021-07-05 DIAGNOSIS — R293 Abnormal posture: Secondary | ICD-10-CM | POA: Diagnosis not present

## 2021-07-05 NOTE — Therapy (Signed)
OUTPATIENT PHYSICAL THERAPY TREATMENT NOTE   Patient Name: [name] MRN: [id number] DOB:May 02, 1950, 71 y.o., male Today's Date: 07/05/2021  PCP: [name], MD REFERRING PROVIDER: Meredith Pel, MD  END OF SESSION:   PT End of Session - 07/05/21 1020     Visit Number 18    Number of Visits 20    Date for PT Re-Evaluation 07/05/21    Authorization Type [employer] $40 copay    Progress Note Due on Visit 20    PT Start Time 1013    PT Stop Time 1103    PT Time Calculation (min) 50 min    Activity Tolerance Patient tolerated treatment well    Behavior During Therapy [facility] for tasks assessed/performed                 Progress Note Reporting Period 04/26/2021 to 06/09/2021  See note below for Objective Data and Assessment of Progress/Goals.       Past Medical History:  Diagnosis Date   ALLERGIC RHINITIS 09/04/2009   Basal cell carcinoma    Cancer ([place]) 2012   melanoma   DYSLIPIDEMIA 09/04/2009   GERD 09/04/2009   High cholesterol    just above borderline per patient    Past Surgical History:  Procedure Laterality Date   GALLBLADDER SURGERY  01/2018   SHOULDER ARTHROSCOPY WITH OPEN ROTATOR CUFF REPAIR AND DISTAL CLAVICLE ACROMINECTOMY Left 04/13/2021   Procedure: LEFT SHOULDER ARTHROSCOPY, DEBRIDEMENT, MINI OPEN ROTATOR CUFF TEAR REPAIR, BICEPS TENODESIS;  Surgeon: Meredith Pel, MD;  Location: [place];  Service: Orthopedics;  Laterality: Left;   [place], 2009   left shoulder scromoplasty   TONSILLECTOMY     TRIGGER FINGER RELEASE Left    Patient Active Problem List   Diagnosis Date Noted   Complete tear of left rotator cuff    Biceps tendonitis on left    Degenerative superior labral anterior-to-posterior (SLAP) tear of left shoulder    BPH (benign prostatic hyperplasia) 05/24/2017   Diplopia 03/18/2014   Dizziness and giddiness 03/18/2014   Vision loss 03/18/2014   Sinusitis 02/03/2013   Anxiety and depression  09/13/2012   History of melanoma 06/30/2011   Hyperlipidemia 09/04/2009   ALLERGIC RHINITIS 09/04/2009   GERD 09/04/2009    THERAPY DIAG:  Chronic left shoulder pain  Stiffness of left shoulder, not elsewhere classified  Muscle weakness (generalized)  Localized edema  Abnormal posture    REFERRING DIAG: Z98.890 (ICD-10-CM) - History of arthroscopy of left shoulder Z98.890 (ICD-10-CM) - S/P rotator cuff repair  PERTINENT HISTORY: Had surgery 04/13/2021 for rotator cuff repair. History of melanoma    ONSET DATE: Surgery 04/13/2021   SUBJECTIVE:  SUBJECTIVE STATEMENT:   Pt indicated he has been doing some increased work while at work.  Able to do but can tell his arm gets tired.  Similar low level pain indicated.   PAIN:  NPRS scale: at worst 3-4/10 Pain location: Lt shoulder - anterior/lateral Pain description: achy Aggravating factors: achy after exercise Relieving factors: nothing specific    PRECAUTIONS: AROM progression.  No strengthening with resistance until 05/25/2021. 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  Various lifting/carrying activity.    PLOF: Independent , yard care, Rt hand dominant   PATIENT GOALS Reduce pain, get back to work and hobbies   OBJECTIVE:    PATIENT SURVEYS:  06/04/2021:  FOTO update:   60  04/26/2021: FOTO intake:  36 predicted: 35    COGNITION: 04/26/2021: Overall cognitive status: Within functional limits for tasks assessed                                     SENSATION:  04/26/2021 WFL   Edema:  04/26/2021: localized edema Lt shoulder   POSTURE: 05/24/2021: no sling use  04/26/2021 Wearing sling    UPPER EXTREMITY ROM:    ROM Left PROM 04/26/2021  Left PROM  in supine 05/03/21 Left PROM 05/10/2021  Left PROM 05/17/2021 Left PROM 06/01/21 Left AROM 06/09/2021 Left AROM 06/17/22 supine  Shoulder flexion 95 PROM in supine, pain at end range 100 (muscle guarding and pain noted) 110 deg in supine 130 in supine 150 in supine 154 in supine  155 supine  Shoulder extension          Shoulder abduction 90 PROM in supine, pain at end range 100  111 in supine 160 in supine and slight scaption 116 in supine 118 supine  Shoulder adduction          Shoulder internal rotation To belly  To belly    70 deg in 45 deg abduction supine 70 with shoulder abd to 45 deg  Shoulder external rotation 0 PROM in supine, pain at end range 10 PROM in 45 deg abduction 32 deg 48 deg in 45 deg abduction supine 65 deg in 45 deg abd supine  55 deg in 45 deg abduction supine   Elbow flexion          Elbow extension          Wrist flexion          Wrist extension          Wrist ulnar deviation          Wrist radial deviation          Wrist pronation          Wrist supination          (Blank rows = not tested)   UPPER EXTREMITY MMT:   MMT Right 04/26/2021 Left 04/26/2021 Not tested due to surgery protocol Left 05/10/2021 Left 05/24/2021 Left 06/07/2021 Right 06/09/2021 Left 06/09/2021 Left 06/22/2021  Shoulder flexion 5/5   1/5 2-/5  5/5 33, 35 lbs (tested in supine 90 deg flexion) 2+/5 c shrug noted against gravity  7, 9 lbs c pain (tested in supine 90 deg flexion) 3+/5   Shoulder extension            Shoulder abduction 5/5   1/5 2-/5   2/5   Shoulder adduction  Shoulder internal rotation 5/5          Shoulder external rotation 5/5     13.3, 9.7 lbs (tested seated arm at side) 5/5 25, 26 lbs 3+/5 9.5,12 lbs   Middle trapezius            Lower trapezius            Elbow flexion 5/5          Elbow extension 5/5          Wrist flexion            Wrist extension            Wrist ulnar deviation            Wrist radial deviation            Wrist  pronation            Wrist supination            Grip strength (lbs)            (Blank rows = not tested)   JOINT MOBILITY TESTING:  04/26/2021 : early to mid range Lt Gh joint mobility assessment was normal for capsular mobility. Guarding was noted from musculature.    PALPATION:  04/26/2021 : mild tenderness surrounding Lt shoulder grossly.               TODAY'S TREATMENT:  07/05/2021: Therex: UBE fwd/back 3 mins each way Lvl 3.0 Batca rows 10 lbs 2 x 15 Batca lat pull down x 15 15 lbs, x 15 20 lbs Prone t Lt  x 10 (after 5 of tactile cues passive movement) (held more reps due to difficulty) Sidelying Lt shoulder ER 2 x 15 c towel at side Standing red band ER c flexion punch Lt arm x 10  Manual:  Compression to Lt infraspinatus trigger points Dry needling:  Twitch response noted from Lt infraspinatus c concordant Lt shoulder symptoms noted.  No adverse reaction noted.   Vasopneumatic   Lt shoulder 34 degrees medium compression 10 mins in sitting   06/28/2021: Therex: UBE fwd/back 4 mins each way Lvl 3.0 c 10 second faster interval end of each minute Prone t Lt 2 x 10 (after 5 of tactile cues passive movement) Pron y Lt 2 x 10 (after 5 of tactile cues passive movement)  Standing shoulder flexion, scaption 0-90 degrees (prior to shrug) 2 x 10 c slow movement control focus  Standing wall slides flexion 2-3 sec hold x 10 c lift off  Standing green band ER c slow movent focus - towel at side 2 x 15  Vasopneumatic   Lt shoulder 34 degrees medium compression 10 mins in sitting   06/22/2021: Therex:  Pulleys flexion, abduction 3 mins each way  Prone scaption hold 5 sec x 10  Prone scaption c GH ext hold 5 sec x 10  Prone T, y x 5 each (limited due to pain/difficulty so held)  Standing wall circles in 90 deg flexion cw, ccw 2 x 20 each way Lt arm  Wall push ups  2 x 10 with serratus anterior activation  Standing wall slides flexion 2-3 sec hold x 10  Standing green band ER c slow  movent focus - towel at side 2 x 15  Vasopneumatic   Lt shoulder 34 degrees medium compression 10 mins in sitting   PATIENT EDUCATION: 06/22/2021 Education details: HEP progression.  Person educated: Patient Education method: Explanation, Demonstration, Verbal cues,  and Handouts Education comprehension: verbalized understanding and returned demonstration     HOME EXERCISE PROGRAM: Access Code: [email or web address] URL: [email or web address] Date: 06/22/2021 Prepared by: Scot Jun  Exercises - Supine Shoulder External Rotation in 45 Degrees Abduction AAROM with Dowel  - 2 x daily - 7 x weekly - 1-2 sets - 10 reps - 5 hold - Sidelying Shoulder Abduction Palm Forward (Mirrored)  - 1-2 x daily - 7 x weekly - 2-3 sets - 10-15 reps - Standing Shoulder Row with Anchored Resistance  - 1-2 x daily - 7 x weekly - 3 sets - 10 reps - Shoulder Extension with Resistance  - 1-2 x daily - 7 x weekly - 3 sets - 10 reps - Supine Shoulder Circles with Weight  - 1-2 x daily - 7 x weekly - 1-2 sets - 30 reps - Standing Shoulder Flexion to 90 Degrees with Dumbbells  - 1-2 x daily - 7 x weekly - 1-2 sets - 10-15 reps - Prone Scapular Slide with Shoulder Extension  - 2 x daily - 7 x weekly - 1 sets - 10 reps - 5 hold - Shoulder External Rotation with Anchored Resistance  - 1-2 x daily - 7 x weekly - 2-3 sets - 10-15 reps   ASSESSMENT:   CLINICAL IMPRESSION: Concordant symptoms from Lt infraspinatus trigger points today.  Treated with manual and dry needling with positive response while in clinic.  Pt to continue to benefit from skilled PT services for progression of strength.     OBJECTIVE IMPAIRMENTS decreased activity tolerance, decreased coordination, decreased endurance, decreased mobility, decreased ROM, decreased strength, hypomobility, increased edema, impaired perceived functional ability, impaired UE functional use, improper body mechanics, postural dysfunction, and pain.    ACTIVITY  LIMITATIONS cleaning, community activity, driving, meal prep, occupation, laundry, and yard work.    REHAB POTENTIAL: Good   CLINICAL DECISION MAKING: Stable/uncomplicated   EVALUATION COMPLEXITY: Low     GOALS: Goals reviewed with patient? Yes Eval on 04/26/2021 Short term PT Goals (target date for Short term goals are 3 weeks 05/17/2021) Patient will demonstrate independent use of home exercise program to maintain progress from in clinic treatments. Goal status: MET   Long term PT goals (target dates for all long term goals are 10 weeks  07/05/2021 )   1. Patient will demonstrate/report pain at worst less than or equal to 2/10 to facilitate minimal limitation in daily activity secondary to pain symptoms. Goal status: partially met assessed 07/05/2021  2. Patient will demonstrate independent use of home exercise program to facilitate ability to maintain/progress functional gains from skilled physical therapy services. Goal status: : partially met assessed 07/05/2021   3. Patient will demonstrate FOTO outcome > or = 59 % to indicate reduced disability due to condition. Goal status: : partially met assessed 07/05/2021   4.  Patient will demonstrate Lt [facility] joint mobility WFL to facilitate usual self care, dressing, reaching overhead at PLOF s limitation due to symptoms.  Goal status: : partially met assessed 07/05/2021   5.  Patient will demonstrate Lt UE MMT 5/5 throughout to facilitate usual lifting, carrying in functional activity to PLOF s limitation.   Goal status: : partially met assessed 07/05/2021   6.  Patient will demonstrate return to work at [employer] s limitation.   Goal status: : partially met assessed 07/05/2021         PLAN: PT FREQUENCY:  2-3x/week   PT DURATION: 10 weeks   PLANNED INTERVENTIONS: Therapeutic exercises, Therapeutic activity,  Neuro Muscular re-education, Balance training, Gait training, Patient/Family education, Joint mobilization, Stair training, DME  instructions, Dry Needling, Electrical stimulation, Cryotherapy, Moist heat, Taping, Ultrasound, Ionotophoresis 72m/ml Dexamethasone, and Manual therapy.  All included unless contraindicated   PLAN FOR NEXT SESSION:  Recert next visit.  Possible dry needling.   MScot Jun PT, DPT, OCS, ATC 07/05/21  11:02 AM

## 2021-07-12 ENCOUNTER — Encounter: Payer: No Typology Code available for payment source | Admitting: Rehabilitative and Restorative Service Providers"

## 2021-07-15 ENCOUNTER — Ambulatory Visit: Payer: No Typology Code available for payment source | Admitting: Rehabilitative and Restorative Service Providers"

## 2021-07-15 ENCOUNTER — Encounter: Payer: Self-pay | Admitting: Rehabilitative and Restorative Service Providers"

## 2021-07-15 DIAGNOSIS — M25612 Stiffness of left shoulder, not elsewhere classified: Secondary | ICD-10-CM | POA: Diagnosis not present

## 2021-07-15 DIAGNOSIS — R600 Localized edema: Secondary | ICD-10-CM | POA: Diagnosis not present

## 2021-07-15 DIAGNOSIS — M6281 Muscle weakness (generalized): Secondary | ICD-10-CM | POA: Diagnosis not present

## 2021-07-15 DIAGNOSIS — G8929 Other chronic pain: Secondary | ICD-10-CM | POA: Diagnosis not present

## 2021-07-15 DIAGNOSIS — M25512 Pain in left shoulder: Secondary | ICD-10-CM

## 2021-07-15 DIAGNOSIS — R293 Abnormal posture: Secondary | ICD-10-CM | POA: Diagnosis not present

## 2021-07-15 NOTE — Therapy (Signed)
OUTPATIENT PHYSICAL THERAPY TREATMENT NOTE /PROGRESS NOTE/ [place]  Patient Name: Dennis Villarreal MRN: [id number] DOB:1951-01-23, 71 y.o., male Today's Date: 07/15/2021  PCP: [name], MD REFERRING PROVIDER: Meredith Pel, MD  Progress Note Reporting Period 06/09/2021 to 07/15/2021  See note below for Objective Data and Assessment of Progress/Goals.    END OF SESSION:   PT End of Session - 07/15/21 0922     Visit Number 19    Number of Visits 24    Date for PT Re-Evaluation 08/26/21    Authorization Type [employer] $40 copay    Progress Note Due on Visit 20    PT Start Time 0924    PT Stop Time 1015    PT Time Calculation (min) 51 min    Activity Tolerance Patient tolerated treatment well    Behavior During Therapy [facility] for tasks assessed/performed             Past Medical History:  Diagnosis Date   ALLERGIC RHINITIS 09/04/2009   Basal cell carcinoma    Cancer ([place]) 2012   melanoma   DYSLIPIDEMIA 09/04/2009   GERD 09/04/2009   High cholesterol    just above borderline per patient    Past Surgical History:  Procedure Laterality Date   GALLBLADDER SURGERY  01/2018   SHOULDER ARTHROSCOPY WITH OPEN ROTATOR CUFF REPAIR AND DISTAL CLAVICLE ACROMINECTOMY Left 04/13/2021   Procedure: LEFT SHOULDER ARTHROSCOPY, DEBRIDEMENT, MINI OPEN ROTATOR CUFF TEAR REPAIR, BICEPS TENODESIS;  Surgeon: Meredith Pel, MD;  Location: [place];  Service: Orthopedics;  Laterality: Left;   [place], 2009   left shoulder scromoplasty   TONSILLECTOMY     TRIGGER FINGER RELEASE Left    Patient Active Problem List   Diagnosis Date Noted   Complete tear of left rotator cuff    Biceps tendonitis on left    Degenerative superior labral anterior-to-posterior (SLAP) tear of left shoulder    BPH (benign prostatic hyperplasia) 05/24/2017   Diplopia 03/18/2014   Dizziness and giddiness 03/18/2014   Vision loss 03/18/2014   Sinusitis 02/03/2013   Anxiety and  depression 09/13/2012   History of melanoma 06/30/2011   Hyperlipidemia 09/04/2009   ALLERGIC RHINITIS 09/04/2009   GERD 09/04/2009    THERAPY DIAG:  Chronic left shoulder pain  Stiffness of left shoulder, not elsewhere classified  Muscle weakness (generalized)  Localized edema  Abnormal posture    REFERRING DIAG: Z98.890 (ICD-10-CM) - History of arthroscopy of left shoulder Z98.890 (ICD-10-CM) - S/P rotator cuff repair  PERTINENT HISTORY: Had surgery 04/13/2021 for rotator cuff repair. History of melanoma    ONSET DATE: Surgery 04/13/2021   SUBJECTIVE:  SUBJECTIVE STATEMENT:   Pt indicated soreness for a few days after last visit but did report feeling overall improvement since last visit to today.  Indicated going to the gym and felt like he worked his arm a bit more.   PAIN:  NPRS scale: at worst 2/10 Pain location: Lt shoulder - anterior/lateral Pain description: achy Aggravating factors: achy after exercise Relieving factors: nothing specific    PRECAUTIONS: AROM progression.  No strengthening with resistance until 05/25/2021. 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    PLOF: Independent , yard care, Rt hand dominant   PATIENT GOALS Reduce pain, get back to work and hobbies   OBJECTIVE:    PATIENT SURVEYS:  07/15/2021:   FOTO update: 60   06/04/2021:  FOTO update:   60  04/26/2021: FOTO intake:  36 predicted: 30    COGNITION: 04/26/2021: Overall cognitive status: Within functional limits for tasks assessed                                     SENSATION:  04/26/2021 WFL   Edema:  04/26/2021: localized edema Lt shoulder   POSTURE: 05/24/2021: no  sling use  04/26/2021 Wearing sling    UPPER EXTREMITY ROM:    ROM Left PROM 04/26/2021 Left PROM  in supine 05/03/21 Left PROM 05/10/2021  Left PROM 05/17/2021 Left PROM 06/01/21 Left AROM 06/09/2021 Left AROM 06/17/22 supine Left 07/15/2021 AROM in supine  Shoulder flexion 95 PROM in supine, pain at end range 100 (muscle guarding and pain noted) 110 deg in supine 130 in supine 150 in supine 154 in supine  155 supine 154 supine  Shoulder extension           Shoulder abduction 90 PROM in supine, pain at end range 100  111 in supine 160 in supine and slight scaption 116 in supine 118 supine 120 in supine  Shoulder adduction           Shoulder internal rotation To belly  To belly    70 deg in 45 deg abduction supine 70 with shoulder abd to 45 deg 80 in 45 deg abduction supine  Shoulder external rotation 0 PROM in supine, pain at end range 10 PROM in 45 deg abduction 32 deg 48 deg in 45 deg abduction supine 65 deg in 45 deg abd supine  55 deg in 45 deg abduction supine  60 in 45 deg abduction supine  Elbow flexion           Elbow extension           Wrist flexion           Wrist extension           Wrist ulnar deviation           Wrist radial deviation           Wrist pronation           Wrist supination           (Blank rows = not tested)   UPPER EXTREMITY MMT:   MMT Right 04/26/2021 Left 04/26/2021 Not tested due to surgery protocol Left 05/10/2021 Left 05/24/2021 Left 06/07/2021 Right 06/09/2021 Left 06/09/2021 Left 06/22/2021 Left 07/15/2021  Shoulder flexion 5/5   1/5 2-/5  5/5 33, 35 lbs (tested in supine 90  deg flexion) 2+/5 c shrug noted against gravity  7, 9 lbs c pain (tested in supine 90 deg flexion) 3+/5  3+/5 c minimal shrug at end range  12.8, 13.3 lbs (tested in supine 90 deg flexion)  Shoulder extension             Shoulder abduction 5/5   1/5 2-/5   2/5  2+/5  Shoulder adduction             Shoulder internal rotation 5/5           Shoulder external rotation 5/5      13.3, 9.7 lbs (tested seated arm at side) 5/5 25, 26 lbs 3+/5 9.5,12 lbs  4/5 14.3, 15.1 lbs  Middle trapezius             Lower trapezius             Elbow flexion 5/5           Elbow extension 5/5           Wrist flexion             Wrist extension             Wrist ulnar deviation             Wrist radial deviation             Wrist pronation             Wrist supination             Grip strength (lbs)             (Blank rows = not tested)   JOINT MOBILITY TESTING:  04/26/2021 : early to mid range Lt Gh joint mobility assessment was normal for capsular mobility. Guarding was noted from musculature.    PALPATION:  04/26/2021 : mild tenderness surrounding Lt shoulder grossly.               TODAY'S TREATMENT:  07/15/2021: Therex: UBE fwd/back 3 mins each way Lvl 3.0 Standing green band ER slow eccentric focus LT shoulder 2 x 15 c towel at side Supine horizontal abduction green band 2 x 10  Sidelying Lt shoulder ER 2 x 15 c towel at side Sidelying Lt shoulder abduction 2 x 15 Standing shoulder flexion  0-90 degrees c slow lowering focus 2 x 15 bilateral  Manual:  Compression to Lt infraspinatus trigger points Dry needling:  Twitch response noted from Lt infraspinatus c concordant Lt shoulder symptoms noted.  No adverse reaction noted.   Vasopneumatic   Lt shoulder 34 degrees medium compression 10 mins in sitting   07/05/2021: Therex: UBE fwd/back 3 mins each way Lvl 3.0 Batca rows 10 lbs 2 x 15 Batca lat pull down x 15 15 lbs, x 15 20 lbs Prone t Lt  x 10 (after 5 of tactile cues passive movement) (held more reps due to difficulty) Sidelying Lt shoulder ER 2 x 15 c towel at side Standing red band ER c flexion punch Lt arm x 10  Manual:  Compression to Lt infraspinatus trigger points Dry needling:  Twitch response noted from Lt infraspinatus c concordant Lt shoulder symptoms noted.  No adverse reaction noted.   Vasopneumatic   Lt shoulder 34 degrees medium compression  10 mins in sitting   06/28/2021: Therex: UBE fwd/back 4 mins each way Lvl 3.0 c 10 second faster interval end of each minute Prone t Lt 2 x 10 (after 5 of  tactile cues passive movement) Pron y Lt 2 x 10 (after 5 of tactile cues passive movement)  Standing shoulder flexion, scaption 0-90 degrees (prior to shrug) 2 x 10 c slow movement control focus  Standing wall slides flexion 2-3 sec hold x 10 c lift off  Standing green band ER c slow movent focus - towel at side 2 x 15  Vasopneumatic   Lt shoulder 34 degrees medium compression 10 mins in sitting    PATIENT EDUCATION: 06/22/2021 Education details: HEP progression.  Person educated: Patient Education method: Explanation, Demonstration, Verbal cues, and Handouts Education comprehension: verbalized understanding and returned demonstration     HOME EXERCISE PROGRAM: Access Code: [email or web address] URL: [email or web address] Date: 07/15/2021 Prepared by: Scot Jun  Exercises - Supine Shoulder External Rotation in 45 Degrees Abduction AAROM with Dowel  - 2 x daily - 7 x weekly - 1-2 sets - 10 reps - 5 hold - Sidelying Shoulder Abduction Palm Forward (Mirrored)  - 1-2 x daily - 7 x weekly - 2-3 sets - 10-15 reps - Standing Shoulder Row with Anchored Resistance  - 1-2 x daily - 7 x weekly - 3 sets - 10 reps - Shoulder Extension with Resistance  - 1-2 x daily - 7 x weekly - 3 sets - 10 reps - Standing Shoulder Flexion to 90 Degrees with Dumbbells  - 1-2 x daily - 7 x weekly - 1-2 sets - 10-15 reps - Prone Scapular Slide with Shoulder Extension  - 2 x daily - 7 x weekly - 1 sets - 10 reps - 5 hold - Shoulder External Rotation with Anchored Resistance  - 1-2 x daily - 7 x weekly - 2-3 sets - 10-15 reps - Supine Shoulder Horizontal Abduction with Resistance  - 1-2 x daily - 7 x weekly - 1-2 sets - 10-15 reps   ASSESSMENT:   CLINICAL IMPRESSION: Pt has attended 19 visits overall during course of treatment, reporting mild symptoms  mostly at this time related to Lt shoulder.  See objective data for updated information.  Current progress noted in mobility and strength but continued evidence of strength deficits in Lt shoulder/arm that can impair functional movement.  Due to weakness, continued skilled PT services recommended at this time to progress towards goals and improved functional movement.    OBJECTIVE IMPAIRMENTS decreased activity tolerance, decreased coordination, decreased endurance, decreased mobility, decreased ROM, decreased strength, hypomobility, increased edema, impaired perceived functional ability, impaired UE functional use, improper body mechanics, postural dysfunction, and pain.    ACTIVITY LIMITATIONS cleaning, community activity, driving, meal prep, occupation, laundry, and yard work.    REHAB POTENTIAL: Good   CLINICAL DECISION MAKING: Stable/uncomplicated   EVALUATION COMPLEXITY: Low     GOALS: Goals reviewed with patient? Yes Eval on 04/26/2021 Short term PT Goals (target date for Short term goals are 3 weeks 05/17/2021) Patient will demonstrate independent use of home exercise program to maintain progress from in clinic treatments. Goal status: MET   Long term PT goals (target dates for all long term goals are 10 weeks  08/26/2021 )   1. Patient will demonstrate/report pain at worst less than or equal to 2/10 to facilitate minimal limitation in daily activity secondary to pain symptoms. Goal status: revised - 08/26/2021  2. Patient will demonstrate independent use of home exercise program to facilitate ability to maintain/progress functional gains from skilled physical therapy services. Goal status: :  revised - 08/26/2021   3. Patient will demonstrate FOTO outcome > or = 59 %  to indicate reduced disability due to condition. Goal status: :  MET - 08/26/2021   4.  Patient will demonstrate Lt [facility] joint mobility WFL to facilitate usual self care, dressing, reaching overhead at PLOF s limitation due to  symptoms.  Goal status: :  revised - 08/26/2021   5.  Patient will demonstrate Lt UE MMT 5/5 throughout to facilitate usual lifting, carrying in functional activity to PLOF s limitation.   Goal status: :  revised - 08/26/2021   6.  Patient will demonstrate return to work at [employer] s limitation.   Goal status: :  revised - 08/26/2021        PLAN: PT FREQUENCY:  1x/week   PT DURATION: 6 weeks   PLANNED INTERVENTIONS: Therapeutic exercises, Therapeutic activity, Neuro Muscular re-education, Balance training, Gait training, Patient/Family education, Joint mobilization, Stair training, DME instructions, Dry Needling, Electrical stimulation, Cryotherapy, Moist heat, Taping, Ultrasound, Ionotophoresis 60m/ml Dexamethasone, and Manual therapy.  All included unless contraindicated   PLAN FOR NEXT SESSION:  Continue functional strength for Lt arm to improve towards goals.  DN as desired.   MScot Jun PT, DPT, OCS, ATC 07/15/21  10:04 AM

## 2021-07-18 ENCOUNTER — Other Ambulatory Visit: Payer: Self-pay | Admitting: Family Medicine

## 2021-07-19 ENCOUNTER — Encounter: Payer: Self-pay | Admitting: Rehabilitative and Restorative Service Providers"

## 2021-07-19 ENCOUNTER — Ambulatory Visit: Payer: No Typology Code available for payment source | Admitting: Rehabilitative and Restorative Service Providers"

## 2021-07-19 DIAGNOSIS — R293 Abnormal posture: Secondary | ICD-10-CM | POA: Diagnosis not present

## 2021-07-19 DIAGNOSIS — G8929 Other chronic pain: Secondary | ICD-10-CM | POA: Diagnosis not present

## 2021-07-19 DIAGNOSIS — M25512 Pain in left shoulder: Secondary | ICD-10-CM

## 2021-07-19 DIAGNOSIS — M6281 Muscle weakness (generalized): Secondary | ICD-10-CM | POA: Diagnosis not present

## 2021-07-19 DIAGNOSIS — R600 Localized edema: Secondary | ICD-10-CM | POA: Diagnosis not present

## 2021-07-19 DIAGNOSIS — M25612 Stiffness of left shoulder, not elsewhere classified: Secondary | ICD-10-CM | POA: Diagnosis not present

## 2021-07-19 NOTE — Therapy (Signed)
OUTPATIENT PHYSICAL THERAPY TREATMENT NOTE  Patient Name: Dennis Villarreal MRN: [id number] DOB:02/08/1950, 71 y.o., male Today's Date: 07/19/2021  PCP: Kristian Covey, MD REFERRING PROVIDER: Cammy Copa, MD  END OF SESSION:   PT End of Session - 07/19/21 1020     Visit Number 20    Number of Visits 24    Date for PT Re-Evaluation 08/26/21    Authorization Type [employer] $40 copay    Progress Note Due on Visit 20    PT Start Time 1015    PT Stop Time 1105    PT Time Calculation (min) 50 min    Activity Tolerance Patient limited by fatigue    Behavior During Therapy [facility] for tasks assessed/performed              Past Medical History:  Diagnosis Date   ALLERGIC RHINITIS 09/04/2009   Basal cell carcinoma    Cancer (HCC) 2012   melanoma   DYSLIPIDEMIA 09/04/2009   GERD 09/04/2009   High cholesterol    just above borderline per patient    Past Surgical History:  Procedure Laterality Date   GALLBLADDER SURGERY  01/2018   SHOULDER ARTHROSCOPY WITH OPEN ROTATOR CUFF REPAIR AND DISTAL CLAVICLE ACROMINECTOMY Left 04/13/2021   Procedure: LEFT SHOULDER ARTHROSCOPY, DEBRIDEMENT, MINI OPEN ROTATOR CUFF TEAR REPAIR, BICEPS TENODESIS;  Surgeon: Cammy Copa, MD;  Location: [facility] OR;  Service: Orthopedics;  Laterality: Left;   SHOULDER SURGERY  1986, 2009   left shoulder scromoplasty   TONSILLECTOMY     TRIGGER FINGER RELEASE Left    Patient Active Problem List   Diagnosis Date Noted   Complete tear of left rotator cuff    Biceps tendonitis on left    Degenerative superior labral anterior-to-posterior (SLAP) tear of left shoulder    BPH (benign prostatic hyperplasia) 05/24/2017   Diplopia 03/18/2014   Dizziness and giddiness 03/18/2014   Vision loss 03/18/2014   Sinusitis 02/03/2013   Anxiety and depression 09/13/2012   History of melanoma 06/30/2011   Hyperlipidemia 09/04/2009   ALLERGIC RHINITIS 09/04/2009   GERD 09/04/2009    THERAPY DIAG:   Chronic left shoulder pain  Stiffness of left shoulder, not elsewhere classified  Muscle weakness (generalized)  Localized edema  Abnormal posture    REFERRING DIAG: Z98.890 (ICD-10-CM) - History of arthroscopy of left shoulder Z98.890 (ICD-10-CM) - S/P rotator cuff repair  PERTINENT HISTORY: Had surgery 04/13/2021 for rotator cuff repair. History of melanoma    ONSET DATE: Surgery 04/13/2021   SUBJECTIVE:  SUBJECTIVE STATEMENT:   "I can do things I couldn't do before" with some little pain/discomfort at times.  Range is getting better.  Lifting a little per Pt, not hurting from it.   PAIN:  NPRS scale: at worst 2/10 Pain location: Lt shoulder - anterior/lateral Pain description: achy Aggravating factors: achy after exercise Relieving factors: nothing specific    PRECAUTIONS: AROM progression.  No strengthening with resistance until 05/25/2021. 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  Various lifting/carrying activity.    PLOF: Independent , yard care, Rt hand dominant   PATIENT GOALS Reduce pain, get back to work and hobbies   OBJECTIVE:    PATIENT SURVEYS:  07/15/2021:   FOTO update: 60   06/04/2021:  FOTO update:   60  04/26/2021: FOTO intake:  36 predicted: 59    COGNITION: 04/26/2021: Overall cognitive status: Within functional limits for tasks assessed                                     SENSATION:  04/26/2021 WFL   Edema:  04/26/2021: localized edema Lt shoulder   POSTURE: 05/24/2021: no sling use  04/26/2021 Wearing sling    UPPER EXTREMITY ROM:    ROM Left PROM 04/26/2021 Left PROM  in supine 05/03/21 Left PROM 05/10/2021  Left PROM 05/17/2021 Left PROM 06/01/21 Left  AROM 06/09/2021 Left AROM 06/17/22 supine Left 07/15/2021 AROM in supine  Shoulder flexion 95 PROM in supine, pain at end range 100 (muscle guarding and pain noted) 110 deg in supine 130 in supine 150 in supine 154 in supine  155 supine 154 supine  Shoulder extension           Shoulder abduction 90 PROM in supine, pain at end range 100  111 in supine 160 in supine and slight scaption 116 in supine 118 supine 120 in supine  Shoulder adduction           Shoulder internal rotation To belly  To belly    70 deg in 45 deg abduction supine 70 with shoulder abd to 45 deg 80 in 45 deg abduction supine  Shoulder external rotation 0 PROM in supine, pain at end range 10 PROM in 45 deg abduction 32 deg 48 deg in 45 deg abduction supine 65 deg in 45 deg abd supine  55 deg in 45 deg abduction supine  60 in 45 deg abduction supine  Elbow flexion           Elbow extension           Wrist flexion           Wrist extension           Wrist ulnar deviation           Wrist radial deviation           Wrist pronation           Wrist supination           (Blank rows = not tested)   UPPER EXTREMITY MMT:   MMT Right 04/26/2021 Left 04/26/2021 Not tested due to surgery protocol Left 05/10/2021 Left 05/24/2021 Left 06/07/2021 Right 06/09/2021 Left 06/09/2021 Left 06/22/2021 Left 07/15/2021  Shoulder flexion 5/5   1/5 2-/5  5/5 33, 35 lbs (tested in supine 90 deg flexion) 2+/5 c shrug noted against gravity  7, 9 lbs c pain (tested in supine 90 deg flexion) 3+/5  3+/5 c minimal shrug at end range  12.8, 13.3 lbs (tested in supine 90 deg flexion)  Shoulder extension             Shoulder abduction 5/5   1/5 2-/5   2/5  2+/5  Shoulder adduction             Shoulder internal rotation 5/5           Shoulder external rotation 5/5     13.3, 9.7 lbs (tested seated arm at side) 5/5 25, 26 lbs 3+/5 9.5,12 lbs  4/5 14.3, 15.1 lbs  Middle trapezius             Lower trapezius             Elbow flexion 5/5            Elbow extension 5/5           Wrist flexion             Wrist extension             Wrist ulnar deviation             Wrist radial deviation             Wrist pronation             Wrist supination             Grip strength (lbs)             (Blank rows = not tested)   JOINT MOBILITY TESTING:  04/26/2021 : early to mid range Lt Gh joint mobility assessment was normal for capsular mobility. Guarding was noted from musculature.    PALPATION:  04/26/2021 : mild tenderness surrounding Lt shoulder grossly.               TODAY'S TREATMENT:  07/19/2021: Therex: Pulley flexion ,scaption 3 mins each way  Sidelying Lt shoulder ER 3 x 15 c towel at side 2 lbs  Supine horizontal abduction green band x 10 (stopped in second set at 5 due to fatigue, motion control loss) Supine posterior capsule cross arm stretch 15 sec x 5 Lt Doorway ER stretch in 90/90 15 sec x 5 Lt Standing wall slides into scaption (Y shape) x 10   Vasopneumatic   Lt shoulder 34 degrees medium compression 10 mins in sitting  07/15/2021: Therex: UBE fwd/back 3 mins each way Lvl 3.0 Standing green band ER slow eccentric focus LT shoulder 2 x 15 c towel at side Supine horizontal abduction green band 2 x 10  Sidelying Lt shoulder ER 2 x 15 c towel at side Sidelying Lt shoulder abduction 2 x 15 Standing shoulder flexion  0-90 degrees c slow lowering focus 2 x 15 bilateral  Manual:  Compression to Lt infraspinatus trigger points Dry needling:  Twitch response noted from Lt infraspinatus c concordant Lt shoulder symptoms noted.  No adverse reaction noted.   Vasopneumatic   Lt shoulder 34 degrees medium compression 10 mins in sitting   07/05/2021: Therex: UBE fwd/back 3 mins each way Lvl 3.0 [place] rows 10 lbs 2 x 15 [place] lat pull down x 15 15 lbs, x 15 20 lbs Prone t Lt  x 10 (after 5 of tactile cues passive movement) (held more reps due to difficulty) Sidelying Lt shoulder ER 2 x 15 c towel at side Standing red band ER  c  flexion punch Lt arm x 10  Manual:  Compression to Lt infraspinatus trigger points Dry needling:  Twitch response noted from Lt infraspinatus c concordant Lt shoulder symptoms noted.  No adverse reaction noted.   Vasopneumatic   Lt shoulder 34 degrees medium compression 10 mins in sitting    PATIENT EDUCATION: 06/22/2021 Education details: HEP progression.  Person educated: Patient Education method: Explanation, Demonstration, Verbal cues, and Handouts Education comprehension: verbalized understanding and returned demonstration     HOME EXERCISE PROGRAM: Access Code: [email or web address] URL: [email or web address] Date: 07/19/2021 Prepared by: Chyrel Masson  Exercises - Sidelying Shoulder Abduction Palm Forward (Mirrored)  - 1-2 x daily - 7 x weekly - 2-3 sets - 10-15 reps - Standing Shoulder Row with Anchored Resistance  - 1-2 x daily - 7 x weekly - 3 sets - 10 reps - Shoulder Extension with Resistance  - 1-2 x daily - 7 x weekly - 3 sets - 10 reps - Standing Shoulder Flexion to 90 Degrees with Dumbbells  - 1-2 x daily - 7 x weekly - 1-2 sets - 10-15 reps - Prone Scapular Slide with Shoulder Extension  - 2 x daily - 7 x weekly - 1 sets - 10 reps - 5 hold - Shoulder External Rotation with Anchored Resistance  - 1-2 x daily - 7 x weekly - 2-3 sets - 10-15 reps - Supine Shoulder Horizontal Abduction with Resistance  - 1-2 x daily - 7 x weekly - 1-2 sets - 10-15 reps - Standing Shoulder Posterior Capsule Stretch  - 2 x daily - 7 x weekly - 1 sets - 5 reps - 15-30 hold - Single Arm Doorway Pec Stretch at 90 Degrees Abduction (Mirrored)  - 2 x daily - 7 x weekly - 1 sets - 5 reps - 15-30 hold ASSESSMENT:   CLINICAL IMPRESSION: Fatigue noted today which provided challenge for progressive strengthening in elevation attempts.  Pt to continue from progressive strengthening c focus on higher reps at this time to improve neural recruitment.  Weakness most evident restriction at this  time.    OBJECTIVE IMPAIRMENTS decreased activity tolerance, decreased coordination, decreased endurance, decreased mobility, decreased ROM, decreased strength, hypomobility, increased edema, impaired perceived functional ability, impaired UE functional use, improper body mechanics, postural dysfunction, and pain.    ACTIVITY LIMITATIONS cleaning, community activity, driving, meal prep, occupation, laundry, and yard work.    REHAB POTENTIAL: Good   CLINICAL DECISION MAKING: Stable/uncomplicated   EVALUATION COMPLEXITY: Low     GOALS: Goals reviewed with patient? Yes Eval on 04/26/2021 Short term PT Goals (target date for Short term goals are 3 weeks 05/17/2021) Patient will demonstrate independent use of home exercise program to maintain progress from in clinic treatments. Goal status: MET   Long term PT goals (target dates for all long term goals are 10 weeks  08/26/2021 )   1. Patient will demonstrate/report pain at worst less than or equal to 2/10 to facilitate minimal limitation in daily activity secondary to pain symptoms. Goal status: on going - assessed 07/19/2021  2. Patient will demonstrate independent use of home exercise program to facilitate ability to maintain/progress functional gains from skilled physical therapy services. Goal status: :  on going - assessed 07/19/2021   3. Patient will demonstrate FOTO outcome > or = 59 % to indicate reduced disability due to condition. Goal status: :  MET    4.  Patient will demonstrate Lt [facility] joint mobility WFL to facilitate usual self care, dressing, reaching overhead at  PLOF s limitation due to symptoms.  Goal status: :  on going - assessed 07/19/2021   5.  Patient will demonstrate Lt UE MMT 5/5 throughout to facilitate usual lifting, carrying in functional activity to PLOF s limitation.   Goal status: :  on going - assessed 07/19/2021   6.  Patient will demonstrate return to work at [employer] s limitation.   Goal status: : on going -  assessed 07/19/2021        PLAN: PT FREQUENCY:  1x/week   PT DURATION: 6 weeks   PLANNED INTERVENTIONS: Therapeutic exercises, Therapeutic activity, Neuro Muscular re-education, Balance training, Gait training, Patient/Family education, Joint mobilization, Stair training, DME instructions, Dry Needling, Electrical stimulation, Cryotherapy, Moist heat, Taping, Ultrasound, Ionotophoresis 4mg /ml Dexamethasone, and Manual therapy.  All included unless contraindicated   PLAN FOR NEXT SESSION:  Continue functional strength for Lt arm to improve towards goals. Easy on resistance changes (build endurance first)  Chyrel Masson, PT, DPT, OCS, ATC 07/19/21  10:57 AM

## 2021-07-20 DIAGNOSIS — L298 Other pruritus: Secondary | ICD-10-CM | POA: Diagnosis not present

## 2021-07-20 DIAGNOSIS — Z08 Encounter for follow-up examination after completed treatment for malignant neoplasm: Secondary | ICD-10-CM | POA: Diagnosis not present

## 2021-07-20 DIAGNOSIS — Z789 Other specified health status: Secondary | ICD-10-CM | POA: Diagnosis not present

## 2021-07-20 DIAGNOSIS — L814 Other melanin hyperpigmentation: Secondary | ICD-10-CM | POA: Diagnosis not present

## 2021-07-20 DIAGNOSIS — L918 Other hypertrophic disorders of the skin: Secondary | ICD-10-CM | POA: Diagnosis not present

## 2021-07-20 DIAGNOSIS — L821 Other seborrheic keratosis: Secondary | ICD-10-CM | POA: Diagnosis not present

## 2021-07-20 DIAGNOSIS — Z85828 Personal history of other malignant neoplasm of skin: Secondary | ICD-10-CM | POA: Diagnosis not present

## 2021-07-20 DIAGNOSIS — D2262 Melanocytic nevi of left upper limb, including shoulder: Secondary | ICD-10-CM | POA: Diagnosis not present

## 2021-07-20 DIAGNOSIS — R208 Other disturbances of skin sensation: Secondary | ICD-10-CM | POA: Diagnosis not present

## 2021-07-20 DIAGNOSIS — Z85820 Personal history of malignant melanoma of skin: Secondary | ICD-10-CM | POA: Diagnosis not present

## 2021-07-20 DIAGNOSIS — L905 Scar conditions and fibrosis of skin: Secondary | ICD-10-CM | POA: Diagnosis not present

## 2021-07-20 DIAGNOSIS — L538 Other specified erythematous conditions: Secondary | ICD-10-CM | POA: Diagnosis not present

## 2021-07-26 ENCOUNTER — Encounter: Payer: No Typology Code available for payment source | Admitting: Physical Therapy

## 2021-07-29 ENCOUNTER — Ambulatory Visit: Payer: No Typology Code available for payment source | Admitting: Rehabilitative and Restorative Service Providers"

## 2021-07-29 ENCOUNTER — Encounter: Payer: Self-pay | Admitting: Rehabilitative and Restorative Service Providers"

## 2021-07-29 DIAGNOSIS — G8929 Other chronic pain: Secondary | ICD-10-CM

## 2021-07-29 DIAGNOSIS — R293 Abnormal posture: Secondary | ICD-10-CM | POA: Diagnosis not present

## 2021-07-29 DIAGNOSIS — R600 Localized edema: Secondary | ICD-10-CM

## 2021-07-29 DIAGNOSIS — M25612 Stiffness of left shoulder, not elsewhere classified: Secondary | ICD-10-CM

## 2021-07-29 DIAGNOSIS — M25512 Pain in left shoulder: Secondary | ICD-10-CM | POA: Diagnosis not present

## 2021-07-29 DIAGNOSIS — M6281 Muscle weakness (generalized): Secondary | ICD-10-CM | POA: Diagnosis not present

## 2021-07-29 NOTE — Therapy (Signed)
OUTPATIENT PHYSICAL THERAPY TREATMENT NOTE  Patient Name: Dennis Villarreal MRN: [id number] DOB:[birth year], 71 y.o., male Today's Date: 07/29/2021  PCP: [name], MD REFERRING PROVIDER: Meredith Pel, MD  END OF SESSION:   PT End of Session - 07/29/21 0813     Visit Number 21    Number of Visits 24    Date for PT Re-Evaluation 08/26/21    Authorization Type [employer] $40 copay    Progress Note Due on Visit 20    PT Start Time 0759    PT Stop Time 0838    PT Time Calculation (min) 39 min    Activity Tolerance Patient tolerated treatment well    Behavior During Therapy [facility] for tasks assessed/performed               Past Medical History:  Diagnosis Date   ALLERGIC RHINITIS 09/04/2009   Basal cell carcinoma    Cancer ([place]) 2012   melanoma   DYSLIPIDEMIA 09/04/2009   GERD 09/04/2009   High cholesterol    just above borderline per patient    Past Surgical History:  Procedure Laterality Date   GALLBLADDER SURGERY  01/2018   SHOULDER ARTHROSCOPY WITH OPEN ROTATOR CUFF REPAIR AND DISTAL CLAVICLE ACROMINECTOMY Left 04/13/2021   Procedure: LEFT SHOULDER ARTHROSCOPY, DEBRIDEMENT, MINI OPEN ROTATOR CUFF TEAR REPAIR, BICEPS TENODESIS;  Surgeon: Meredith Pel, MD;  Location: [place];  Service: Orthopedics;  Laterality: Left;   [place], 2009   left shoulder scromoplasty   TONSILLECTOMY     TRIGGER FINGER RELEASE Left    Patient Active Problem List   Diagnosis Date Noted   Complete tear of left rotator cuff    Biceps tendonitis on left    Degenerative superior labral anterior-to-posterior (SLAP) tear of left shoulder    BPH (benign prostatic hyperplasia) 05/24/2017   Diplopia 03/18/2014   Dizziness and giddiness 03/18/2014   Vision loss 03/18/2014   Sinusitis 02/03/2013   Anxiety and depression 09/13/2012   History of melanoma 06/30/2011   Hyperlipidemia 09/04/2009   ALLERGIC RHINITIS 09/04/2009   GERD 09/04/2009    THERAPY  DIAG:  Chronic left shoulder pain  Stiffness of left shoulder, not elsewhere classified  Muscle weakness (generalized)  Localized edema  Abnormal posture    REFERRING DIAG: Z98.890 (ICD-10-CM) - History of arthroscopy of left shoulder Z98.890 (ICD-10-CM) - S/P rotator cuff repair  PERTINENT HISTORY: Had surgery 04/13/2021 for rotator cuff repair. History of melanoma    ONSET DATE: Surgery 04/13/2021   SUBJECTIVE:  SUBJECTIVE STATEMENT:   "I can do things I couldn't do before" with some little pain/discomfort at times.  Range is getting better.  Lifting a little per Pt, not hurting from it.   PAIN:  NPRS scale: at worst 2/10 Pain location: Lt shoulder - anterior/lateral Pain description: achy Aggravating factors: achy after exercise Relieving factors: nothing specific    PRECAUTIONS: AROM progression.  No strengthening with resistance until 05/25/2021. 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    PLOF: Independent , yard care, Rt hand dominant   PATIENT GOALS Reduce pain, get back to work and hobbies   OBJECTIVE:    PATIENT SURVEYS:  07/15/2021:   FOTO update: 60   06/04/2021:  FOTO update:   60  04/26/2021: FOTO intake:  36 predicted: 44    COGNITION: 04/26/2021: Overall cognitive status: Within functional limits for tasks assessed                                     SENSATION:  04/26/2021 WFL   Edema:  04/26/2021: localized edema Lt shoulder   POSTURE: 05/24/2021: no sling use  04/26/2021 Wearing sling    UPPER EXTREMITY ROM:    ROM Left PROM 04/26/2021 Left PROM  in supine 05/03/21 Left PROM 05/10/2021  Left PROM 05/17/2021 Left PROM 06/01/21 Left  AROM 06/09/2021 Left AROM 06/17/22 supine Left 07/15/2021 AROM in supine  Shoulder flexion 95 PROM in supine, pain at end range 100 (muscle guarding and pain noted) 110 deg in supine 130 in supine 150 in supine 154 in supine  155 supine 154 supine  Shoulder extension           Shoulder abduction 90 PROM in supine, pain at end range 100  111 in supine 160 in supine and slight scaption 116 in supine 118 supine 120 in supine  Shoulder adduction           Shoulder internal rotation To belly  To belly    70 deg in 45 deg abduction supine 70 with shoulder abd to 45 deg 80 in 45 deg abduction supine  Shoulder external rotation 0 PROM in supine, pain at end range 10 PROM in 45 deg abduction 32 deg 48 deg in 45 deg abduction supine 65 deg in 45 deg abd supine  55 deg in 45 deg abduction supine  60 in 45 deg abduction supine  Elbow flexion           Elbow extension           Wrist flexion           Wrist extension           Wrist ulnar deviation           Wrist radial deviation           Wrist pronation           Wrist supination           (Blank rows = not tested)   UPPER EXTREMITY MMT:   MMT Right 04/26/2021 Left 04/26/2021 Not tested due to surgery protocol Left 05/10/2021 Left 05/24/2021 Left 06/07/2021 Right 06/09/2021 Left 06/09/2021 Left 06/22/2021 Left 07/15/2021 Left 07/29/2021  Shoulder flexion 5/5   1/5 2-/5  5/5 33, 35 lbs (tested in supine 90 deg flexion) 2+/5 c shrug noted  against gravity  7, 9 lbs c pain (tested in supine 90 deg flexion) 3+/5  3+/5 c minimal shrug at end range  12.8, 13.3 lbs (tested in supine 90 deg flexion) 3+/5  Shoulder extension              Shoulder abduction 5/5   1/5 2-/5   2/5  2+/5   Shoulder adduction              Shoulder internal rotation 5/5            Shoulder external rotation 5/5     13.3, 9.7 lbs (tested seated arm at side) 5/5 25, 26 lbs 3+/5 9.5,12 lbs  4/5 14.3, 15.1 lbs   Middle trapezius              Lower trapezius              Elbow  flexion 5/5            Elbow extension 5/5            Wrist flexion              Wrist extension              Wrist ulnar deviation              Wrist radial deviation              Wrist pronation              Wrist supination              Grip strength (lbs)              (Blank rows = not tested)   JOINT MOBILITY TESTING:  04/26/2021 : early to mid range Lt Gh joint mobility assessment was normal for capsular mobility. Guarding was noted from musculature.    PALPATION:  04/26/2021 : mild tenderness surrounding Lt shoulder grossly.               TODAY'S TREATMENT:  07/29/2021: Therex: UBE fwd/back 4 mins each way lvl 3.0  Standing wall slides into scaption (Y shape) x 10   Standing wall horizontal abduction c thoracic rotation in lunge positioning x 10 bilateral green band  Standing red pball 90 deg flexion circles cw, ccw 2 x 30 each way Standing red band ER c flexion punch 2 x 10  Eccentric lowering only Lt arm 90-0 degrees flexion, scaption 1 lb x 10 each c slow control focus   07/19/2021: Therex: Pulley flexion ,scaption 3 mins each way  Sidelying Lt shoulder ER 3 x 15 c towel at side 2 lbs  Supine horizontal abduction green band x 10 (stopped in second set at 5 due to fatigue, motion control loss) Supine posterior capsule cross arm stretch 15 sec x 5 Lt Doorway ER stretch in 90/90 15 sec x 5 Lt Standing wall slides into scaption (Y shape) x 10   Vasopneumatic   Lt shoulder 34 degrees medium compression 10 mins in sitting  07/15/2021: Therex: UBE fwd/back 3 mins each way Lvl 3.0 Standing green band ER slow eccentric focus LT shoulder 2 x 15 c towel at side Supine horizontal abduction green band 2 x 10  Sidelying Lt shoulder ER 2 x 15 c towel at side Sidelying Lt shoulder abduction 2 x 15 Standing shoulder flexion  0-90 degrees c slow lowering focus 2 x 15 bilateral  Manual:  Compression to Lt infraspinatus trigger  points Dry needling:  Twitch response noted from Lt  infraspinatus c concordant Lt shoulder symptoms noted.  No adverse reaction noted.   Vasopneumatic   Lt shoulder 34 degrees medium compression 10 mins in sitting    PATIENT EDUCATION: 06/22/2021 Education details: HEP progression.  Person educated: Patient Education method: Explanation, Demonstration, Verbal cues, and Handouts Education comprehension: verbalized understanding and returned demonstration     HOME EXERCISE PROGRAM: Access Code: [email or web address] URL: [email or web address] Date: 07/19/2021 Prepared by: Scot Jun  Exercises - Sidelying Shoulder Abduction Palm Forward (Mirrored)  - 1-2 x daily - 7 x weekly - 2-3 sets - 10-15 reps - Standing Shoulder Row with Anchored Resistance  - 1-2 x daily - 7 x weekly - 3 sets - 10 reps - Shoulder Extension with Resistance  - 1-2 x daily - 7 x weekly - 3 sets - 10 reps - Standing Shoulder Flexion to 90 Degrees with Dumbbells  - 1-2 x daily - 7 x weekly - 1-2 sets - 10-15 reps - Prone Scapular Slide with Shoulder Extension  - 2 x daily - 7 x weekly - 1 sets - 10 reps - 5 hold - Shoulder External Rotation with Anchored Resistance  - 1-2 x daily - 7 x weekly - 2-3 sets - 10-15 reps - Supine Shoulder Horizontal Abduction with Resistance  - 1-2 x daily - 7 x weekly - 1-2 sets - 10-15 reps - Standing Shoulder Posterior Capsule Stretch  - 2 x daily - 7 x weekly - 1 sets - 5 reps - 15-30 hold - Single Arm Doorway Pec Stretch at 90 Degrees Abduction (Mirrored)  - 2 x daily - 7 x weekly - 1 sets - 5 reps - 15-30 hold  ASSESSMENT:   CLINICAL IMPRESSION: Multidirectional movement patterns for Lt arm created early fatigue due to weakness compared to Rt.  Improvement in single plane movements noted (been used in HEP).  Continued progressive strengthening and functional movement pattern improvements.     OBJECTIVE IMPAIRMENTS decreased activity tolerance, decreased coordination, decreased endurance, decreased mobility, decreased ROM,  decreased strength, hypomobility, increased edema, impaired perceived functional ability, impaired UE functional use, improper body mechanics, postural dysfunction, and pain.    ACTIVITY LIMITATIONS cleaning, community activity, driving, meal prep, occupation, laundry, and yard work.    REHAB POTENTIAL: Good   CLINICAL DECISION MAKING: Stable/uncomplicated   EVALUATION COMPLEXITY: Low     GOALS: Goals reviewed with patient? Yes Eval on 04/26/2021 Short term PT Goals (target date for Short term goals are 3 weeks 05/17/2021) Patient will demonstrate independent use of home exercise program to maintain progress from in clinic treatments. Goal status: MET   Long term PT goals (target dates for all long term goals are 10 weeks  08/26/2021 )   1. Patient will demonstrate/report pain at worst less than or equal to 2/10 to facilitate minimal limitation in daily activity secondary to pain symptoms. Goal status: on going - assessed 07/19/2021  2. Patient will demonstrate independent use of home exercise program to facilitate ability to maintain/progress functional gains from skilled physical therapy services. Goal status: :  on going - assessed 07/19/2021   3. Patient will demonstrate FOTO outcome > or = 59 % to indicate reduced disability due to condition. Goal status: :  MET    4.  Patient will demonstrate Lt [facility] joint mobility WFL to facilitate usual self care, dressing, reaching overhead at PLOF s limitation due to symptoms.  Goal status: :  on going - assessed  07/19/2021   5.  Patient will demonstrate Lt UE MMT 5/5 throughout to facilitate usual lifting, carrying in functional activity to PLOF s limitation.   Goal status: :  on going - assessed 07/19/2021   6.  Patient will demonstrate return to work at [employer] s limitation.   Goal status: : on going - assessed 07/19/2021        PLAN: PT FREQUENCY:  1x/week   PT DURATION: 6 weeks   PLANNED INTERVENTIONS: Therapeutic exercises, Therapeutic  activity, Neuro Muscular re-education, Balance training, Gait training, Patient/Family education, Joint mobilization, Stair training, DME instructions, Dry Needling, Electrical stimulation, Cryotherapy, Moist heat, Taping, Ultrasound, Ionotophoresis 11m/ml Dexamethasone, and Manual therapy.  All included unless contraindicated   PLAN FOR NEXT SESSION:  Continue functional strength for Lt arm to improve towards goals. Easy on resistance changes (avoid shrugs)  MScot Jun PT, DPT, OCS, ATC 07/29/21  8:39 AM

## 2021-08-02 ENCOUNTER — Other Ambulatory Visit: Payer: Self-pay | Admitting: Family Medicine

## 2021-08-02 ENCOUNTER — Encounter: Payer: No Typology Code available for payment source | Admitting: Rehabilitative and Restorative Service Providers"

## 2021-08-03 ENCOUNTER — Encounter: Payer: Self-pay | Admitting: Rehabilitative and Restorative Service Providers"

## 2021-08-03 ENCOUNTER — Ambulatory Visit: Payer: No Typology Code available for payment source | Admitting: Rehabilitative and Restorative Service Providers"

## 2021-08-03 DIAGNOSIS — M25512 Pain in left shoulder: Secondary | ICD-10-CM | POA: Diagnosis not present

## 2021-08-03 DIAGNOSIS — R600 Localized edema: Secondary | ICD-10-CM | POA: Diagnosis not present

## 2021-08-03 DIAGNOSIS — M6281 Muscle weakness (generalized): Secondary | ICD-10-CM

## 2021-08-03 DIAGNOSIS — M25612 Stiffness of left shoulder, not elsewhere classified: Secondary | ICD-10-CM

## 2021-08-03 DIAGNOSIS — G8929 Other chronic pain: Secondary | ICD-10-CM

## 2021-08-03 DIAGNOSIS — R293 Abnormal posture: Secondary | ICD-10-CM | POA: Diagnosis not present

## 2021-08-03 NOTE — Therapy (Signed)
OUTPATIENT PHYSICAL THERAPY TREATMENT NOTE  Patient Name: Dennis Villarreal MRN: [id number] DOB:1950-07-12, 71 y.o., male Today's Date: 08/03/2021  PCP: [name], MD REFERRING PROVIDER: Meredith Pel, MD  END OF SESSION:   PT End of Session - 08/03/21 1057     Visit Number 22    Number of Visits 24    Date for PT Re-Evaluation 08/26/21    Authorization Type [employer] $40 copay    Progress Note Due on Visit 20    PT Start Time 1057    PT Stop Time 1136    PT Time Calculation (min) 39 min    Activity Tolerance Patient tolerated treatment well    Behavior During Therapy [facility] for tasks assessed/performed                Past Medical History:  Diagnosis Date   ALLERGIC RHINITIS 09/04/2009   Basal cell carcinoma    Cancer ([place]) 2012   melanoma   DYSLIPIDEMIA 09/04/2009   GERD 09/04/2009   High cholesterol    just above borderline per patient    Past Surgical History:  Procedure Laterality Date   GALLBLADDER SURGERY  01/2018   SHOULDER ARTHROSCOPY WITH OPEN ROTATOR CUFF REPAIR AND DISTAL CLAVICLE ACROMINECTOMY Left 04/13/2021   Procedure: LEFT SHOULDER ARTHROSCOPY, DEBRIDEMENT, MINI OPEN ROTATOR CUFF TEAR REPAIR, BICEPS TENODESIS;  Surgeon: Meredith Pel, MD;  Location: [facility];  Service: Orthopedics;  Laterality: Left;   [place], 2009   left shoulder scromoplasty   TONSILLECTOMY     TRIGGER FINGER RELEASE Left    Patient Active Problem List   Diagnosis Date Noted   Complete tear of left rotator cuff    Biceps tendonitis on left    Degenerative superior labral anterior-to-posterior (SLAP) tear of left shoulder    BPH (benign prostatic hyperplasia) 05/24/2017   Diplopia 03/18/2014   Dizziness and giddiness 03/18/2014   Vision loss 03/18/2014   Sinusitis 02/03/2013   Anxiety and depression 09/13/2012   History of melanoma 06/30/2011   Hyperlipidemia 09/04/2009   ALLERGIC RHINITIS 09/04/2009   GERD 09/04/2009    THERAPY  DIAG:  Chronic left shoulder pain  Stiffness of left shoulder, not elsewhere classified  Muscle weakness (generalized)  Localized edema  Abnormal posture    REFERRING DIAG: Z98.890 (ICD-10-CM) - History of arthroscopy of left shoulder Z98.890 (ICD-10-CM) - S/P rotator cuff repair  PERTINENT HISTORY: Had surgery 04/13/2021 for rotator cuff repair. History of melanoma    ONSET DATE: Surgery 04/13/2021   SUBJECTIVE:  SUBJECTIVE STATEMENT:   Pt indicated no pain complaints upon arrival today.  Pt indicated he was working on a box at work that created a "strain" in his shoulder and felt it for a couple days.   PAIN:  NPRS scale: 0/10 upon arrival.  Pain location: Lt shoulder - anterior/lateral Pain description: achy Aggravating factors: achy after exercise Relieving factors: nothing specific    PRECAUTIONS: AROM progression.  No strengthening with resistance until 05/25/2021. 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  Various lifting/carrying activity.    PLOF: Independent , yard care, Rt hand dominant   PATIENT GOALS Reduce pain, get back to work and hobbies   OBJECTIVE:    PATIENT SURVEYS:  07/15/2021:   FOTO update: 60   06/04/2021:  FOTO update:   60  04/26/2021: FOTO intake:  36 predicted: 56    COGNITION: 04/26/2021: Overall cognitive status: Within functional limits for tasks assessed                                     SENSATION:  04/26/2021 WFL   Edema:  04/26/2021: localized edema Lt shoulder   POSTURE: 05/24/2021: no sling use  04/26/2021 Wearing sling    UPPER EXTREMITY ROM:    ROM Left PROM 04/26/2021 Left PROM  in supine 05/03/21 Left PROM 05/10/2021  Left PROM 05/17/2021 Left  PROM 06/01/21 Left AROM 06/09/2021 Left AROM 06/17/22 supine Left 07/15/2021 AROM in supine  Shoulder flexion 95 PROM in supine, pain at end range 100 (muscle guarding and pain noted) 110 deg in supine 130 in supine 150 in supine 154 in supine  155 supine 154 supine  Shoulder extension           Shoulder abduction 90 PROM in supine, pain at end range 100  111 in supine 160 in supine and slight scaption 116 in supine 118 supine 120 in supine  Shoulder adduction           Shoulder internal rotation To belly  To belly    70 deg in 45 deg abduction supine 70 with shoulder abd to 45 deg 80 in 45 deg abduction supine  Shoulder external rotation 0 PROM in supine, pain at end range 10 PROM in 45 deg abduction 32 deg 48 deg in 45 deg abduction supine 65 deg in 45 deg abd supine  55 deg in 45 deg abduction supine  60 in 45 deg abduction supine  Elbow flexion           Elbow extension           Wrist flexion           Wrist extension           Wrist ulnar deviation           Wrist radial deviation           Wrist pronation           Wrist supination           (Blank rows = not tested)   UPPER EXTREMITY MMT:   MMT Right 04/26/2021 Left 04/26/2021 Not tested due to surgery protocol Left 05/10/2021 Left 05/24/2021 Left 06/07/2021 Right 06/09/2021 Left 06/09/2021 Left 06/22/2021 Left 07/15/2021 Left 07/29/2021  Shoulder flexion 5/5   1/5 2-/5  5/5 33, 35 lbs (tested in supine 90 deg  flexion) 2+/5 c shrug noted against gravity  7, 9 lbs c pain (tested in supine 90 deg flexion) 3+/5  3+/5 c minimal shrug at end range  12.8, 13.3 lbs (tested in supine 90 deg flexion) 3+/5  Shoulder extension              Shoulder abduction 5/5   1/5 2-/5   2/5  2+/5   Shoulder adduction              Shoulder internal rotation 5/5            Shoulder external rotation 5/5     13.3, 9.7 lbs (tested seated arm at side) 5/5 25, 26 lbs 3+/5 9.5,12 lbs  4/5 14.3, 15.1 lbs   Middle trapezius              Lower trapezius               Elbow flexion 5/5            Elbow extension 5/5            Wrist flexion              Wrist extension              Wrist ulnar deviation              Wrist radial deviation              Wrist pronation              Wrist supination              Grip strength (lbs)              (Blank rows = not tested)   JOINT MOBILITY TESTING:  04/26/2021 : early to mid range Lt Gh joint mobility assessment was normal for capsular mobility. Guarding was noted from musculature.    PALPATION:  04/26/2021 : mild tenderness surrounding Lt shoulder grossly.               TODAY'S TREATMENT:  08/03/2021: Therex: UBE fwd/back 4 mins each way lvl 3.5 Lat pull down machine 20 lbs 2 x 15 Row machine 25 lbs 2 x 15  Standing green band ER c flexion punch 2 x 10  Eccentric lowering only Lt arm 90-0 degrees flexion 2 lb x 15, scaption 1 lb x 15 each c slow control focus Standing high green band attachment GH extension c palms forward 2 x 15 Counter height WB with contralateral arm shoulder touches x 15 bilateral Standing red pball 90 deg flexion, abduction circles cw, ccw 2 x 30 each way Counter height partial wall push up bilateral x 15 slow movement focus  07/29/2021: Therex: UBE fwd/back 4 mins each way lvl 3.0  Standing wall slides into scaption (Y shape) x 10   Standing wall horizontal abduction c thoracic rotation in lunge positioning x 10 bilateral green band  Standing red pball 90 deg flexion circles cw, ccw 2 x 30 each way Standing red band ER c flexion punch 2 x 10  Eccentric lowering only Lt arm 90-0 degrees flexion, scaption 1 lb x 10 each c slow control focus   07/19/2021: Therex: Pulley flexion ,scaption 3 mins each way  Sidelying Lt shoulder ER 3 x 15 c towel at side 2 lbs  Supine horizontal abduction green band x 10 (stopped in second set at 5 due to fatigue, motion control loss) Supine posterior capsule cross  arm stretch 15 sec x 5 Lt Doorway ER stretch in 90/90 15 sec x 5  Lt Standing wall slides into scaption (Y shape) x 10   Vasopneumatic   Lt shoulder 34 degrees medium compression 10 mins in sitting   PATIENT EDUCATION: 06/22/2021 Education details: HEP progression.  Person educated: Patient Education method: Explanation, Demonstration, Verbal cues, and Handouts Education comprehension: verbalized understanding and returned demonstration     HOME EXERCISE PROGRAM: Access Code: [email or web address] URL: [email or web address] Date: 07/19/2021 Prepared by: Scot Jun  Exercises - Sidelying Shoulder Abduction Palm Forward (Mirrored)  - 1-2 x daily - 7 x weekly - 2-3 sets - 10-15 reps - Standing Shoulder Row with Anchored Resistance  - 1-2 x daily - 7 x weekly - 3 sets - 10 reps - Shoulder Extension with Resistance  - 1-2 x daily - 7 x weekly - 3 sets - 10 reps - Standing Shoulder Flexion to 90 Degrees with Dumbbells  - 1-2 x daily - 7 x weekly - 1-2 sets - 10-15 reps - Prone Scapular Slide with Shoulder Extension  - 2 x daily - 7 x weekly - 1 sets - 10 reps - 5 hold - Shoulder External Rotation with Anchored Resistance  - 1-2 x daily - 7 x weekly - 2-3 sets - 10-15 reps - Supine Shoulder Horizontal Abduction with Resistance  - 1-2 x daily - 7 x weekly - 1-2 sets - 10-15 reps - Standing Shoulder Posterior Capsule Stretch  - 2 x daily - 7 x weekly - 1 sets - 5 reps - 15-30 hold - Single Arm Doorway Pec Stretch at 90 Degrees Abduction (Mirrored)  - 2 x daily - 7 x weekly - 1 sets - 5 reps - 15-30 hold  ASSESSMENT:   CLINICAL IMPRESSION: Noted improvement in elevation control c activity performed today compared to last visit.  Pt to trial HEP over next 2 weeks and recheck follow up for strength on next visit.    OBJECTIVE IMPAIRMENTS decreased activity tolerance, decreased coordination, decreased endurance, decreased mobility, decreased ROM, decreased strength, hypomobility, increased edema, impaired perceived functional ability, impaired UE  functional use, improper body mechanics, postural dysfunction, and pain.    ACTIVITY LIMITATIONS cleaning, community activity, driving, meal prep, occupation, laundry, and yard work.    REHAB POTENTIAL: Good   CLINICAL DECISION MAKING: Stable/uncomplicated   EVALUATION COMPLEXITY: Low     GOALS: Goals reviewed with patient? Yes Eval on 04/26/2021 Short term PT Goals (target date for Short term goals are 3 weeks 05/17/2021) Patient will demonstrate independent use of home exercise program to maintain progress from in clinic treatments. Goal status: MET   Long term PT goals (target dates for all long term goals are 10 weeks  08/26/2021 )   1. Patient will demonstrate/report pain at worst less than or equal to 2/10 to facilitate minimal limitation in daily activity secondary to pain symptoms. Goal status: on going - assessed 07/19/2021  2. Patient will demonstrate independent use of home exercise program to facilitate ability to maintain/progress functional gains from skilled physical therapy services. Goal status: :  on going - assessed 07/19/2021   3. Patient will demonstrate FOTO outcome > or = 59 % to indicate reduced disability due to condition. Goal status: :  MET    4.  Patient will demonstrate Lt [facility] joint mobility WFL to facilitate usual self care, dressing, reaching overhead at PLOF s limitation due to symptoms.  Goal status: :  on going - assessed 07/19/2021  5.  Patient will demonstrate Lt UE MMT 5/5 throughout to facilitate usual lifting, carrying in functional activity to PLOF s limitation.   Goal status: :  on going - assessed 07/19/2021   6.  Patient will demonstrate return to work at [employer] s limitation.   Goal status: : on going - assessed 07/19/2021        PLAN: PT FREQUENCY:  1x/week   PT DURATION: 6 weeks   PLANNED INTERVENTIONS: Therapeutic exercises, Therapeutic activity, Neuro Muscular re-education, Balance training, Gait training, Patient/Family education, Joint  mobilization, Stair training, DME instructions, Dry Needling, Electrical stimulation, Cryotherapy, Moist heat, Taping, Ultrasound, Ionotophoresis 50m/ml Dexamethasone, and Manual therapy.  All included unless contraindicated   PLAN FOR NEXT SESSION:  Check strengthening, possible HEP transitioning.   MScot Jun PT, DPT, OCS, ATC 08/03/21  11:33 AM

## 2021-08-04 ENCOUNTER — Encounter: Payer: No Typology Code available for payment source | Admitting: Rehabilitative and Restorative Service Providers"

## 2021-08-09 ENCOUNTER — Encounter: Payer: No Typology Code available for payment source | Admitting: Rehabilitative and Restorative Service Providers"

## 2021-08-10 ENCOUNTER — Encounter: Payer: No Typology Code available for payment source | Admitting: Rehabilitative and Restorative Service Providers"

## 2021-08-16 ENCOUNTER — Encounter: Payer: No Typology Code available for payment source | Admitting: Rehabilitative and Restorative Service Providers"

## 2021-08-17 ENCOUNTER — Encounter: Payer: No Typology Code available for payment source | Admitting: Rehabilitative and Restorative Service Providers"

## 2021-08-18 ENCOUNTER — Encounter: Payer: Self-pay | Admitting: Rehabilitative and Restorative Service Providers"

## 2021-08-18 ENCOUNTER — Ambulatory Visit: Payer: No Typology Code available for payment source | Admitting: Rehabilitative and Restorative Service Providers"

## 2021-08-18 DIAGNOSIS — R600 Localized edema: Secondary | ICD-10-CM

## 2021-08-18 DIAGNOSIS — R293 Abnormal posture: Secondary | ICD-10-CM

## 2021-08-18 DIAGNOSIS — G8929 Other chronic pain: Secondary | ICD-10-CM | POA: Diagnosis not present

## 2021-08-18 DIAGNOSIS — M6281 Muscle weakness (generalized): Secondary | ICD-10-CM

## 2021-08-18 DIAGNOSIS — M25612 Stiffness of left shoulder, not elsewhere classified: Secondary | ICD-10-CM | POA: Diagnosis not present

## 2021-08-18 DIAGNOSIS — M25512 Pain in left shoulder: Secondary | ICD-10-CM | POA: Diagnosis not present

## 2021-08-18 NOTE — Therapy (Addendum)
OUTPATIENT PHYSICAL THERAPY TREATMENT NOTE [place]  Patient Name: Dennis Villarreal MRN: [id number] DOB:May 21, 1950, 71 y.o., male Today's Date: 08/18/2021  PCP: [name], MD REFERRING PROVIDER: Meredith Pel, MD  END OF SESSION:   PT End of Session - 08/18/21 0849     Visit Number 23    Number of Visits 24    Date for PT Re-Evaluation 08/26/21    Authorization Type [employer] $40 copay    Progress Note Due on Visit 20    PT Start Time 0839    PT Stop Time 0927    PT Time Calculation (min) 48 min    Activity Tolerance Patient tolerated treatment well    Behavior During Therapy [facility] for tasks assessed/performed                 Past Medical History:  Diagnosis Date   ALLERGIC RHINITIS 09/04/2009   Basal cell carcinoma    Cancer ([place]) 2012   melanoma   DYSLIPIDEMIA 09/04/2009   GERD 09/04/2009   High cholesterol    just above borderline per patient    Past Surgical History:  Procedure Laterality Date   GALLBLADDER SURGERY  01/2018   SHOULDER ARTHROSCOPY WITH OPEN ROTATOR CUFF REPAIR AND DISTAL CLAVICLE ACROMINECTOMY Left 04/13/2021   Procedure: LEFT SHOULDER ARTHROSCOPY, DEBRIDEMENT, MINI OPEN ROTATOR CUFF TEAR REPAIR, BICEPS TENODESIS;  Surgeon: Meredith Pel, MD;  Location: [place];  Service: Orthopedics;  Laterality: Left;   [place], 2009   left shoulder scromoplasty   TONSILLECTOMY     TRIGGER FINGER RELEASE Left    Patient Active Problem List   Diagnosis Date Noted   Complete tear of left rotator cuff    Biceps tendonitis on left    Degenerative superior labral anterior-to-posterior (SLAP) tear of left shoulder    BPH (benign prostatic hyperplasia) 05/24/2017   Diplopia 03/18/2014   Dizziness and giddiness 03/18/2014   Vision loss 03/18/2014   Sinusitis 02/03/2013   Anxiety and depression 09/13/2012   History of melanoma 06/30/2011   Hyperlipidemia 09/04/2009   ALLERGIC RHINITIS 09/04/2009   GERD 09/04/2009     THERAPY DIAG:  Chronic left shoulder pain  Stiffness of left shoulder, not elsewhere classified  Muscle weakness (generalized)  Localized edema  Abnormal posture    REFERRING DIAG: Z98.890 (ICD-10-CM) - History of arthroscopy of left shoulder Z98.890 (ICD-10-CM) - S/P rotator cuff repair  PERTINENT HISTORY: Had surgery 04/13/2021 for rotator cuff repair. History of melanoma    ONSET DATE: Surgery 04/13/2021   SUBJECTIVE:  SUBJECTIVE STATEMENT:   Pt reported feeling good improvement and have several days where he didn't think about having surgery on his arm.  Pt indicated feeling confident to trial HEP.    PAIN:  NPRS scale: 0/10 upon arrival.  Pain location: Lt shoulder - anterior/lateral Pain description: achy Aggravating factors: achy after exercise Relieving factors: nothing specific    PRECAUTIONS: AROM progression.  No strengthening with resistance until 05/25/2021. 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    PLOF: Independent , yard care, Rt hand dominant   PATIENT GOALS Reduce pain, get back to work and hobbies   OBJECTIVE:    PATIENT SURVEYS:  08/18/2021:  FOTO update:  68  07/15/2021:   FOTO update: 60   06/04/2021:  FOTO update:   60  04/26/2021: FOTO intake:  36 predicted: 59    COGNITION: 04/26/2021: Overall cognitive status: Within functional limits for tasks assessed                                     SENSATION:  04/26/2021 WFL   Edema:  04/26/2021: localized edema Lt shoulder   POSTURE: 05/24/2021: no sling use  04/26/2021 Wearing sling    UPPER EXTREMITY ROM:    ROM Left PROM 04/26/2021 Left PROM  in supine 05/03/21 Left  PROM 05/10/2021  Left PROM 05/17/2021 Left PROM 06/01/21 Left AROM 06/09/2021 Left AROM 06/17/22 supine Left 07/15/2021 AROM in supine  Shoulder flexion 95 PROM in supine, pain at end range 100 (muscle guarding and pain noted) 110 deg in supine 130 in supine 150 in supine 154 in supine  155 supine 154 supine  Shoulder extension           Shoulder abduction 90 PROM in supine, pain at end range 100  111 in supine 160 in supine and slight scaption 116 in supine 118 supine 120 in supine  Shoulder adduction           Shoulder internal rotation To belly  To belly    70 deg in 45 deg abduction supine 70 with shoulder abd to 45 deg 80 in 45 deg abduction supine  Shoulder external rotation 0 PROM in supine, pain at end range 10 PROM in 45 deg abduction 32 deg 48 deg in 45 deg abduction supine 65 deg in 45 deg abd supine  55 deg in 45 deg abduction supine  60 in 45 deg abduction supine  Elbow flexion           Elbow extension           Wrist flexion           Wrist extension           Wrist ulnar deviation           Wrist radial deviation           Wrist pronation           Wrist supination           (Blank rows = not tested)   UPPER EXTREMITY MMT:   MMT Right 04/26/2021 Left 04/26/2021 Not tested due to surgery protocol Left 05/10/2021 Left 05/24/2021 Left 06/07/2021 Right 06/09/2021 Left 06/09/2021 Left 06/22/2021 Left 07/15/2021 Left 07/29/2021 Left 08/18/2021  Shoulder flexion 5/5   1/5 2-/5  5/5 33, 35 lbs (tested  in supine 90 deg flexion) 2+/5 c shrug noted against gravity  7, 9 lbs c pain (tested in supine 90 deg flexion) 3+/5  3+/5 c minimal shrug at end range  12.8, 13.3 lbs (tested in supine 90 deg flexion) 3+/5 4+/5  Shoulder extension               Shoulder abduction 5/5   1/5 2-/5   2/5  2+/5  4/5  Shoulder adduction               Shoulder internal rotation 5/5           5/5  Shoulder external rotation 5/5     13.3, 9.7 lbs (tested seated arm at side) 5/5 25, 26 lbs 3+/5 9.5,12  lbs  4/5 14.3, 15.1 lbs  5/5  Middle trapezius               Lower trapezius               Elbow flexion 5/5             Elbow extension 5/5             Wrist flexion               Wrist extension               Wrist ulnar deviation               Wrist radial deviation               Wrist pronation               Wrist supination               Grip strength (lbs)               (Blank rows = not tested)   JOINT MOBILITY TESTING:  04/26/2021 : early to mid range Lt Gh joint mobility assessment was normal for capsular mobility. Guarding was noted from musculature.    PALPATION:  04/26/2021 : mild tenderness surrounding Lt shoulder grossly.               TODAY'S TREATMENT:  08/18/2021: Therex: UBE fwd/back 4 mins each way lvl 3.5   Standing green band ER c flexion punch x 15  Standing green band ER c arm at side x 15  Standing scaption wall slide c lift off x 10 (2 sec hold)  Standing blue band rows x 15  Standing blue band gh ext x 15 Eccentric lowering only Lt arm 90-0 degrees flexion 2 lb 2 x 15, scaption 1 lb 2 x 15 each c slow control focus Doorway ER stretch 30 sec x 3 Lt arm  Review of existing HEP  Vasopneumatic Lt shoulder low compression 34 degrees 10 mins   08/03/2021: Therex: UBE fwd/back 4 mins each way lvl 3.5 Lat pull down machine 20 lbs 2 x 15 Row machine 25 lbs 2 x 15  Standing green band ER c flexion punch 2 x 10  Eccentric lowering only Lt arm 90-0 degrees flexion 2 lb x 15, scaption 1 lb x 15 each c slow control focus Standing high green band attachment GH extension c palms forward 2 x 15 Counter height WB with contralateral arm shoulder touches x 15 bilateral Standing red pball 90 deg flexion, abduction circles cw, ccw 2 x 30 each way Counter height partial wall push up bilateral x 15 slow movement focus  07/29/2021: Therex: UBE fwd/back 4 mins each way lvl 3.0  Standing wall slides into scaption (Y shape) x 10   Standing wall horizontal abduction c  thoracic rotation in lunge positioning x 10 bilateral green band  Standing red pball 90 deg flexion circles cw, ccw 2 x 30 each way Standing red band ER c flexion punch 2 x 10  Eccentric lowering only Lt arm 90-0 degrees flexion, scaption 1 lb x 10 each c slow control focus    PATIENT EDUCATION: 08/18/2021 Education details: Review of HEP Person educated: Patient Education method: Consulting civil engineer, Media planner, Verbal cues, and Handouts Education comprehension: verbalized understanding and returned demonstration     HOME EXERCISE PROGRAM: Access Code: [email or web address] URL: [email or web address] Date: 08/18/2021 Prepared by: Scot Jun  Exercises - Sidelying Shoulder Abduction Palm Forward (Mirrored)  - 1-2 x daily - 7 x weekly - 2-3 sets - 10-15 reps - Standing Shoulder Row with Anchored Resistance  - 1-2 x daily - 7 x weekly - 3 sets - 10 reps - Shoulder Extension with Resistance  - 1-2 x daily - 7 x weekly - 3 sets - 10 reps - Standing Shoulder Flexion to 90 Degrees with Dumbbells  - 1-2 x daily - 7 x weekly - 1-2 sets - 10-15 reps - Prone Scapular Slide with Shoulder Extension  - 2 x daily - 7 x weekly - 1 sets - 10 reps - 5 hold - Shoulder External Rotation with Anchored Resistance  - 1-2 x daily - 7 x weekly - 2-3 sets - 10-15 reps - Supine Shoulder Horizontal Abduction with Resistance  - 1-2 x daily - 7 x weekly - 1-2 sets - 10-15 reps - Standing Shoulder Posterior Capsule Stretch  - 2 x daily - 7 x weekly - 1 sets - 5 reps - 15-30 hold - Single Arm Doorway Pec Stretch at 90 Degrees Abduction (Mirrored)  - 2 x daily - 7 x weekly - 1 sets - 5 reps - 15-30 hold  ASSESSMENT:   CLINICAL IMPRESSION: Due to overall improvements noted and Pt reporting of improved symptoms, recommend trial transitioning to HEP at this time.  Good knowledge upon review of HEP and ideas for progression going forward.     OBJECTIVE IMPAIRMENTS decreased activity tolerance, decreased  coordination, decreased endurance, decreased mobility, decreased ROM, decreased strength, hypomobility, increased edema, impaired perceived functional ability, impaired UE functional use, improper body mechanics, postural dysfunction, and pain.    ACTIVITY LIMITATIONS cleaning, community activity, driving, meal prep, occupation, laundry, and yard work.    REHAB POTENTIAL: Good   CLINICAL DECISION MAKING: Stable/uncomplicated   EVALUATION COMPLEXITY: Low     GOALS: Goals reviewed with patient? Yes Eval on 04/26/2021 Short term PT Goals (target date for Short term goals are 3 weeks 05/17/2021) Patient will demonstrate independent use of home exercise program to maintain progress from in clinic treatments. Goal status: MET   Long term PT goals (target dates for all long term goals are 10 weeks  08/26/2021 )   1. Patient will demonstrate/report pain at worst less than or equal to 2/10 to facilitate minimal limitation in daily activity secondary to pain symptoms. Goal status: Met - assessed 08/18/2021  2. Patient will demonstrate independent use of home exercise program to facilitate ability to maintain/progress functional gains from skilled physical therapy services. Goal status: : Met - assessed 08/18/2021   3. Patient will demonstrate FOTO outcome > or = 59 % to indicate reduced disability due to condition. Goal status: :  MET    4.  Patient will demonstrate Lt [facility] joint mobility WFL to facilitate usual self care, dressing, reaching overhead at PLOF s limitation due to symptoms.  Goal status: :  Met - assessed 08/18/2021   5.  Patient will demonstrate Lt UE MMT 5/5 throughout to facilitate usual lifting, carrying in functional activity to PLOF s limitation.   Goal status: :  partially met - 08/18/2021   6.  Patient will demonstrate return to work at [employer] s limitation.   Goal status: : Mostly Met - assessed 08/18/2021        PLAN: PT FREQUENCY:  1x/week   PT DURATION: 6 weeks   PLANNED  INTERVENTIONS: Therapeutic exercises, Therapeutic activity, Neuro Muscular re-education, Balance training, Gait training, Patient/Family education, Joint mobilization, Stair training, DME instructions, Dry Needling, Electrical stimulation, Cryotherapy, Moist heat, Taping, Ultrasound, Ionotophoresis 56m/ml Dexamethasone, and Manual therapy.  All included unless contraindicated   PLAN FOR NEXT SESSION:  Trial HEP.  Discharge after 30 days inactivity.  Recert required most likely upon returning.   MScot Jun PT, DPT, OCS, ATC 08/18/21  9:19 AM  PHYSICAL THERAPY DISCHARGE SUMMARY  Visits from Start of Care: 23  Current functional level related to goals / functional outcomes: See note   Remaining deficits: See note   Education / Equipment: HEP  Patient goals were  mostly met . Patient is being discharged due to being pleased with the current functional level.  MScot Jun PT, DPT, OCS, ATC 09/21/21  3:15 PM

## 2021-09-14 ENCOUNTER — Other Ambulatory Visit: Payer: Self-pay | Admitting: Family Medicine

## 2021-10-28 ENCOUNTER — Other Ambulatory Visit: Payer: Self-pay | Admitting: Family Medicine

## 2021-11-16 ENCOUNTER — Ambulatory Visit (INDEPENDENT_AMBULATORY_CARE_PROVIDER_SITE_OTHER): Payer: No Typology Code available for payment source

## 2021-11-16 VITALS — Ht 66.0 in | Wt 141.0 lb

## 2021-11-16 DIAGNOSIS — Z0000 Encounter for general adult medical examination without abnormal findings: Secondary | ICD-10-CM

## 2021-11-16 NOTE — Progress Notes (Signed)
I connected with Dennis Villarreal today by telephone and verified that I am speaking with the correct person using two identifiers. Location patient: home Location provider: work Persons participating in the virtual visit: Benyamin, Jeff LPN.   I discussed the limitations, risks, security and privacy concerns of performing an evaluation and management service by telephone and the availability of in person appointments. I also discussed with the patient that there may be a patient responsible charge related to this service. The patient expressed understanding and verbally consented to this telephonic visit.    Interactive audio and video telecommunications were attempted between this provider and patient, however failed, due to patient having technical difficulties OR patient did not have access to video capability.  We continued and completed visit with audio only.     Vital signs may be patient reported or missing.  Subjective:   Dennis Villarreal is a 71 y.o. male who presents for Medicare Annual/Subsequent preventive examination.  Review of Systems     Cardiac Risk Factors include: advanced age (>15mn, >>24women);dyslipidemia;male gender     Objective:    Today's Vitals   11/16/21 1141  Weight: 141 lb (64 kg)  Height: '5\' 6"'$  (1.676 m)   Body mass index is 22.76 kg/m.     11/16/2021   11:52 AM 04/26/2021   10:57 AM 04/09/2021    3:21 PM 11/03/2020   11:59 AM 06/08/2020    1:10 PM 12/02/2019    9:18 AM  Advanced Directives  Does Patient Have a Medical Advance Directive? No No No No No No  Would patient like information on creating a medical advance directive?  No - Patient declined Yes (MAU/Ambulatory/Procedural Areas - Information given) Yes (MAU/Ambulatory/Procedural Areas - Information given) Yes (MAU/Ambulatory/Procedural Areas - Information given) No - Patient declined    Current Medications (verified) Outpatient Encounter Medications as of 11/16/2021   Medication Sig   atorvastatin (LIPITOR) 20 MG tablet TAKE ONE TABLET BY MOUTH DAILY   FLUoxetine (PROZAC) 40 MG capsule    gabapentin (NEURONTIN) 100 MG capsule TAKE ONE CAPSULE BY MOUTH EVERY EVENING FOR 3 DAYS THEN TITRATE UP TO TWO CAPSULES EVERY EVENING IF NEEDED   ibuprofen (ADVIL) 800 MG tablet Take 1 tablet (800 mg total) by mouth every 8 (eight) hours as needed.   Magnesium 250 MG TABS Take 250 mg by mouth in the morning.   Multiple Vitamin (MULTIVITAMIN WITH MINERALS) TABS tablet Take 1 tablet by mouth daily. Centrum for Men 50+   pantoprazole (PROTONIX) 40 MG tablet Take 1 tablet (40 mg total) by mouth daily. (Patient taking differently: Take 40 mg by mouth 2 (two) times daily.)   psyllium (METAMUCIL) 58.6 % powder Take 1 packet by mouth daily.   tadalafil (CIALIS) 5 MG tablet TAKE 1 TABLET BY MOUTH DAILY   tamsulosin (FLOMAX) 0.4 MG CAPS capsule TAKE 1 CAPSULE BY MOUTH DAILY   HYDROcodone-acetaminophen (NORCO/VICODIN) 5-325 MG tablet Take 1 tablet by mouth daily as needed for moderate pain.   QUEtiapine (SEROQUEL) 25 MG tablet Take 25 mg by mouth at bedtime. (Patient not taking: Reported on 11/16/2021)   No facility-administered encounter medications on file as of 11/16/2021.    Allergies (verified) Levofloxacin, Naproxen, and Oxycodone-acetaminophen   History: Past Medical History:  Diagnosis Date   ALLERGIC RHINITIS 09/04/2009   Basal cell carcinoma    [place]   DYSLIPIDEMIA 09/04/2009   GERD 09/04/2009   High cholesterol    just above borderline  per patient    Past Surgical History:  Procedure Laterality Date   GALLBLADDER SURGERY  01/2018   SHOULDER ARTHROSCOPY WITH OPEN ROTATOR CUFF REPAIR AND DISTAL CLAVICLE ACROMINECTOMY Left 04/13/2021   Procedure: LEFT SHOULDER ARTHROSCOPY, DEBRIDEMENT, MINI OPEN ROTATOR CUFF TEAR REPAIR, BICEPS TENODESIS;  Surgeon: Meredith Pel, MD;  Location: [facility];  Service: Orthopedics;  Laterality: Left;    SHOULDER SURGERY  1986, 2009   left shoulder scromoplasty   TONSILLECTOMY     TRIGGER FINGER RELEASE Left    Family History  Problem Relation Age of Onset   Heart disease Mother        CVA and cardiac arrhythmia, ? atrial fib   Stroke Mother    Hypertension Mother    Melanoma Father    Glaucoma Father    Glaucoma Brother    Social History   Socioeconomic History   Marital status: Married    Spouse name: Dennis Villarreal   Number of children: 3   Years of education: College   Highest education level: Not on file  Occupational History   Occupation: TEFL teacher      Comment: [employer]anner systems  Tobacco Use   Smoking status: Former    Packs/day: 1.00    Years: 20.00    Total pack years: 20.00    Types: Cigarettes    Quit date: 01/24/1982    Years since quitting: 39.8   Smokeless tobacco: Never  Vaping Use   Vaping Use: Never used  Substance and Sexual Activity   Alcohol use: Yes    Alcohol/week: 0.0 standard drinks of alcohol    Comment: socially   Drug use: No   Sexual activity: Not Currently  Other Topics Concern   Not on file  Social History Narrative   Lives at home with wife and youngest daughter.   Has 3 children.    Caffeine: 2 cups/day    Social Determinants of Health   Financial Resource Strain: Low Risk  (11/16/2021)   Overall Financial Resource Strain (CARDIA)    Difficulty of Paying Living Expenses: Not hard at all  Food Insecurity: No Food Insecurity (11/16/2021)   Hunger Vital Sign    Worried About Running Out of Food in the Last Year: Never true    Ran Out of Food in the Last Year: Never true  Transportation Needs: No Transportation Needs (11/16/2021)   PRAPARE - Hydrologist (Medical): No    Lack of Transportation (Non-Medical): No  Physical Activity: Inactive (11/16/2021)   Exercise Vital Sign    Days of Exercise per Week: 0 days    Minutes of Exercise per Session: 0 min  Stress: Stress Concern Present  (11/16/2021)   [name]    Feeling of Stress : Rather much  Social Connections: Moderately Integrated (11/03/2020)   Social Connection and Isolation Panel [NHANES]    Frequency of Communication with Friends and Family: More than three times a week    Frequency of Social Gatherings with Friends and Family: More than three times a week    Attends Religious Services: Never    Marine scientist or Organizations: Yes    Attends Archivist Meetings: 1 to 4 times per year    Marital Status: Married    Tobacco Counseling Counseling given: Not Answered   Clinical Intake:  Pre-visit preparation completed: Yes  Pain : No/denies pain     Nutritional Status: BMI of  19-24  Normal Nutritional Risks: Nausea/ vomitting/ diarrhea (nausea last couple of days) Diabetes: No  How often do you need to have someone help you when you read instructions, pamphlets, or other written materials from your doctor or pharmacy?: 1 - Never What is the last grade level you completed in school?: 63yr college  Diabetic? no  Interpreter Needed?: No  Information entered by :: NAllen LPN   Activities of Daily Living    11/16/2021   11:55 AM 04/09/2021    3:27 PM  In your present state of health, do you have any difficulty performing the following activities:  Hearing? 1   Vision? 0   Difficulty concentrating or making decisions? 0   Walking or climbing stairs? 0   Dressing or bathing? 0   Doing errands, shopping? 0 0  Preparing Food and eating ? N   Using the Toilet? N   In the past six months, have you accidently leaked urine? N   Do you have problems with loss of bowel control? N   Managing your Medications? N   Managing your Finances? N   Housekeeping or managing your Housekeeping? N     Patient Care Team: BEulas Post MD as PCP - General  Indicate any recent Medical Services you may have received from  other than [facility] providers in the past year (date may be approximate).     Assessment:   This is a routine wellness examination for Les.  Hearing/Vision screen Vision Screening - Comments:: Regular eye exams,   Dietary issues and exercise activities discussed: Current Exercise Habits: The patient does not participate in regular exercise at present   Goals Addressed             This Visit's Progress    Patient Stated       11/16/2021, no goals       Depression Screen    11/16/2021   11:53 AM 06/30/2021    7:17 AM 06/23/2021    2:56 PM 02/26/2021    7:24 AM 11/03/2020   11:58 AM 12/23/2019   11:24 AM 12/23/2019   10:40 AM  PHQ 2/9 Scores  PHQ - 2 Score 2 4 0 1 0 0 0  PHQ- 9 Score '6 16 5 7  5     '$ Fall Risk    11/16/2021   11:53 AM 11/15/2021   12:48 PM 06/23/2021    2:56 PM 02/26/2021    7:24 AM 11/03/2020   12:00 PM  [place] the past year? 0 0 0 0 0  Number falls in past yr: 0  0  0  Injury with Fall? 0  0  0  Risk for fall due to : Medication side effect  No Fall Risks  Impaired vision  Follow up Falls prevention discussed;Education provided;Falls evaluation completed  Falls evaluation completed  Falls prevention discussed    FALL RISK PREVENTION PERTAINING TO THE HOME:  Any stairs in or around the home? Yes  If so, are there any without handrails? No  Home free of loose throw rugs in walkways, pet beds, electrical cords, etc? Yes  Adequate lighting in your home to reduce risk of falls? Yes   ASSISTIVE DEVICES UTILIZED TO PREVENT FALLS:  Life alert? No  Use of a cane, walker or w/c? No  Grab bars in the bathroom? Yes  Shower chair or bench in shower? No  Elevated toilet seat or a handicapped toilet? No   TIMED UP  AND GO:  Was the test performed? No .      Cognitive Function:        11/16/2021   11:56 AM 11/03/2020   12:02 PM  6CIT Screen  What Year? 0 points 0 points  What month? 0 points 0 points  What time? 0 points 0  points  Count back from 20 0 points 0 points  Months in reverse 0 points 0 points  Repeat phrase 0 points 0 points  Total Score 0 points 0 points    Immunizations Immunization History  Administered Date(s) Administered   Influenza Split 11/03/2012   Influenza, High Dose Seasonal PF 12/07/2016, 12/12/2017, 10/25/2018, 11/04/2019, 11/03/2020   Influenza-Unspecified 10/29/2013, 10/25/2015, 11/24/2016   PFIZER(Purple Top)SARS-COV-2 Vaccination 03/02/2019, 03/23/2019, 10/29/2019   Pfizer Covid-19 Vaccine Bivalent Booster 74yr & up 12/07/2020   Pneumococcal Conjugate-13 01/16/2017   Pneumococcal Polysaccharide-23 01/05/2021   Tdap 04/02/2014   Zoster Recombinat (Shingrix) 06/30/2017, 12/22/2017    TDAP status: Up to date  Flu Vaccine status: Due, Education has been provided regarding the importance of this vaccine. Advised may receive this vaccine at local pharmacy or Health Dept. Aware to provide a copy of the vaccination record if obtained from local pharmacy or Health Dept. Verbalized acceptance and understanding.  Pneumococcal vaccine status: Up to date  Covid-19 vaccine status: Completed vaccines  Qualifies for Shingles Vaccine? Yes   Zostavax completed Yes   Shingrix Completed?: Yes  Screening Tests Health Maintenance  Topic Date Due   COVID-19 Vaccine (5 - Pfizer series) 04/06/2021   INFLUENZA VACCINE  08/24/2021   TETANUS/TDAP  04/01/2024   COLONOSCOPY (Pts 45-438yrInsurance coverage will need to be confirmed)  08/23/2028   Pneumonia Vaccine 6580Years old  Completed   Hepatitis C Screening  Completed   Zoster Vaccines- Shingrix  Completed   HPV VACCINES  Aged Out    Health Maintenance  Health Maintenance Due  Topic Date Due   COVID-19 Vaccine (5 - Pfizer series) 04/06/2021   INFLUENZA VACCINE  08/24/2021    Colorectal cancer screening: scheduled for 11/29/2021  Lung Cancer Screening: (Low Dose CT Chest recommended if Age 71-80ears, 30 pack-year currently  smoking OR have quit w/in 15years.) does not qualify.   Lung Cancer Screening Referral: no  Additional Screening:  Hepatitis C Screening: does qualify; Completed 01/16/2017  Vision Screening: Recommended annual ophthalmology exams for early detection of glaucoma and other disorders of the eye. Is the patient up to date with their annual eye exam?  Yes  Who is the provider or what is the name of the office in which the patient attends annual eye exams? none If pt is not established with a provider, would they like to be referred to a provider to establish care? No .   Dental Screening: Recommended annual dental exams for proper oral hygiene  Community Resource Referral / Chronic Care Management: CRR required this visit?  No   CCM required this visit?  No      Plan:     I have personally reviewed and noted the following in the patient's chart:   Medical and social history Use of alcohol, tobacco or illicit drugs  Current medications and supplements including opioid prescriptions. Patient is not currently taking opioid prescriptions. Functional ability and status Nutritional status Physical activity Advanced directives List of other physicians Hospitalizations, surgeries, and ER visits in previous 12 months Vitals Screenings to include cognitive, depression, and falls Referrals and appointments  In addition, I have reviewed and  discussed with patient certain preventive protocols, quality metrics, and best practice recommendations. A written personalized care plan for preventive services as well as general preventive health recommendations were provided to patient.     Kellie Simmering, LPN   [id number]/02/[id number]   Nurse Notes: has some issues he would like to discuss with Dr. Elease Hashimoto. Appointment made for 11/22/2021  Due to this being a virtual visit, the after visit summary with patients personalized plan was offered to patient via mail or my-chart.  Patient would like to access  on my-chart

## 2021-11-16 NOTE — Patient Instructions (Signed)
[name] , Thank you for taking time to come for your Medicare Wellness Visit. I appreciate your ongoing commitment to your health goals. Please review the following plan we discussed and let me know if I can assist you in the future.   Screening recommendations/referrals: Colonoscopy: scheduled for 11/29/2021 Recommended yearly ophthalmology/optometry visit for glaucoma screening and checkup Recommended yearly dental visit for hygiene and checkup  Vaccinations: Influenza vaccine: due Pneumococcal vaccine: completed 01/05/2021 Tdap vaccine: completed 04/02/2014, due 04/01/2024 Shingles vaccine: completed   Covid-19:  12/07/2020, 10/29/2019, 03/23/2019, 03/02/2019  Advanced directives: Advance directive discussed with you today.   Conditions/risks identified: none  Next appointment: Follow up in one year for your annual wellness visit.   Preventive Care 71 Years and Older, Male Preventive care refers to lifestyle choices and visits with your health care provider that can promote health and wellness. What does preventive care include? A yearly physical exam. This is also called an annual well check. Dental exams once or twice a year. Routine eye exams. Ask your health care provider how often you should have your eyes checked. Personal lifestyle choices, including: Daily care of your teeth and gums. Regular physical activity. Eating a healthy diet. Avoiding tobacco and drug use. Limiting alcohol use. Practicing safe sex. Taking low doses of aspirin every day. Taking vitamin and mineral supplements as recommended by your health care provider. What happens during an annual well check? The services and screenings done by your health care provider during your annual well check will depend on your age, overall health, lifestyle risk factors, and family history of disease. Counseling  Your health care provider may ask you questions about your: Alcohol use. Tobacco use. Drug use. Emotional  well-being. Home and relationship well-being. Sexual activity. Eating habits. History of falls. Memory and ability to understand (cognition). Work and work Statistician. Screening  You may have the following tests or measurements: Height, weight, and BMI. Blood pressure. Lipid and cholesterol levels. These may be checked every 5 years, or more frequently if you are over 50 years old. Skin check. Lung cancer screening. You may have this screening every year starting at age 33 if you have a 30-pack-year history of smoking and currently smoke or have quit within the past 15 years. Fecal occult blood test (FOBT) of the stool. You may have this test every year starting at age 52. Flexible sigmoidoscopy or colonoscopy. You may have a sigmoidoscopy every 5 years or a colonoscopy every 10 years starting at age 34. Prostate cancer screening. Recommendations will vary depending on your family history and other risks. Hepatitis C blood test. Hepatitis B blood test. Sexually transmitted disease (STD) testing. Diabetes screening. This is done by checking your blood sugar (glucose) after you have not eaten for a while (fasting). You may have this done every 1-3 years. Abdominal aortic aneurysm (AAA) screening. You may need this if you are a current or former smoker. Osteoporosis. You may be screened starting at age 73 if you are at high risk. Talk with your health care provider about your test results, treatment options, and if necessary, the need for more tests. Vaccines  Your health care provider may recommend certain vaccines, such as: Influenza vaccine. This is recommended every year. Tetanus, diphtheria, and acellular pertussis (Tdap, Td) vaccine. You may need a Td booster every 10 years. Zoster vaccine. You may need this after age 67. Pneumococcal 13-valent conjugate (PCV13) vaccine. One dose is recommended after age 26. Pneumococcal polysaccharide (PPSV23) vaccine. One dose is  recommended after  age 9. Talk to your health care provider about which screenings and vaccines you need and how often you need them. This information is not intended to replace advice given to you by your health care provider. Make sure you discuss any questions you have with your health care provider. Document Released: 02/06/2015 Document Revised: 09/30/2015 Document Reviewed: 11/11/2014 Elsevier Interactive Patient Education  2017 [place] Prevention in the Home Falls can cause injuries. They can happen to people of all ages. There are many things you can do to make your home safe and to help prevent falls. What can I do on the outside of my home? Regularly fix the edges of walkways and driveways and fix any cracks. Remove anything that might make you trip as you walk through a door, such as a raised step or threshold. Trim any bushes or trees on the path to your home. Use bright outdoor lighting. Clear any walking paths of anything that might make someone trip, such as rocks or tools. Regularly check to see if handrails are loose or broken. Make sure that both sides of any steps have handrails. Any raised decks and porches should have guardrails on the edges. Have any leaves, snow, or ice cleared regularly. Use sand or salt on walking paths during winter. Clean up any spills in your garage right away. This includes oil or grease spills. What can I do in the bathroom? Use night lights. Install grab bars by the toilet and in the tub and shower. Do not use towel bars as grab bars. Use non-skid mats or decals in the tub or shower. If you need to sit down in the shower, use a plastic, non-slip stool. Keep the floor dry. Clean up any water that spills on the floor as soon as it happens. Remove soap buildup in the tub or shower regularly. Attach bath mats securely with double-sided non-slip rug tape. Do not have throw rugs and other things on the floor that can make you trip. What can I do in the  bedroom? Use night lights. Make sure that you have a light by your bed that is easy to reach. Do not use any sheets or blankets that are too big for your bed. They should not hang down onto the floor. Have a firm chair that has side arms. You can use this for support while you get dressed. Do not have throw rugs and other things on the floor that can make you trip. What can I do in the kitchen? Clean up any spills right away. Avoid walking on wet floors. Keep items that you use a lot in easy-to-reach places. If you need to reach something above you, use a strong step stool that has a grab bar. Keep electrical cords out of the way. Do not use floor polish or wax that makes floors slippery. If you must use wax, use non-skid floor wax. Do not have throw rugs and other things on the floor that can make you trip. What can I do with my stairs? Do not leave any items on the stairs. Make sure that there are handrails on both sides of the stairs and use them. Fix handrails that are broken or loose. Make sure that handrails are as long as the stairways. Check any carpeting to make sure that it is firmly attached to the stairs. Fix any carpet that is loose or worn. Avoid having throw rugs at the top or bottom of the stairs. If  you do have throw rugs, attach them to the floor with carpet tape. Make sure that you have a light switch at the top of the stairs and the bottom of the stairs. If you do not have them, ask someone to add them for you. What else can I do to help prevent falls? Wear shoes that: Do not have high heels. Have rubber bottoms. Are comfortable and fit you well. Are closed at the toe. Do not wear sandals. If you use a stepladder: Make sure that it is fully opened. Do not climb a closed stepladder. Make sure that both sides of the stepladder are locked into place. Ask someone to hold it for you, if possible. Clearly mark and make sure that you can see: Any grab bars or  handrails. First and last steps. Where the edge of each step is. Use tools that help you move around (mobility aids) if they are needed. These include: Canes. Walkers. Scooters. Crutches. Turn on the lights when you go into a dark area. Replace any light bulbs as soon as they burn out. Set up your furniture so you have a clear path. Avoid moving your furniture around. If any of your floors are uneven, fix them. If there are any pets around you, be aware of where they are. Review your medicines with your doctor. Some medicines can make you feel dizzy. This can increase your chance of falling. Ask your doctor what other things that you can do to help prevent falls. This information is not intended to replace advice given to you by your health care provider. Make sure you discuss any questions you have with your health care provider. Document Released: 11/06/2008 Document Revised: 06/18/2015 Document Reviewed: 02/14/2014 Elsevier Interactive Patient Education  2017 [employer].

## 2021-11-22 ENCOUNTER — Ambulatory Visit: Payer: No Typology Code available for payment source | Admitting: Family Medicine

## 2021-11-22 ENCOUNTER — Ambulatory Visit (INDEPENDENT_AMBULATORY_CARE_PROVIDER_SITE_OTHER): Payer: No Typology Code available for payment source | Admitting: Family Medicine

## 2021-11-22 ENCOUNTER — Encounter: Payer: Self-pay | Admitting: Family Medicine

## 2021-11-22 VITALS — BP 140/70 | HR 58 | Temp 98.7°F | Ht 66.0 in | Wt 148.6 lb

## 2021-11-22 DIAGNOSIS — R110 Nausea: Secondary | ICD-10-CM | POA: Diagnosis not present

## 2021-11-22 DIAGNOSIS — L538 Other specified erythematous conditions: Secondary | ICD-10-CM | POA: Diagnosis not present

## 2021-11-22 DIAGNOSIS — L298 Other pruritus: Secondary | ICD-10-CM | POA: Diagnosis not present

## 2021-11-22 DIAGNOSIS — L728 Other follicular cysts of the skin and subcutaneous tissue: Secondary | ICD-10-CM | POA: Diagnosis not present

## 2021-11-22 DIAGNOSIS — R1013 Epigastric pain: Secondary | ICD-10-CM

## 2021-11-22 DIAGNOSIS — Z23 Encounter for immunization: Secondary | ICD-10-CM | POA: Diagnosis not present

## 2021-11-22 DIAGNOSIS — R051 Acute cough: Secondary | ICD-10-CM

## 2021-11-22 DIAGNOSIS — R253 Fasciculation: Secondary | ICD-10-CM | POA: Diagnosis not present

## 2021-11-22 DIAGNOSIS — H9193 Unspecified hearing loss, bilateral: Secondary | ICD-10-CM | POA: Diagnosis not present

## 2021-11-22 LAB — COMPREHENSIVE METABOLIC PANEL
ALT: 13 U/L (ref 0–53)
AST: 19 U/L (ref 0–37)
Albumin: 4.5 g/dL (ref 3.5–5.2)
Alkaline Phosphatase: 60 U/L (ref 39–117)
BUN: 15 mg/dL (ref 6–23)
CO2: 29 mEq/L (ref 19–32)
Calcium: 9.3 mg/dL (ref 8.4–10.5)
Chloride: 102 mEq/L (ref 96–112)
Creatinine, Ser: 0.75 mg/dL (ref 0.40–1.50)
GFR: 91.01 mL/min (ref >60.00)
Glucose, Bld: 84 mg/dL (ref 70–99)
Potassium: 4.5 mEq/L (ref 3.5–5.1)
Sodium: 137 mEq/L (ref 135–145)
Total Bilirubin: 0.9 mg/dL (ref 0.2–1.2)
Total Protein: 7.2 g/dL (ref 6.0–8.3)

## 2021-11-22 LAB — CBC WITH DIFFERENTIAL/PLATELET
Basophils Absolute: 0 10*3/uL (ref 0.0–0.1)
Basophils Relative: 0.2 % (ref 0.0–3.0)
Eosinophils Absolute: 0.1 10*3/uL (ref 0.0–0.7)
Eosinophils Relative: 2.3 % (ref 0.0–5.0)
HCT: 41.3 % (ref 39.0–52.0)
Hemoglobin: 14.1 g/dL (ref 13.0–17.0)
Lymphocytes Relative: 29.7 % (ref 12.0–46.0)
Lymphs Abs: 1.9 10*3/uL (ref 0.7–4.0)
MCHC: 34.1 g/dL (ref 30.0–36.0)
MCV: 94.9 fl (ref 78.0–100.0)
Monocytes Absolute: 0.6 10*3/uL (ref 0.1–1.0)
Monocytes Relative: 9 % (ref 3.0–12.0)
Neutro Abs: 3.8 10*3/uL (ref 1.4–7.7)
Neutrophils Relative %: 58.8 % (ref 43.0–77.0)
Platelets: 167 10*3/uL (ref 150.0–400.0)
RBC: 4.35 Mil/uL (ref 4.22–5.81)
RDW: 12.8 % (ref 11.5–15.5)
WBC: 6.4 10*3/uL (ref 4.0–10.5)

## 2021-11-22 NOTE — Patient Instructions (Signed)
Follow up with audiologist regarding the hearing  Suspect benign muscle fasciculation of chest  We will call after labs are back.   Consider plain Mucinex 1,200 mg twice daily as needed for chest congestion.

## 2021-11-22 NOTE — Progress Notes (Signed)
Established Patient Office Visit  Subjective   Patient ID: Dennis Villarreal, male    DOB: 12/11/1950  Age: 71 y.o. MRN: [id number]  Chief Complaint  Patient presents with   Nausea    X1 week, denies vomiting   Abdominal Pain    X1 week, noticed after eating a spicy dish which often does not bother him    Chest congestion x1 week   "Fluterin"g noted left pectoral x1 week   Hearing Problem    Patient complains of hearing loss and states he is concerned with difficulty understanding words versus hearing instead x3 months   Mental Health Problem    Patient states he is seeing a therapist due to mental health concerns, problems with his marriage and would like to discuss this today    HPI   Patient is here with multiple complaints and issues as follows  He relates 1 week history of nausea without vomiting.  Had only 1 episode of brief epigastric pain after eating some spicy.  He does have a history of GERD and takes pantoprazole.  No melena.  No hematemesis.  No abdominal pain at this time.  Nausea somewhat intermittent relatively mild.  He had previous cholecystectomy.  Chest congestion for 1 week.  Mostly nonproductive cough.  No fever.  No dyspnea.  No significant nasal congestion.  Bilateral hearing loss.  He has bilateral hearing aids.  He had some progressive hearing loss recently.  Has not seen audiologist in some time.  Plans to get back soon.  He relates approxi-1 week history of "fluttering "sensation left pectoral region.  He states is somewhat of a vibratory sensation.  No pain.  No palpitations.  No chest pain.  No dyspnea.  Past Medical History:  Diagnosis Date   ALLERGIC RHINITIS 09/04/2009   Basal cell carcinoma    Cancer ([place]) 2012   melanoma   DYSLIPIDEMIA 09/04/2009   GERD 09/04/2009   High cholesterol    just above borderline per patient    Past Surgical History:  Procedure Laterality Date   GALLBLADDER SURGERY  01/2018   SHOULDER ARTHROSCOPY WITH OPEN  ROTATOR CUFF REPAIR AND DISTAL CLAVICLE ACROMINECTOMY Left 04/13/2021   Procedure: LEFT SHOULDER ARTHROSCOPY, DEBRIDEMENT, MINI OPEN ROTATOR CUFF TEAR REPAIR, BICEPS TENODESIS;  Surgeon: Meredith Pel, MD;  Location: [facility];  Service: Orthopedics;  Laterality: Left;   SHOULDER SURGERY  1986, 2009   left shoulder scromoplasty   TONSILLECTOMY     TRIGGER FINGER RELEASE Left     reports that he quit smoking about 39 years ago. His smoking use included cigarettes. He has a 20.00 pack-year smoking history. He has never used smokeless tobacco. He reports current alcohol use. He reports that he does not use drugs. family history includes Glaucoma in his brother and father; Heart disease in his mother; Hypertension in his mother; Melanoma in his father; Stroke in his mother. Allergies  Allergen Reactions   Levofloxacin     hives and GI upset   Naproxen Hives, Swelling and Other (See Comments)    Can take ibuprofen   Oxycodone-Acetaminophen     Upset stomach    Review of Systems  Constitutional:  Negative for chills, fever and weight loss.  HENT:  Positive for hearing loss.   Respiratory:  Positive for cough. Negative for hemoptysis and shortness of breath.   Gastrointestinal:  Positive for nausea. Negative for blood in stool, constipation, diarrhea, heartburn, melena and vomiting.  Genitourinary:  Negative for dysuria.  Objective:     BP (!) 140/70 (BP Location: Left Arm, Patient Position: Sitting, Cuff Size: Normal)   Pulse (!) 58   Temp 98.7 F (37.1 C) (Oral)   Ht '5\' 6"'$  (1.676 m)   Wt 148 lb 9.6 oz (67.4 kg)   SpO2 98%   BMI 23.98 kg/m  BP Readings from Last 3 Encounters:  11/22/21 (!) 140/70  06/30/21 140/78  06/23/21 108/80   Wt Readings from Last 3 Encounters:  11/22/21 148 lb 9.6 oz (67.4 kg)  11/16/21 141 lb (64 kg)  06/30/21 149 lb 3.2 oz (67.7 kg)      Physical Exam Vitals reviewed.  Constitutional:      Appearance: He is well-developed.   Cardiovascular:     Rate and Rhythm: Normal rate and regular rhythm.  Pulmonary:     Effort: Pulmonary effort is normal.     Breath sounds: Normal breath sounds. No wheezing or rales.  Abdominal:     Comments: Nondistended.  Normal bowel sounds.  Soft and nontender.  No hepatomegaly or splenomegaly.  Neurological:     Mental Status: He is alert.      No results found for any visits on 11/22/21.    The 10-year ASCVD risk score (Arnett DK, et al., 2019) is: 19.6%    Assessment & Plan:   Problem List Items Addressed This Visit   None Visit Diagnoses     Nausea    -  Primary   Relevant Orders   CMP   Abdominal pain, epigastric       Relevant Orders   CBC with Differential/Platelet   Need for immunization against influenza       Relevant Orders   Flu Vaccine QUAD High Dose(Fluad) (Completed)   Muscle fasciculation       Acute cough       Bilateral hearing loss, unspecified hearing loss type         Patient has multiple issues as above. -Suspect probably benign fasciculations left pectoral muscle.  No concerning symptoms.  Cardiac exam unremarkable  -Cough with no red flags such as fever or dyspnea.  Lung exam unremarkable.  Probable acute viral bronchitis.  Consider over-the-counter Mucinex 1200 mg twice daily follow-up immediately for any fever or other concerns  -Bilateral hearing loss.  He has bilateral hearing aids.  No cerumen on exam.  Recommend follow-up with audiologist  -Nausea without vomiting.  Nonfocal abdominal exam.  Check labs with CBC and CMP  No follow-ups on file.    Carolann Littler, MD

## 2021-11-22 NOTE — Progress Notes (Signed)
naus

## 2021-11-29 DIAGNOSIS — Z86010 Personal history of colonic polyps: Secondary | ICD-10-CM | POA: Diagnosis not present

## 2021-11-29 DIAGNOSIS — Z09 Encounter for follow-up examination after completed treatment for conditions other than malignant neoplasm: Secondary | ICD-10-CM | POA: Diagnosis not present

## 2021-11-29 DIAGNOSIS — K5730 Diverticulosis of large intestine without perforation or abscess without bleeding: Secondary | ICD-10-CM | POA: Diagnosis not present

## 2021-11-29 DIAGNOSIS — K648 Other hemorrhoids: Secondary | ICD-10-CM | POA: Diagnosis not present

## 2021-11-29 DIAGNOSIS — D123 Benign neoplasm of transverse colon: Secondary | ICD-10-CM | POA: Diagnosis not present

## 2021-11-29 LAB — HM COLONOSCOPY

## 2021-12-01 DIAGNOSIS — D123 Benign neoplasm of transverse colon: Secondary | ICD-10-CM | POA: Diagnosis not present

## 2021-12-13 DIAGNOSIS — E785 Hyperlipidemia, unspecified: Secondary | ICD-10-CM | POA: Diagnosis not present

## 2021-12-13 DIAGNOSIS — Z6822 Body mass index (BMI) 22.0-22.9, adult: Secondary | ICD-10-CM | POA: Diagnosis not present

## 2021-12-13 DIAGNOSIS — R2681 Unsteadiness on feet: Secondary | ICD-10-CM | POA: Diagnosis not present

## 2021-12-13 DIAGNOSIS — Z86010 Personal history of colonic polyps: Secondary | ICD-10-CM | POA: Diagnosis not present

## 2021-12-13 DIAGNOSIS — Z008 Encounter for other general examination: Secondary | ICD-10-CM | POA: Diagnosis not present

## 2021-12-13 DIAGNOSIS — F324 Major depressive disorder, single episode, in partial remission: Secondary | ICD-10-CM | POA: Diagnosis not present

## 2021-12-13 DIAGNOSIS — K219 Gastro-esophageal reflux disease without esophagitis: Secondary | ICD-10-CM | POA: Diagnosis not present

## 2021-12-13 DIAGNOSIS — N400 Enlarged prostate without lower urinary tract symptoms: Secondary | ICD-10-CM | POA: Diagnosis not present

## 2021-12-13 DIAGNOSIS — F17211 Nicotine dependence, cigarettes, in remission: Secondary | ICD-10-CM | POA: Diagnosis not present

## 2021-12-22 ENCOUNTER — Encounter: Payer: Self-pay | Admitting: Family Medicine

## 2021-12-24 ENCOUNTER — Telehealth: Payer: Self-pay | Admitting: Family Medicine

## 2021-12-24 MED ORDER — FLUOXETINE HCL 40 MG PO CAPS: 40.0000 mg | ORAL_CAPSULE | Freq: Every day | ORAL | 0 refills | Status: DC

## 2021-12-24 NOTE — Telephone Encounter (Signed)
Patient requesting a refill of FLUoxetine (PROZAC) 40 MG capsule . Was prescribed by another provider. Offered OV, patient wants to see if provider will prescribe

## 2021-12-24 NOTE — Telephone Encounter (Signed)
I spoke with the patient and he stated he would like for PCP to take over rx until he can see another therapist. Patient states he prefers Fluoxetine 40 mg instead of the 60 he was originally prescribed. I contacted the patients pharmacy and was informed he takes 1 tablet daily of Fluoxetine. This has been sent to pharmacy and patient is aware.

## 2022-01-15 ENCOUNTER — Other Ambulatory Visit: Payer: Self-pay | Admitting: Family Medicine

## 2022-01-17 NOTE — Telephone Encounter (Signed)
Last CPE 01/05/21  Last OV 11/22/21 for acute reasons.

## 2022-02-01 ENCOUNTER — Other Ambulatory Visit: Payer: Self-pay | Admitting: Family Medicine

## 2022-02-02 ENCOUNTER — Encounter: Payer: Self-pay | Admitting: Family Medicine

## 2022-02-02 ENCOUNTER — Ambulatory Visit (INDEPENDENT_AMBULATORY_CARE_PROVIDER_SITE_OTHER): Payer: No Typology Code available for payment source | Admitting: Family Medicine

## 2022-02-02 VITALS — BP 142/80 | HR 65 | Temp 98.0°F | Ht 66.0 in | Wt 149.7 lb

## 2022-02-02 DIAGNOSIS — M25561 Pain in right knee: Secondary | ICD-10-CM | POA: Diagnosis not present

## 2022-02-02 DIAGNOSIS — K4090 Unilateral inguinal hernia, without obstruction or gangrene, not specified as recurrent: Secondary | ICD-10-CM | POA: Diagnosis not present

## 2022-02-02 NOTE — Patient Instructions (Signed)
?  left medial meniscus injury.   Hopefully this will heal with time.   Consider topical Voltaren gel 2- 4 times daily.   Let me know if you would like to see surgeon regarding the inguinal hernia.

## 2022-02-02 NOTE — Progress Notes (Signed)
Established Patient Office Visit  Subjective   Patient ID: Dennis Villarreal, male    DOB: [birth year]  Age: 72 y.o. MRN: [id number]  Chief Complaint  Patient presents with   Hernia    Patient complains of hernia, x1 week   Knee Injury    Patient complains of right knee injury, x2 weeks, Patient reports he fell of fence when injury occurred, Tried Ibuprofen with little relief     HPI   Mr. Hern is seen for the following issues  Left inguinal bulge.  Just noticed about a week ago.  No injury.  No associated pain.  No prior history of hernia.  Right knee pain.  He works at the [employer].  Around 19th or 20 December he had to go over a 40 some inch chain-link fence and his pant leg got caught and he states he twisted his right knee going over the fence.  Has had some right medial knee pain and mild swelling since then.  No sense of instability.  Not taking anything regularly for this.  No locking or giving way.  Past Medical History:  Diagnosis Date   ALLERGIC RHINITIS 09/04/2009   Basal cell carcinoma    Cancer ([place]) 2012   melanoma   DYSLIPIDEMIA 09/04/2009   GERD 09/04/2009   High cholesterol    just above borderline per patient    Past Surgical History:  Procedure Laterality Date   GALLBLADDER SURGERY  01/2018   SHOULDER ARTHROSCOPY WITH OPEN ROTATOR CUFF REPAIR AND DISTAL CLAVICLE ACROMINECTOMY Left 04/13/2021   Procedure: LEFT SHOULDER ARTHROSCOPY, DEBRIDEMENT, MINI OPEN ROTATOR CUFF TEAR REPAIR, BICEPS TENODESIS;  Surgeon: Meredith Pel, MD;  Location: [facility];  Service: Orthopedics;  Laterality: Left;   SHOULDER SURGERY  1986, 2009   left shoulder scromoplasty   TONSILLECTOMY     TRIGGER FINGER RELEASE Left     reports that he quit smoking about 40 years ago. His smoking use included cigarettes. He has a 20.00 pack-year smoking history. He has never used smokeless tobacco. He reports current alcohol use. He reports that he does not use  drugs. family history includes Glaucoma in his brother and father; Heart disease in his mother; Hypertension in his mother; Melanoma in his father; Stroke in his mother. Allergies  Allergen Reactions   Levofloxacin     hives and GI upset   Naproxen Hives, Swelling and Other (See Comments)    Can take ibuprofen   Oxycodone-Acetaminophen     Upset stomach    Review of Systems  Gastrointestinal:  Negative for abdominal pain, blood in stool and constipation.  Neurological:  Negative for tingling and weakness.      Objective:     BP (!) 142/80 (BP Location: Left Arm, Patient Position: Sitting, Cuff Size: Normal)   Pulse 65   Temp 98 F (36.7 C) (Oral)   Ht '5\' 6"'$  (1.676 m)   Wt 149 lb 11.2 oz (67.9 kg)   SpO2 100%   BMI 24.16 kg/m    Physical Exam Vitals reviewed.  Constitutional:      Appearance: Normal appearance.  Cardiovascular:     Rate and Rhythm: Normal rate and regular rhythm.  Genitourinary:    Comments: Small left inguinal hernia which is soft and easily reducible. Musculoskeletal:     Comments: Right knee reveals no effusion.  No ecchymosis.  No erythema.  No warmth.  Good range of motion.  Mild medial joint line tenderness.  Collateral ligament  testing is normal.  Cruciate ligament testing normal.  Neurological:     Mental Status: He is alert.      No results found for any visits on 02/02/22.    The 10-year ASCVD risk score (Arnett DK, et al., 2019) is: 20%    Assessment & Plan:   #1 left inguinal hernia.  This is small and mostly asymptomatic currently.  We discussed referral to general surgeon but he declines at this time.  He will let us know if this is becoming more painful or if he decides to pursue surgical intervention  #2 right knee pain.  Question small medial meniscus injury.  May also have medial collateral ligament strain.  No instability.  Recommend elastic knee sleeve.  Recommend topical Voltaren gel 2-4 times daily.  Avoid squatting is  much as possible.  Give this another few weeks.  If not improving at that point be in touch  Carolann Littler, MD

## 2022-02-03 ENCOUNTER — Telehealth: Payer: Self-pay | Admitting: Family Medicine

## 2022-02-03 DIAGNOSIS — K4090 Unilateral inguinal hernia, without obstruction or gangrene, not specified as recurrent: Secondary | ICD-10-CM

## 2022-02-03 NOTE — Telephone Encounter (Signed)
Patient wants to go forward with the referral to a provider for his hernia.

## 2022-02-04 NOTE — Telephone Encounter (Signed)
Left a message for the pt to return my call. Referral placed

## 2022-02-04 NOTE — Addendum Note (Signed)
Addended by: Nilda Riggs on: 02/04/2022 07:38 AM   Modules accepted: Orders

## 2022-02-04 NOTE — Telephone Encounter (Signed)
Patient informed that referral has been placed

## 2022-02-17 ENCOUNTER — Other Ambulatory Visit: Payer: Self-pay | Admitting: Family Medicine

## 2022-02-21 DIAGNOSIS — K4090 Unilateral inguinal hernia, without obstruction or gangrene, not specified as recurrent: Secondary | ICD-10-CM | POA: Diagnosis not present

## 2022-02-23 DIAGNOSIS — H903 Sensorineural hearing loss, bilateral: Secondary | ICD-10-CM | POA: Diagnosis not present

## 2022-02-25 ENCOUNTER — Telehealth: Payer: Self-pay | Admitting: Family Medicine

## 2022-02-25 NOTE — Telephone Encounter (Signed)
Pt is asking for a behavioral health referral, as soon as possible. Please call Pt back to discuss.  LOV:  02/02/22

## 2022-02-28 NOTE — Telephone Encounter (Signed)
Patient provided number to Behavior health

## 2022-02-28 NOTE — Telephone Encounter (Signed)
I spoke with the patient and he reported him and his wife have been having marital issues and stated that he is looking for a therapist to help walk him through his current situation.

## 2022-03-23 ENCOUNTER — Telehealth: Payer: Self-pay | Admitting: Family Medicine

## 2022-03-23 DIAGNOSIS — M25561 Pain in right knee: Secondary | ICD-10-CM

## 2022-03-23 NOTE — Telephone Encounter (Signed)
Patient seen 02/02/22 for twisted knee, says symptoms are not improving, requesting a referral to specialist

## 2022-03-24 NOTE — Addendum Note (Signed)
Addended by: Nilda Riggs on: 03/24/2022 04:42 PM   Modules accepted: Orders

## 2022-03-24 NOTE — Telephone Encounter (Signed)
Left a message on patient's vm informing him that referral was placed

## 2022-03-28 NOTE — Progress Notes (Unsigned)
I, Dennis Villarreal, LAT, ATC acting as a scribe for Dennis Leader, MD.  Dennis Villarreal is a 72 y.o. male who presents to [facility] today for R knee pain. Pt was previously seen by Dr. Georgina Villarreal on 03/09/21 for chronic L shoulder pain.  Today, pt c/o R knee ongoing since mid-Dec. Pt works at [employer]. MOI: Pt was trying to resolve a short in the [place], and got his pant leg caught and flipped over the fence, "twisting" his R knee. Pt locates pain to the anterior-medial aspect of the R knee.  R Knee swelling: yes- worse in the morning Mechanical symptoms: no Aggravates: rotational or lateral motions Treatments tried: IBU, Tylenol   Pertinent review of systems: No fevers or chills  Relevant historical information: Has upcoming hernia surgery scheduled for March 13 Dr. Rosendo Villarreal   Exam:  BP (!) 170/98   Pulse 65   Ht '5\' 6"'$  (1.676 m)   Wt 151 lb (68.5 kg)   SpO2 99%   BMI 24.37 kg/m  General: Well Developed, well nourished, and in no acute distress.   MSK: Right knee: Mild effusion.  Normal motion with crepitation.  Tender palpation medial joint line. Stable ligamentous exam. Intact strength. Positive medial McMurray's test.    Lab and Radiology Results  Procedure: Real-time Ultrasound Guided Injection of right knee superior lateral patellar space Device: Philips Affiniti 50G Images permanently stored and available for review in PACS Ultrasound evaluation prior to injection reveals degenerative appearing medial meniscus. Verbal informed consent obtained.  Discussed risks and benefits of procedure. Warned about infection, bleeding, hyperglycemia damage to structures among others. Patient expresses understanding and agreement Time-out conducted.   Noted no overlying erythema, induration, or other signs of local infection.   Skin prepped in a sterile fashion.   Local anesthesia: Topical Ethyl chloride.   With sterile  technique and under real time ultrasound guidance: 40 mg of Kenalog and 2 mL Marcaine injected into knee joint. Fluid seen entering the joint capsule.   Completed without difficulty   Pain immediately resolved suggesting accurate placement of the medication.   Advised to call if fevers/chills, erythema, induration, drainage, or persistent bleeding.   Images permanently stored and available for review in the ultrasound unit.  Impression: Technically successful ultrasound guided injection.    X-ray images right knee obtained today personally and independently interpreted Mild medial compartment DJD.  No acute fractures. Await formal radiology review      Assessment and Plan: 72 y.o. male with right knee pain thought to be due to exacerbation of DJD or degenerative medial meniscus tear.  Plan for steroid injection today.  He has an upcoming laparoscopic inguinal hernia repair on March 13 with Dr. Ralene Villarreal.  I do not think the steroid injection today will interfere with his ability to have surgery on the 13th but I did send a letter to his general surgeon to make sure that the surgeon was aware.     PDMP not reviewed this encounter. Orders Placed This Encounter  Procedures   [place] LIMITED JOINT SPACE STRUCTURES LOW RIGHT(NO LINKED CHARGES)    Order Specific Question:   Reason for Exam (SYMPTOM  OR DIAGNOSIS REQUIRED)    Answer:   right knee pain    Order Specific Question:   Preferred imaging location?    Answer:   [facility]   DG Knee AP/LAT W/[place] Right    Standing Status:   Future  Number of Occurrences:   1    Standing Expiration Date:   04/28/2022    Order Specific Question:   Reason for Exam (SYMPTOM  OR DIAGNOSIS REQUIRED)    Answer:   right knee pain    Order Specific Question:   Preferred imaging location?    Answer:   [facility]   No orders of the defined types were placed in this encounter.    Discussed warning signs or  symptoms. Please see discharge instructions. Patient expresses understanding.   The above documentation has been reviewed and is accurate and complete Dennis Villarreal, M.D.

## 2022-03-29 ENCOUNTER — Ambulatory Visit: Payer: Self-pay

## 2022-03-29 ENCOUNTER — Encounter: Payer: Self-pay | Admitting: Family Medicine

## 2022-03-29 ENCOUNTER — Ambulatory Visit (INDEPENDENT_AMBULATORY_CARE_PROVIDER_SITE_OTHER): Payer: No Typology Code available for payment source

## 2022-03-29 ENCOUNTER — Ambulatory Visit: Payer: No Typology Code available for payment source | Admitting: Family Medicine

## 2022-03-29 VITALS — BP 170/98 | HR 65 | Ht 66.0 in | Wt 151.0 lb

## 2022-03-29 DIAGNOSIS — M25561 Pain in right knee: Secondary | ICD-10-CM

## 2022-03-29 NOTE — Patient Instructions (Signed)
Thank you for coming in today.   Please get an Xray today before you leave   Please use Voltaren gel (Generic Diclofenac Gel) up to 4x daily for pain as needed.  This is available over-the-counter as both the name brand Voltaren gel and the generic diclofenac gel.   You received an injection today. Seek immediate medical attention if the joint becomes red, extremely painful, or is oozing fluid.   Let me know if not getting better

## 2022-03-31 NOTE — Progress Notes (Signed)
Right knee x-ray shows some mild arthritis changes.

## 2022-04-01 ENCOUNTER — Ambulatory Visit: Payer: Self-pay | Admitting: General Surgery

## 2022-04-01 NOTE — Pre-Procedure Instructions (Signed)
Surgical Instructions    Your procedure is scheduled on April 06, 2022.  Report to [facility] Main Entrance "A" at 8:15 A.M., then check in with the Admitting office.  Call this number if you have problems the morning of surgery:  [phone]  If you have any questions prior to your surgery date call [phone]: Open Monday-Friday 8am-4pm If you experience any cold or flu symptoms such as cough, fever, chills, shortness of breath, etc. between now and your scheduled surgery, please notify us at the above number.     Remember:  Do not eat after midnight the night before your surgery  You may drink clear liquids until 7:15 AM the morning of your surgery.   Clear liquids allowed are: Water, Non-Citrus Juices (without pulp), Carbonated Beverages, Clear Tea, Black Coffee Only (NO MILK, CREAM OR POWDERED CREAMER of any kind), and Gatorade.  Patient Instructions  The night before surgery:  No food after midnight. ONLY clear liquids after midnight  The day of surgery (if you do NOT have diabetes):  Drink ONE (1) Pre-Surgery Clear Ensure by 7:15 AM the morning of surgery. Drink in one sitting. Do not sip.  This drink was given to you during your hospital  pre-op appointment visit.  Nothing else to drink after completing the  Pre-Surgery Clear Ensure.         If you have questions, please contact your surgeon's office.     Take these medicines the morning of surgery with A SIP OF WATER:  atorvastatin (LIPITOR)   pantoprazole (PROTONIX)   tamsulosin (FLOMAX)    As of today, STOP taking any Aspirin (unless otherwise instructed by your surgeon) Aleve, Naproxen, Ibuprofen, Motrin, Advil, Goody's, BC's, all herbal medications, fish oil, and all vitamins.                     Do NOT Smoke (Tobacco/Vaping) for 24 hours prior to your procedure.  If you use a CPAP at night, you may bring your mask/headgear for your overnight stay.   Contacts, glasses, piercing's, hearing aid's, dentures  or partials may not be worn into surgery, please bring cases for these belongings.    For patients admitted to the hospital, discharge time will be determined by your treatment team.   Patients discharged the day of surgery will not be allowed to drive home, and someone needs to stay with them for 24 hours.  SURGICAL WAITING ROOM VISITATION Patients having surgery or a procedure may have no more than 2 support people in the waiting area - these visitors may rotate.   Children under the age of 11 must have an adult with them who is not the patient. If the patient needs to stay at the hospital during part of their recovery, the visitor guidelines for inpatient rooms apply. Pre-op nurse will coordinate an appropriate time for 1 support person to accompany patient in pre-op.  This support person may not rotate.   Please refer to the [facility] website for the visitor guidelines for Inpatients (after your surgery is over and you are in a regular room).    Special instructions:   [facility]- Preparing For Surgery  Before surgery, you can play an important role. Because skin is not sterile, your skin needs to be as free of germs as possible. You can reduce the number of germs on your skin by washing with CHG (chlorahexidine gluconate) Soap before surgery.  CHG is an antiseptic cleaner which kills germs and bonds with  the skin to continue killing germs even after washing.    Oral Hygiene is also important to reduce your risk of infection.  Remember - BRUSH YOUR TEETH THE MORNING OF SURGERY WITH YOUR REGULAR TOOTHPASTE  Please do not use if you have an allergy to CHG or antibacterial soaps. If your skin becomes reddened/irritated stop using the CHG.  Do not shave (including legs and underarms) for at least 48 hours prior to first CHG shower. It is OK to shave your face.  Please follow these instructions carefully.   Shower the NIGHT BEFORE SURGERY and the MORNING OF SURGERY  If you chose to wash  your hair, wash your hair first as usual with your normal shampoo.  After you shampoo, rinse your hair and body thoroughly to remove the shampoo.  Use CHG Soap as you would any other liquid soap. You can apply CHG directly to the skin and wash gently with a scrungie or a clean washcloth.   Apply the CHG Soap to your body ONLY FROM THE NECK DOWN.  Do not use on open wounds or open sores. Avoid contact with your eyes, ears, mouth and genitals (private parts). Wash Face and genitals (private parts)  with your normal soap.   Wash thoroughly, paying special attention to the area where your surgery will be performed.  Thoroughly rinse your body with warm water from the neck down.  DO NOT shower/wash with your normal soap after using and rinsing off the CHG Soap.  Pat yourself dry with a CLEAN TOWEL.  Wear CLEAN PAJAMAS to bed the night before surgery  Place CLEAN SHEETS on your bed the night before your surgery  DO NOT SLEEP WITH PETS.   Day of Surgery: Take a shower with CHG soap. Do not wear jewelry or makeup Do not wear lotions, powders, perfumes/colognes, or deodorant. Do not shave 48 hours prior to surgery.  Men may shave face and neck. Do not bring valuables to the hospital.  [facility] is not responsible for any belongings or valuables. Do not wear nail polish, gel polish, artificial nails, or any other type of covering on natural nails (fingers and toes) If you have artificial nails or gel coating that need to be removed by a nail salon, please have this removed prior to surgery. Artificial nails or gel coating may interfere with anesthesia's ability to adequately monitor your vital signs. Wear Clean/Comfortable clothing the morning of surgery Remember to brush your teeth WITH YOUR REGULAR TOOTHPASTE.   Please read over the following fact sheets that you were given.    If you received a COVID test during your pre-op visit  it is requested that you wear a mask when out in  public, stay away from anyone that may not be feeling well and notify your surgeon if you develop symptoms. If you have been in contact with anyone that has tested positive in the last 10 days please notify you surgeon.

## 2022-04-04 ENCOUNTER — Encounter (HOSPITAL_COMMUNITY): Payer: Self-pay

## 2022-04-04 ENCOUNTER — Other Ambulatory Visit (HOSPITAL_COMMUNITY): Payer: No Typology Code available for payment source

## 2022-04-04 ENCOUNTER — Other Ambulatory Visit: Payer: Self-pay

## 2022-04-04 ENCOUNTER — Encounter (HOSPITAL_COMMUNITY)
Admission: RE | Admit: 2022-04-04 | Discharge: 2022-04-04 | Disposition: A | Discharge status: Home / Self Care | Payer: No Typology Code available for payment source | Source: Ambulatory Visit | Attending: General Surgery

## 2022-04-04 VITALS — BP 131/87 | HR 75 | Temp 97.8°F | Resp 18 | Ht 66.0 in | Wt 147.6 lb

## 2022-04-04 DIAGNOSIS — R1013 Epigastric pain: Secondary | ICD-10-CM | POA: Diagnosis not present

## 2022-04-04 DIAGNOSIS — Z01812 Encounter for preprocedural laboratory examination: Secondary | ICD-10-CM | POA: Insufficient documentation

## 2022-04-04 DIAGNOSIS — Z01818 Encounter for other preprocedural examination: Secondary | ICD-10-CM

## 2022-04-04 HISTORY — DX: Personal history of urinary calculi: Z87.442

## 2022-04-04 HISTORY — DX: Personal history of other diseases of the digestive system: Z87.19

## 2022-04-04 LAB — CBC
HCT: 42.6 % (ref 39.0–52.0)
Hemoglobin: 14.8 g/dL (ref 13.0–17.0)
MCH: 32.3 pg (ref 26.0–34.0)
MCHC: 34.7 g/dL (ref 30.0–36.0)
MCV: 93 fL (ref 80.0–100.0)
Platelets: 184 10*3/uL (ref 150–400)
RBC: 4.58 MIL/uL (ref 4.22–5.81)
RDW: 11.8 % (ref 11.5–15.5)
WBC: 8.4 10*3/uL (ref 4.0–10.5)
nRBC: 0 % (ref 0.0–0.2)

## 2022-04-04 NOTE — Progress Notes (Signed)
PCP - Dennis Villarreal Cardiologist - denies  PPM/ICD - denies  Chest x-ray - n/a EKG - n/a Stress Test - denies ECHO - denies Cardiac Cath - denies  Sleep Study - denies  ERAS Protcol -yes PRE-SURGERY Ensure or G2- ensure ordered and given  COVID TEST- not needed   Anesthesia review: no  Patient denies shortness of breath, fever, cough and chest pain at PAT appointment   All instructions explained to the patient, with a verbal understanding of the material. Patient agrees to go over the instructions while at home for a better understanding. Patient also instructed to self quarantine after being tested for COVID-19. The opportunity to ask questions was provided.

## 2022-04-04 NOTE — H&P (Signed)
Chief Complaint: New Consultation [facility])       History of Present Illness: Dennis Villarreal is a 72 y.o. male who is seen today as an office consultation at the request of Dr. Elease Hashimoto for evaluation of New Consultation [facility]) .   Patient is a 72 year old male, with history of anxiety, GERD, hyperlipidemia who comes in secondary to a left inguinal hernia. He states has been there for approximately several months.  He states that he has no significant pain or discomfort.  He does state that the bulge does get larger and smaller.  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  He had no signs or symptoms of incarceration or strangulation.   He had no previous abdominal surgery aside from the lap chole.     Review of Systems: A complete review of systems was obtained from the patient.  I have reviewed this information and discussed as appropriate with the patient.  See HPI as well for other ROS.   Review of Systems  Constitutional:  Negative for fever.  HENT:  Negative for congestion.   Eyes:  Negative for blurred vision.  Respiratory:  Negative for cough, shortness of breath and wheezing.   Cardiovascular:  Negative for chest pain and palpitations.  Gastrointestinal:  Negative for heartburn.  Genitourinary:  Negative for dysuria.  Musculoskeletal:  Negative for myalgias.  Skin:  Negative for rash.  Neurological:  Negative for dizziness and headaches.  Psychiatric/Behavioral:  Negative for depression and suicidal ideas.   All other systems reviewed and are negative.       Medical History: Past Medical History Past Medical History: Diagnosis Date  Anxiety    Arthritis    GERD (gastroesophageal reflux disease)    Hyperlipidemia        There is no problem list on file for this patient.     Past Surgical History Past Surgical History: Procedure Laterality Date  Shoulder arthroscopy with open rotator cuff repair and distal clavicle acrominectomy (Left)    04/13/2021  CHOLECYSTECTOMY      Shoulder surgery      TONSILLECTOMY      trigger finger release          Allergies Allergies Allergen Reactions  Levofloxacin Hives     REACTION: hives and GI upset   hives and GI upset   Other reaction(s): stomach upset, hives  REACTION: hives and GI upset  REACTION: hives and GI upset      Current Outpatient Medications on File Prior to Visit Medication Sig Dispense Refill  atorvastatin (LIPITOR) 20 MG tablet Take 1 tablet by mouth once daily      FLUoxetine (PROZAC) 40 MG capsule Take by mouth      pantoprazole (PROTONIX) 40 MG DR tablet Take 40 mg by mouth once daily      tadalafiL (CIALIS) 5 MG tablet Take 1 tablet by mouth once daily      tamsulosin (FLOMAX) 0.4 mg capsule Take 1 capsule by mouth once daily       No current facility-administered medications on file prior to visit.     Family History Family History Problem Relation Age of Onset  Hyperlipidemia (Elevated cholesterol) Mother    Stroke Mother    Coronary Artery Disease (Blocked arteries around heart) Mother        Social History   Tobacco Use Smoking Status Former  Types: Cigarettes  Quit date: 1984  Years since quitting: 40.1 Smokeless Tobacco Never     Social  History Social History    Socioeconomic History  Marital status: Married Tobacco Use  Smoking status: Former     Types: Cigarettes     Quit date: 1984     Years since quitting: 40.1  Smokeless tobacco: Never Vaping Use  Vaping Use: Never used Substance and Sexual Activity  Alcohol use: Defer  Drug use: Never      Objective:     Vitals:   02/21/22 1444 BP: 138/76 Pulse: 79 Temp: 37.1 C (98.7 F) SpO2: 98% Weight: 68.4 kg (150 lb 12.8 oz) Height: 167.6 cm ('5\' 6"'$ ) PainSc: 0-No pain   Body mass index is 24.34 kg/m. Physical Exam Constitutional:      Appearance: Normal appearance.  HENT:     Head: Normocephalic and atraumatic.     Nose: Nose normal. No congestion.      Mouth/Throat:     Mouth: Mucous membranes are moist.     Pharynx: Oropharynx is clear.  Eyes:     Pupils: Pupils are equal, round, and reactive to light.  Cardiovascular:     Rate and Rhythm: Normal rate and regular rhythm.     Pulses: Normal pulses.     Heart sounds: Normal heart sounds. No murmur heard.    No friction rub. No gallop.  Pulmonary:     Effort: Pulmonary effort is normal. No respiratory distress.     Breath sounds: Normal breath sounds. No stridor. No wheezing, rhonchi or rales.  Abdominal:     General: Abdomen is flat.     Hernia: A hernia is present. Hernia is present in the left inguinal area. There is no hernia in the right inguinal area.  Musculoskeletal:        General: Normal range of motion.     Cervical back: Normal range of motion.  Skin:    General: Skin is warm and dry.  Neurological:     General: No focal deficit present.     Mental Status: He is alert and oriented to person, place, and time.  Psychiatric:        Mood and Affect: Mood normal.        Thought Content: Thought content normal.          Assessment and Plan: Diagnoses and all orders for this visit:   Unilateral inguinal hernia without obstruction or gangrene, recurrence not specified     Dennis Villarreal is a 72 y.o. male    1.  We will proceed to the OR for a laparoscopic left inguinal hernia repair with mesh. 2. All risks and benefits were discussed with the patient, to generally include infection, bleeding, damage to surrounding structures, acute and chronic nerve pain, and recurrence. Alternatives were offered and described.  All questions were answered and the patient voiced understanding of the procedure and wishes to proceed at this point.             No follow-ups on file.   [name], MD, [facility], [facility]

## 2022-04-06 ENCOUNTER — Ambulatory Visit (HOSPITAL_BASED_OUTPATIENT_CLINIC_OR_DEPARTMENT_OTHER): Payer: No Typology Code available for payment source | Admitting: Certified Registered"

## 2022-04-06 ENCOUNTER — Ambulatory Visit (HOSPITAL_COMMUNITY)
Admission: RE | Admit: 2022-04-06 | Discharge: 2022-04-06 | Disposition: A | Discharge status: Home / Self Care | Payer: No Typology Code available for payment source | Source: Ambulatory Visit | Attending: General Surgery | Admitting: General Surgery

## 2022-04-06 ENCOUNTER — Ambulatory Visit (HOSPITAL_COMMUNITY): Payer: No Typology Code available for payment source | Admitting: Certified Registered"

## 2022-04-06 ENCOUNTER — Other Ambulatory Visit: Payer: Self-pay

## 2022-04-06 ENCOUNTER — Encounter (HOSPITAL_COMMUNITY): Payer: Self-pay | Admitting: General Surgery

## 2022-04-06 ENCOUNTER — Encounter (HOSPITAL_COMMUNITY)
Admission: RE | Disposition: A | Discharge status: Home / Self Care | Payer: Self-pay | Source: Ambulatory Visit | Attending: General Surgery

## 2022-04-06 DIAGNOSIS — E785 Hyperlipidemia, unspecified: Secondary | ICD-10-CM | POA: Diagnosis not present

## 2022-04-06 DIAGNOSIS — Z87891 Personal history of nicotine dependence: Secondary | ICD-10-CM | POA: Insufficient documentation

## 2022-04-06 DIAGNOSIS — F32A Depression, unspecified: Secondary | ICD-10-CM | POA: Insufficient documentation

## 2022-04-06 DIAGNOSIS — D176 Benign lipomatous neoplasm of spermatic cord: Secondary | ICD-10-CM | POA: Insufficient documentation

## 2022-04-06 DIAGNOSIS — K4090 Unilateral inguinal hernia, without obstruction or gangrene, not specified as recurrent: Secondary | ICD-10-CM | POA: Diagnosis not present

## 2022-04-06 DIAGNOSIS — K219 Gastro-esophageal reflux disease without esophagitis: Secondary | ICD-10-CM | POA: Insufficient documentation

## 2022-04-06 DIAGNOSIS — F419 Anxiety disorder, unspecified: Secondary | ICD-10-CM | POA: Diagnosis not present

## 2022-04-06 DIAGNOSIS — K449 Diaphragmatic hernia without obstruction or gangrene: Secondary | ICD-10-CM | POA: Diagnosis not present

## 2022-04-06 DIAGNOSIS — D1779 Benign lipomatous neoplasm of other sites: Secondary | ICD-10-CM | POA: Diagnosis not present

## 2022-04-06 HISTORY — PX: INGUINAL HERNIA REPAIR: SHX194

## 2022-04-06 SURGERY — REPAIR, HERNIA, INGUINAL, LAPAROSCOPIC
Anesthesia: General | Laterality: Left

## 2022-04-06 MED ORDER — EPHEDRINE SULFATE (PRESSORS) 50 MG/ML IJ SOLN
INTRAMUSCULAR | Status: DC | PRN
  Administered 2022-04-06 (×2): 10 mg via INTRAVENOUS

## 2022-04-06 MED ORDER — GLYCOPYRROLATE PF 0.2 MG/ML IJ SOSY
PREFILLED_SYRINGE | INTRAMUSCULAR | Status: AC
  Filled 2022-04-06: qty 1

## 2022-04-06 MED ORDER — CHLORHEXIDINE GLUCONATE CLOTH 2 % EX PADS: 6.0000 | MEDICATED_PAD | Freq: Once | CUTANEOUS | Status: DC

## 2022-04-06 MED ORDER — PHENYLEPHRINE 80 MCG/ML (10ML) SYRINGE FOR IV PUSH (FOR BLOOD PRESSURE SUPPORT)
PREFILLED_SYRINGE | INTRAVENOUS | Status: AC
  Filled 2022-04-06: qty 10

## 2022-04-06 MED ORDER — MIDAZOLAM HCL 2 MG/2ML IJ SOLN
INTRAMUSCULAR | Status: DC | PRN
  Administered 2022-04-06: 1 mg via INTRAVENOUS

## 2022-04-06 MED ORDER — SUGAMMADEX SODIUM 200 MG/2ML IV SOLN
INTRAVENOUS | Status: DC | PRN
  Administered 2022-04-06: 140 mg via INTRAVENOUS

## 2022-04-06 MED ORDER — CEFAZOLIN SODIUM-DEXTROSE 2-4 GM/100ML-% IV SOLN
2.0000 g | INTRAVENOUS | Status: AC
  Administered 2022-04-06: 2 g via INTRAVENOUS
  Filled 2022-04-06: qty 100

## 2022-04-06 MED ORDER — FENTANYL CITRATE (PF) 250 MCG/5ML IJ SOLN
INTRAMUSCULAR | Status: DC | PRN
  Administered 2022-04-06 (×2): 50 ug via INTRAVENOUS

## 2022-04-06 MED ORDER — TRAMADOL HCL 50 MG PO TABS: 50.0000 mg | ORAL_TABLET | Freq: Four times a day (QID) | ORAL | 0 refills | Status: DC | PRN

## 2022-04-06 MED ORDER — PROPOFOL 10 MG/ML IV BOLUS
INTRAVENOUS | Status: AC
  Filled 2022-04-06: qty 20

## 2022-04-06 MED ORDER — CHLORHEXIDINE GLUCONATE 0.12 % MT SOLN
15.0000 mL | Freq: Once | OROMUCOSAL | Status: AC
  Administered 2022-04-06: 15 mL via OROMUCOSAL
  Filled 2022-04-06: qty 15

## 2022-04-06 MED ORDER — ONDANSETRON HCL 4 MG/2ML IJ SOLN
INTRAMUSCULAR | Status: AC
  Filled 2022-04-06: qty 2

## 2022-04-06 MED ORDER — LIDOCAINE 2% (20 MG/ML) 5 ML SYRINGE
INTRAMUSCULAR | Status: AC
  Filled 2022-04-06: qty 5

## 2022-04-06 MED ORDER — MIDAZOLAM HCL 2 MG/2ML IJ SOLN
INTRAMUSCULAR | Status: AC
  Filled 2022-04-06: qty 2

## 2022-04-06 MED ORDER — ORAL CARE MOUTH RINSE: 15.0000 mL | Freq: Once | OROMUCOSAL | Status: AC

## 2022-04-06 MED ORDER — DEXAMETHASONE SODIUM PHOSPHATE 10 MG/ML IJ SOLN
INTRAMUSCULAR | Status: AC
  Filled 2022-04-06: qty 1

## 2022-04-06 MED ORDER — ROCURONIUM BROMIDE 10 MG/ML (PF) SYRINGE
PREFILLED_SYRINGE | INTRAVENOUS | Status: DC | PRN
  Administered 2022-04-06: 50 mg via INTRAVENOUS

## 2022-04-06 MED ORDER — DEXAMETHASONE SODIUM PHOSPHATE 10 MG/ML IJ SOLN
INTRAMUSCULAR | Status: DC | PRN
  Administered 2022-04-06: 10 mg via INTRAVENOUS

## 2022-04-06 MED ORDER — FENTANYL CITRATE (PF) 100 MCG/2ML IJ SOLN: 25.0000 ug | INTRAMUSCULAR | Status: DC | PRN

## 2022-04-06 MED ORDER — ENSURE PRE-SURGERY PO LIQD: 296.0000 mL | Freq: Once | ORAL | Status: DC

## 2022-04-06 MED ORDER — ACETAMINOPHEN 500 MG PO TABS
1000.0000 mg | ORAL_TABLET | ORAL | Status: AC
  Administered 2022-04-06: 1000 mg via ORAL
  Filled 2022-04-06: qty 2

## 2022-04-06 MED ORDER — ONDANSETRON HCL 4 MG/2ML IJ SOLN
INTRAMUSCULAR | Status: DC | PRN
  Administered 2022-04-06: 4 mg via INTRAVENOUS

## 2022-04-06 MED ORDER — GLYCOPYRROLATE 0.2 MG/ML IJ SOLN
INTRAMUSCULAR | Status: DC | PRN
  Administered 2022-04-06: .2 mg via INTRAVENOUS

## 2022-04-06 MED ORDER — LACTATED RINGERS IV SOLN: INTRAVENOUS | Status: DC

## 2022-04-06 MED ORDER — ONDANSETRON HCL 4 MG/2ML IJ SOLN: 4.0000 mg | Freq: Once | INTRAMUSCULAR | Status: DC | PRN

## 2022-04-06 MED ORDER — SUGAMMADEX SODIUM 500 MG/5ML IV SOLN
INTRAVENOUS | Status: AC
  Filled 2022-04-06: qty 5

## 2022-04-06 MED ORDER — ROCURONIUM BROMIDE 10 MG/ML (PF) SYRINGE
PREFILLED_SYRINGE | INTRAVENOUS | Status: AC
  Filled 2022-04-06: qty 10

## 2022-04-06 MED ORDER — 0.9 % SODIUM CHLORIDE (POUR BTL) OPTIME
TOPICAL | Status: DC | PRN
  Administered 2022-04-06: 1000 mL

## 2022-04-06 MED ORDER — BUPIVACAINE-EPINEPHRINE (PF) 0.25% -1:200000 IJ SOLN
INTRAMUSCULAR | Status: AC
  Filled 2022-04-06: qty 30

## 2022-04-06 MED ORDER — LIDOCAINE 2% (20 MG/ML) 5 ML SYRINGE
INTRAMUSCULAR | Status: DC | PRN
  Administered 2022-04-06: 60 mg via INTRAVENOUS

## 2022-04-06 MED ORDER — FENTANYL CITRATE (PF) 250 MCG/5ML IJ SOLN
INTRAMUSCULAR | Status: AC
  Filled 2022-04-06: qty 5

## 2022-04-06 MED ORDER — PROPOFOL 10 MG/ML IV BOLUS
INTRAVENOUS | Status: DC | PRN
  Administered 2022-04-06: 140 mg via INTRAVENOUS

## 2022-04-06 MED ORDER — BUPIVACAINE-EPINEPHRINE (PF) 0.25% -1:200000 IJ SOLN
INTRAMUSCULAR | Status: DC | PRN
  Administered 2022-04-06: 6 mL

## 2022-04-06 SURGICAL SUPPLY — 43 items
ADH SKN CLS APL DERMABOND .7 (GAUZE/BANDAGES/DRESSINGS) ×1
BAG COUNTER SPONGE SURGICOUNT (BAG) ×1 IMPLANT
BAG SPNG CNTER NS LX DISP (BAG) ×1
CANISTER SUCT 3000ML PPV (MISCELLANEOUS) IMPLANT
COVER SURGICAL LIGHT HANDLE (MISCELLANEOUS) ×1 IMPLANT
DERMABOND ADVANCED .7 DNX12 (GAUZE/BANDAGES/DRESSINGS) ×1 IMPLANT
DISSECTOR BLUNT TIP ENDO 5MM (MISCELLANEOUS) IMPLANT
ELECT REM PT RETURN 9FT ADLT (ELECTROSURGICAL) ×1
ELECTRODE REM PT RTRN 9FT ADLT (ELECTROSURGICAL) ×1 IMPLANT
GLOVE BIO SURGEON STRL SZ7.5 (GLOVE) ×2 IMPLANT
GOWN STRL REUS W/ TWL LRG LVL3 (GOWN DISPOSABLE) ×2 IMPLANT
GOWN STRL REUS W/ TWL XL LVL3 (GOWN DISPOSABLE) ×1 IMPLANT
GOWN STRL REUS W/TWL LRG LVL3 (GOWN DISPOSABLE) ×2
GOWN STRL REUS W/TWL XL LVL3 (GOWN DISPOSABLE) ×1
IRRIG SUCT STRYKERFLOW 2 WTIP (MISCELLANEOUS)
IRRIGATION SUCT STRKRFLW 2 WTP (MISCELLANEOUS) IMPLANT
KIT BASIN OR (CUSTOM PROCEDURE TRAY) ×1 IMPLANT
KIT TURNOVER KIT B (KITS) ×1 IMPLANT
MESH 3DMAX 4X6 LT LRG (Mesh General) IMPLANT
NDL INSUFFLATION 14GA 120MM (NEEDLE) IMPLANT
NEEDLE INSUFFLATION 14GA 120MM (NEEDLE) IMPLANT
NS IRRIG 1000ML POUR BTL (IV SOLUTION) ×1 IMPLANT
PAD ARMBOARD 7.5X6 YLW CONV (MISCELLANEOUS) ×2 IMPLANT
RELOAD STAPLE 4.0 BLU F/HERNIA (INSTRUMENTS) IMPLANT
RELOAD STAPLE 4.8 BLK F/HERNIA (STAPLE) IMPLANT
RELOAD STAPLE HERNIA 4.0 BLUE (INSTRUMENTS) IMPLANT
RELOAD STAPLE HERNIA 4.8 BLK (STAPLE) IMPLANT
SCISSORS LAP 5X35 DISP (ENDOMECHANICALS) ×1 IMPLANT
SET TUBE SMOKE EVAC HIGH FLOW (TUBING) ×1 IMPLANT
STAPLER HERNIA 12 8.5 360D (INSTRUMENTS) IMPLANT
SUT MNCRL AB 4-0 PS2 18 (SUTURE) ×1 IMPLANT
SUT VIC AB 1 CT1 27 (SUTURE)
SUT VIC AB 1 CT1 27XBRD ANBCTR (SUTURE) IMPLANT
SYRINGE TOOMEY DISP (SYRINGE) ×1 IMPLANT
TOWEL GREEN STERILE (TOWEL DISPOSABLE) ×1 IMPLANT
TOWEL GREEN STERILE FF (TOWEL DISPOSABLE) ×1 IMPLANT
TRAY FOLEY W/BAG SLVR 14FR (SET/KITS/TRAYS/PACK) ×1 IMPLANT
TRAY LAPAROSCOPIC MC (CUSTOM PROCEDURE TRAY) ×1 IMPLANT
TROCAR OPTICAL SHORT 5MM (TROCAR) ×1 IMPLANT
TROCAR OPTICAL SLV SHORT 5MM (TROCAR) ×1 IMPLANT
TROCAR Z THREAD OPTICAL 12X100 (TROCAR) ×1 IMPLANT
WARMER LAPAROSCOPE (MISCELLANEOUS) ×1 IMPLANT
WATER STERILE IRR 1000ML POUR (IV SOLUTION) ×1 IMPLANT

## 2022-04-06 NOTE — Op Note (Signed)
04/06/2022  10:49 AM  PATIENT:  Dennis Villarreal  72 y.o. male  PRE-OPERATIVE DIAGNOSIS:  LEFT INGUINAL HERNIA  POST-OPERATIVE DIAGNOSIS:  LEFT  INDIRECT INGUINAL HERNIA, CORD LIPOMA  PROCEDURE:  Procedure(s): LAPAROSCOPIC LEFT INGUINAL HERNIA REPAIR WITH MESH (Left)  SURGEON:  Surgeon(s) and Role:    * [name], MD - Primary  ASSISTANTS: none   ANESTHESIA:   local and general  EBL:  minimal   BLOOD ADMINISTERED:none  DRAINS: none   LOCAL MEDICATIONS USED:  BUPIVICAINE   SPECIMEN:  No Specimen  DISPOSITION OF SPECIMEN:  N/A  COUNTS:  YES  TOURNIQUET:  * No tourniquets in log *  DICTATION: .Dragon Dictation Counts: reported as correct x 2   Findings:  The patient had a moderate sized left indirect hernia and a large  sized cord lipoma   Indications for procedure:  The patient is a 72 year old male with a left inguinal hernia for several months. Patient complained of symptomatology to the left inguinal area. The patient was taken back for elective inguinal hernia repair.   Details of the procedure: The patient was taken back to the operating room. The patient was placed in supine position with bilateral SCDs in place.  The patient was prepped and draped in the usual sterile fashion.  After appropriate anitbiotics were confirmed, a time-out was confirmed and all facts were verified.   0.25% Marcaine was used to infiltrate the umbilical area. A 11-blade was used to cut down the skin and blunt dissection was used to get the anterior fashion.  The anterior fascia was incised approximately 1 cm and the muscles were retracted laterally. Blunt dissection was then used to create a space in the preperitoneal area. At this time a 10 mm camera was then introduced into the space and advanced the pubic tubercle and a 12 mm trocar was placed over this and insufflation was started.  At this time and space was created from medial to laterally the preperitoneal space.  Cooper's  ligament was initially cleaned off.  The hernia sac was identified in the indirect space. Dissection of the hernia sac and cord structures was undertaken the vas deferens was identified and protected in all parts of the case. There was a large cord lipoma that was dissected out of the internal inguinal ring.   Once the hernia sac was taken down to approximately the umbilicus a Left Bard 3D Max mesh, size: Large, was  introduced into the preperitoneal space.  The mesh was brought over to cover the direct and indirect hernia spaces.  This was anchored into place and secured to Cooper's ligament with 4.51m staples from a Coviden hernia stapler. It was anchored to the anterior abdominal wall with 4.8 mm staples. The hernia sac was seen lying posterior to the mesh. There was no staples placed laterally. The insufflation was evacuated and the peritoneum was seen posterior to the mesh. The trochars were removed. The anterior fascia was reapproximated using #1 Vicryl on a UR- 6.  Intra-abdominal air was evacuated and the Veress needle removed. The skin was reapproximated using 4-0 Monocryl subcuticular fashion and Dermabond. The patient was awakened from general anesthesia and taken to recovery in stable condition.    PLAN OF CARE: Discharge to home after PACU  PATIENT DISPOSITION:  PACU - hemodynamically stable.   Delay start of Pharmacological VTE agent (>24hrs) due to surgical blood loss or risk of bleeding: not applicable

## 2022-04-06 NOTE — Anesthesia Postprocedure Evaluation (Signed)
Anesthesia Post Note  Patient: Dennis Villarreal  Procedure(s) Performed: LAPAROSCOPIC LEFT INGUINAL HERNIA REPAIR WITH MESH (Left)     Patient location during evaluation: PACU Anesthesia Type: General Level of consciousness: awake and alert Pain management: pain level controlled Vital Signs Assessment: post-procedure vital signs reviewed and stable Respiratory status: spontaneous breathing, nonlabored ventilation and respiratory function stable Cardiovascular status: stable and blood pressure returned to baseline Anesthetic complications: no   No notable events documented.  Last Vitals:  Vitals:   04/06/22 1115 04/06/22 1130  BP: (!) 155/90 (!) 153/84  Pulse: 84 79  Resp: 14 13  Temp:  36.7 C  SpO2: 96% 97%    Last Pain:  Vitals:   04/06/22 1130  TempSrc:   PainSc: [place]

## 2022-04-06 NOTE — Discharge Instructions (Signed)
[facility] _______[facility]   INGUINAL HERNIA REPAIR: POST OP INSTRUCTIONS  Always review your discharge instruction sheet given to you by the facility where your surgery was performed. IF YOU HAVE DISABILITY OR FAMILY LEAVE FORMS, YOU MUST BRING THEM TO THE OFFICE FOR PROCESSING.   DO NOT GIVE THEM TO YOUR DOCTOR.  1. A  prescription for pain medication may be given to you upon discharge.  Take your pain medication as prescribed, if needed.  If narcotic pain medicine is not needed, then you may take acetaminophen (Tylenol) or ibuprofen (Advil) as needed. 2. Take your usually prescribed medications unless otherwise directed. If you need a refill on your pain medication, please contact your pharmacy.  They will contact our office to request authorization. Prescriptions will not be filled after 5 pm or on week-ends. 3. You should follow a light diet the first 24 hours after arrival home, such as soup and crackers, etc.  Be sure to include lots of fluids daily.  Resume your normal diet the day after surgery. 4.Most patients will experience some swelling and bruising around the umbilicus or in the groin and scrotum.  Ice packs and reclining will help.  Swelling and bruising can take several days to resolve.  6. It is common to experience some constipation if taking pain medication after surgery.  Increasing fluid intake and taking a stool softener (such as Colace) will usually help or prevent this problem from occurring.  A mild laxative (Milk of Magnesia or Miralax) should be taken according to package directions if there are no bowel movements after 48 hours. 7. Unless discharge instructions indicate otherwise, you may remove your bandages 24-48 hours after surgery, and you may shower at that time.  You may have steri-strips (small skin tapes) in place directly over the incision.  These strips should be left on the skin for 7-10 days.  If your surgeon used skin glue on the incision, you may  shower in 24 hours.  The glue will flake off over the next 2-3 weeks.  Any sutures or staples will be removed at the office during your follow-up visit. 8. ACTIVITIES:  You may resume regular (light) daily activities beginning the next day--such as daily self-care, walking, climbing stairs--gradually increasing activities as tolerated.  You may have sexual intercourse when it is comfortable.  Refrain from any heavy lifting or straining until approved by your doctor.  a.You may drive when you are no longer taking prescription pain medication, you can comfortably wear a seatbelt, and you can safely maneuver your car and apply brakes. b.RETURN TO WORK:   _____________________________________________  9.You should see your doctor in the office for a follow-up appointment approximately 2-3 weeks after your surgery.  Make sure that you call for this appointment within a day or two after you arrive home to insure a convenient appointment time. 10.OTHER INSTRUCTIONS: _________________________    _____________________________________  WHEN TO CALL YOUR DOCTOR: Fever over 101.0 Inability to urinate Nausea and/or vomiting Extreme swelling or bruising Continued bleeding from incision. Increased pain, redness, or drainage from the incision  The clinic staff is available to answer your questions during regular business hours.  Please don't hesitate to call and ask to speak to one of the nurses for clinical concerns.  If you have a medical emergency, go to the nearest emergency room or call 911.  A surgeon from [facility] is always on call at the hospital   [address], [address], [place],   [place]  [address] ?  [address] [address], [place], [place]   [address] [phone] ? [phone] ? FAX [phone] Web site: [email or web address]  

## 2022-04-06 NOTE — Interval H&P Note (Signed)
History and Physical Interval Note:  04/06/2022 9:25 AM  Dennis Villarreal  has presented today for surgery, with the diagnosis of LEFT INGUINAL HERNIA.  The various methods of treatment have been discussed with the patient and family. After consideration of risks, benefits and other options for treatment, the patient has consented to  Procedure(s): LAPAROSCOPIC LEFT INGUINAL HERNIA REPAIR WITH MESH (Left) as a surgical intervention.  The patient's history has been reviewed, patient examined, no change in status, stable for surgery.  I have reviewed the patient's chart and labs.  Questions were answered to the patient's satisfaction.     [name]

## 2022-04-06 NOTE — Anesthesia Procedure Notes (Signed)
Procedure Name: Intubation Date/Time: 04/06/2022 10:18 AM  Performed by: Reggie Pile, CRNAPre-anesthesia Checklist: Patient identified, Emergency Drugs available, Suction available and Patient being monitored Patient Re-evaluated:Patient Re-evaluated prior to induction Oxygen Delivery Method: Circle system utilized Preoxygenation: Pre-oxygenation with 100% oxygen Induction Type: IV induction Ventilation: Mask ventilation with difficulty Laryngoscope Size: Miller and 2 Grade View: Grade I Tube type: Oral Tube size: 7.0 mm Number of attempts: 1 Airway Equipment and Method: Stylet and Oral airway Placement Confirmation: ETT inserted through vocal cords under direct vision, positive ETCO2 and breath sounds checked- equal and bilateral Secured at: 22 cm Tube secured with: Tape Dental Injury: Teeth and Oropharynx as per pre-operative assessment

## 2022-04-06 NOTE — Transfer of Care (Signed)
Immediate Anesthesia Transfer of Care Note  Patient: Dennis Villarreal  Procedure(s) Performed: LAPAROSCOPIC LEFT INGUINAL HERNIA REPAIR WITH MESH (Left)  Patient Location: PACU  Anesthesia Type:General  Level of Consciousness: awake and alert   Airway & Oxygen Therapy: Patient Spontanous Breathing and Patient connected to face mask oxygen  Post-op Assessment: Report given to RN, Post -op Vital signs reviewed and stable, and Patient moving all extremities  Post vital signs: Reviewed and stable  Last Vitals:  Vitals Value Taken Time  BP 172/95 04/06/22 1101  Temp    Pulse 93 04/06/22 1103  Resp 20 04/06/22 1103  SpO2 99 % 04/06/22 1103  Vitals shown include unvalidated device data.  Last Pain:  Vitals:   04/06/22 0838  TempSrc:   PainSc: 0-No pain         Complications: No notable events documented.

## 2022-04-06 NOTE — Anesthesia Preprocedure Evaluation (Addendum)
Anesthesia Evaluation  Patient identified by MRN, date of birth, ID band Patient awake    Reviewed: Allergy & Precautions, NPO status , Patient's Chart, lab work & pertinent test results  History of Anesthesia Complications Negative for: history of anesthetic complications  Airway Mallampati: III  TM Distance: >3 FB Neck ROM: Full    Dental  (+) Dental Advisory Given, Teeth Intact   Pulmonary former smoker   Pulmonary exam normal        Cardiovascular negative cardio ROS Normal cardiovascular exam     Neuro/Psych  PSYCHIATRIC DISORDERS Anxiety Depression     Hx diplopia, vision loss     GI/Hepatic Neg liver ROS, hiatal hernia,GERD  Medicated and Controlled,,  Endo/Other  negative endocrine ROS    Renal/GU negative Renal ROS     Musculoskeletal negative musculoskeletal ROS (+)    Abdominal   Peds  Hematology negative hematology ROS (+)   Anesthesia Other Findings   Reproductive/Obstetrics                             Anesthesia Physical Anesthesia Plan  ASA: 2  Anesthesia Plan: General   Post-op Pain Management: Tylenol PO (pre-op)*   Induction: Intravenous  PONV Risk Score and Plan: 2 and Treatment may vary due to age or medical condition, Ondansetron and Dexamethasone  Airway Management Planned: Oral ETT  Additional Equipment: None  Intra-op Plan:   Post-operative Plan: Extubation in OR  Informed Consent: I have reviewed the patients History and Physical, chart, labs and discussed the procedure including the risks, benefits and alternatives for the proposed anesthesia with the patient or authorized representative who has indicated his/her understanding and acceptance.     Dental advisory given  Plan Discussed with: CRNA and Anesthesiologist  Anesthesia Plan Comments:        Anesthesia Quick Evaluation

## 2022-04-07 ENCOUNTER — Encounter (HOSPITAL_COMMUNITY): Payer: Self-pay | Admitting: General Surgery

## 2022-04-12 ENCOUNTER — Ambulatory Visit: Payer: No Typology Code available for payment source | Admitting: Behavioral Health

## 2022-05-02 ENCOUNTER — Ambulatory Visit (INDEPENDENT_AMBULATORY_CARE_PROVIDER_SITE_OTHER): Payer: No Typology Code available for payment source | Admitting: Behavioral Health

## 2022-05-02 DIAGNOSIS — F32A Depression, unspecified: Secondary | ICD-10-CM | POA: Diagnosis not present

## 2022-05-02 DIAGNOSIS — F419 Anxiety disorder, unspecified: Secondary | ICD-10-CM

## 2022-05-02 NOTE — Progress Notes (Signed)
[facility] Counselor Initial Adult Exam  Name: Dennis Villarreal Date: 05/02/2022 MRN: [id number] DOB: 1950-12-13 PCP: Kristian Covey, MD  Time spent: 60 min Caregility video; Pt is home in private & Provider working remotely from [facility] Office  Guardian/Payee:  Self    Paperwork requested: No   Reason for Visit /Presenting Problem: Need to cont psychotherapy for marital conflict that has lasted for 2 yrs now since the initial Dx of Wife Gammel's brain tumor  Mental Status Exam: Appearance:   Casual     Behavior:  Appropriate and Sharing  Motor:  Normal  Speech/Language:   Clear and Coherent and Normal Rate  Affect:  Appropriate  Mood:  normal  Thought process:  normal  Thought content:    WNL  Sensory/Perceptual disturbances:    WNL  Orientation:  oriented to person, place, and time/date  Attention:  Good  Concentration:  Good  Memory:  WNL  Fund of knowledge:   Good  Insight:    Good  Judgment:   Good  Impulse Control:  Good    Risk Assessment: Danger to Self:  No Self-injurious Behavior: No Danger to Others: No Duty to Warn:no Physical Aggression / Violence:No  Access to Firearms a concern: No  Gang Involvement:No  Patient / guardian was educated about steps to take if suicide or homicide risk level increases between visits: yes While future psychiatric events cannot be accurately predicted, the patient does not currently require acute inpatient psychiatric care and does not currently meet [facility] involuntary commitment criteria.  Substance Abuse History: Current substance abuse: No     Past Psychiatric History:   Previous psychological history is significant for marital conflict that is causing distress Outpatient Providers: Dr. Rene Kocher, MD History of Psych Hospitalization: No  Psychological Testing:  NA    Abuse History:  Victim of: No.,  NA    Report needed: No. Victim of Neglect:No. Perpetrator of  NA   Witness /  Exposure to Domestic Violence: No   Protective Services Involvement: No  Witness to [employer] Violence:  No   Family History:  Family History  Problem Relation Age of Onset   Heart disease Mother        CVA and cardiac arrhythmia, ? 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  Religion/Sprituality/World View: Non specified  Any cultural differences that may affect / interfere with treatment:  None noted  Recreation/Hobbies: Karate w/grp of friends; Pt has a Designer, television/film set  Stressors: Marital or family conflict  , health status changes  Strengths: Supportive Relationships, Family, Friends, Journalist, newspaper, and Able to Communicate Effectively  Barriers:  None noted today   Legal History: Pending legal issue / charges: The patient has no significant history of legal issues. History of legal issue / charges:  NA  Medical History/Surgical History: reviewed  Past Medical History:  Diagnosis Date   ALLERGIC RHINITIS 09/04/2009   Basal cell carcinoma    Cancer (HCC) 2012   melanoma   DYSLIPIDEMIA 09/04/2009   GERD 09/04/2009   High cholesterol    just above borderline per patient    History of hiatal hernia    History of kidney stones    Pneumonia 2014   Hx    Past Surgical History:  Procedure Laterality Date   CHOLECYSTECTOMY     GALLBLADDER SURGERY  01/2018   INGUINAL HERNIA REPAIR Left  04/06/2022   Procedure: LAPAROSCOPIC LEFT INGUINAL HERNIA REPAIR WITH MESH;  Surgeon: Axel Filler, MD;  Location: [facility] OR;  Service: General;  Laterality: Left;   SHOULDER ARTHROSCOPY WITH OPEN ROTATOR CUFF REPAIR AND DISTAL CLAVICLE ACROMINECTOMY Left 04/13/2021   Procedure: LEFT SHOULDER ARTHROSCOPY, DEBRIDEMENT, MINI OPEN ROTATOR CUFF TEAR REPAIR, BICEPS TENODESIS;  Surgeon: Cammy Copa, MD;  Location: [facility] OR;  Service: Orthopedics;  Laterality: Left;   SHOULDER SURGERY  1986, 2009   left shoulder scromoplasty   TONSILLECTOMY     TRIGGER FINGER RELEASE Left     Medications: Current Outpatient Medications  Medication Sig Dispense Refill   atorvastatin (LIPITOR) 20 MG tablet TAKE ONE TABLET BY MOUTH DAILY 90 tablet 1   FLUoxetine (PROZAC) 40 MG capsule Take 1 capsule (40 mg total) by mouth daily. (Patient not taking: Reported on 04/05/2022) 90 capsule 0   pantoprazole (PROTONIX) 40 MG tablet Take 1 tablet (40 mg total) by mouth daily. 90 tablet 2   psyllium (METAMUCIL) 58.6 % powder Take 1 packet by mouth daily.     tadalafil (CIALIS) 5 MG tablet TAKE 1 TABLET BY MOUTH DAILY (Patient not taking: Reported on 04/05/2022) 30 tablet 1   tamsulosin (FLOMAX) 0.4 MG CAPS capsule TAKE 1 CAPSULE BY MOUTH DAILY 90 capsule 0   traMADol (ULTRAM) 50 MG tablet Take 1 tablet (50 mg total) by mouth every 6 (six) hours as needed. 20 tablet 0   No current facility-administered medications for this visit.    Allergies  Allergen Reactions   Aleve [Naproxen] Hives, Swelling and Other (See Comments)    Can take ibuprofen   Levaquin [Levofloxacin]     hives and GI upset   Percocet [Oxycodone-Acetaminophen]     Pt prefers not to take     Diagnoses:  Anxiety and depression  Plan of Care: Dennis Villarreal will attend all sessions as scheduled every 3 to 4 wks. He will cont to pursue his positivity & healthy lifestyle changes to promote his mental well being.  Target Date: 06/08/2022  Progress: 4  Frequency:  Every 3-4 wks  Modality: Claretta Fraise, LMFT

## 2022-05-02 NOTE — Progress Notes (Signed)
                Taylin Leder L Cierra Rothgeb, LMFT 

## 2022-05-09 ENCOUNTER — Other Ambulatory Visit: Payer: Self-pay | Admitting: Family Medicine

## 2022-05-23 ENCOUNTER — Ambulatory Visit: Payer: No Typology Code available for payment source | Admitting: Behavioral Health

## 2022-05-24 ENCOUNTER — Ambulatory Visit: Payer: No Typology Code available for payment source | Admitting: Behavioral Health

## 2022-07-25 DIAGNOSIS — D225 Melanocytic nevi of trunk: Secondary | ICD-10-CM | POA: Diagnosis not present

## 2022-07-25 DIAGNOSIS — Z789 Other specified health status: Secondary | ICD-10-CM | POA: Diagnosis not present

## 2022-07-25 DIAGNOSIS — Z85828 Personal history of other malignant neoplasm of skin: Secondary | ICD-10-CM | POA: Diagnosis not present

## 2022-07-25 DIAGNOSIS — Z85820 Personal history of malignant melanoma of skin: Secondary | ICD-10-CM | POA: Diagnosis not present

## 2022-07-25 DIAGNOSIS — L538 Other specified erythematous conditions: Secondary | ICD-10-CM | POA: Diagnosis not present

## 2022-07-25 DIAGNOSIS — L821 Other seborrheic keratosis: Secondary | ICD-10-CM | POA: Diagnosis not present

## 2022-07-25 DIAGNOSIS — L570 Actinic keratosis: Secondary | ICD-10-CM | POA: Diagnosis not present

## 2022-07-25 DIAGNOSIS — L814 Other melanin hyperpigmentation: Secondary | ICD-10-CM | POA: Diagnosis not present

## 2022-07-25 DIAGNOSIS — Z08 Encounter for follow-up examination after completed treatment for malignant neoplasm: Secondary | ICD-10-CM | POA: Diagnosis not present

## 2022-07-25 DIAGNOSIS — L820 Inflamed seborrheic keratosis: Secondary | ICD-10-CM | POA: Diagnosis not present

## 2022-07-25 DIAGNOSIS — R208 Other disturbances of skin sensation: Secondary | ICD-10-CM | POA: Diagnosis not present

## 2022-08-14 ENCOUNTER — Other Ambulatory Visit: Payer: Self-pay | Admitting: Family Medicine

## 2022-08-29 ENCOUNTER — Ambulatory Visit (INDEPENDENT_AMBULATORY_CARE_PROVIDER_SITE_OTHER): Payer: No Typology Code available for payment source | Admitting: Family Medicine

## 2022-08-29 ENCOUNTER — Encounter: Payer: Self-pay | Admitting: Family Medicine

## 2022-08-29 ENCOUNTER — Ambulatory Visit: Payer: No Typology Code available for payment source

## 2022-08-29 VITALS — BP 146/70 | HR 65 | Temp 98.5°F | Ht 66.0 in | Wt 156.4 lb

## 2022-08-29 DIAGNOSIS — M25552 Pain in left hip: Secondary | ICD-10-CM | POA: Diagnosis not present

## 2022-08-29 DIAGNOSIS — M79675 Pain in left toe(s): Secondary | ICD-10-CM

## 2022-08-29 NOTE — Patient Instructions (Signed)
Walk as tolerated  Try to use good support shoes, as discussed.

## 2022-08-29 NOTE — Progress Notes (Signed)
Established Patient Office Visit  Subjective   Patient ID: Dennis Villarreal, male    DOB: [birth year]  Age: 72 y.o. MRN: [id number]  Chief Complaint  Patient presents with   Foot Pain    Patient complains of left foot pain, Patient reports injury on pinky toes, x1 week. Tried Ibuprofen     HPI   Seen with left fifth toe pain.  He states that he stumped this on a log when he went out barefooted a week ago Sunday.  After this happened he noticed some angulation of the toe laterally and he basically reached down and states he put his toe "back in place ".  Was able to ambulate afterwards.  Still has some soreness and swelling.  Has been icing this at night.  His job requires quite a bit of walking and being on his feet.  No other injuries reported.  Past Medical History:  Diagnosis Date   ALLERGIC RHINITIS 09/04/2009   Basal cell carcinoma    Cancer (HCC) 2012   melanoma   DYSLIPIDEMIA 09/04/2009   GERD 09/04/2009   High cholesterol    just above borderline per patient    History of hiatal hernia    History of kidney stones    Pneumonia 2014   Hx   Past Surgical History:  Procedure Laterality Date   CHOLECYSTECTOMY     GALLBLADDER SURGERY  01/2018   INGUINAL HERNIA REPAIR Left 04/06/2022   Procedure: LAPAROSCOPIC LEFT INGUINAL HERNIA REPAIR WITH MESH;  Surgeon: Axel Filler, MD;  Location: [facility] OR;  Service: General;  Laterality: Left;   SHOULDER ARTHROSCOPY WITH OPEN ROTATOR CUFF REPAIR AND DISTAL CLAVICLE ACROMINECTOMY Left 04/13/2021   Procedure: LEFT SHOULDER ARTHROSCOPY, DEBRIDEMENT, MINI OPEN ROTATOR CUFF TEAR REPAIR, BICEPS TENODESIS;  Surgeon: Cammy Copa, MD;  Location: [facility] OR;  Service: Orthopedics;  Laterality: Left;   SHOULDER SURGERY  1986, 2009   left shoulder scromoplasty   TONSILLECTOMY     TRIGGER FINGER RELEASE Left     reports that he quit smoking about 40 years ago. His smoking use included cigarettes. He started smoking about 60 years ago. He has  a 20 pack-year smoking history. He has never used smokeless tobacco. He reports current alcohol use. He reports that he does not use drugs. family history includes Glaucoma in his brother and father; Heart disease in his mother; Hypertension in his mother; Melanoma in his father; Stroke in his mother. Allergies  Allergen Reactions   Aleve [Naproxen] Hives, Swelling and Other (See Comments)    Can take ibuprofen   Levaquin [Levofloxacin]     hives and GI upset   Percocet [Oxycodone-Acetaminophen]     Pt prefers not to take     Review of Systems  Neurological:  Negative for focal weakness.      Objective:     BP (!) 146/70 (BP Location: Left Arm, Patient Position: Sitting, Cuff Size: Normal)   Pulse 65   Temp 98.5 F (36.9 C) (Oral)   Ht 5\' 6"  (1.676 m)   Wt 156 lb 6.4 oz (70.9 kg)   SpO2 97%   BMI 25.24 kg/m  BP Readings from Last 3 Encounters:  08/29/22 (!) 146/70  04/06/22 (!) 153/84  04/04/22 131/87   Wt Readings from Last 3 Encounters:  08/29/22 156 lb 6.4 oz (70.9 kg)  04/06/22 140 lb (63.5 kg)  04/04/22 147 lb 9.6 oz (67 kg)      Physical Exam Vitals reviewed.  Constitutional:  Appearance: Normal appearance.  Cardiovascular:     Rate and Rhythm: Normal rate and regular rhythm.  Musculoskeletal:     Comments: Left foot examined.  He has some mild diffuse swelling of the left fifth toe and some mild proximal phalanx tenderness with no metatarsal tenderness.  Neurological:     Mental Status: He is alert.      No results found for any visits on 08/29/22.    The 10-year ASCVD risk score (Arnett DK, et al., 2019) is: 20.9%    Assessment & Plan:   Problem List Items Addressed This Visit   None Visit Diagnoses     Toe pain, left    -  Primary   Relevant Orders   DG Toe 5th Left     Patient describes what sounds like dislocation of the toe and he was able to put this back in place 8 days ago.  Now he has some diffuse swelling and pain.  Obtain  plain films to rule out acute bony injury  -No obvious fracture or other acute bony abnormality noted.  This will be over read. -We recommended good comfortable supportive shoes for walking -He is aware this may be sore for several weeks.  Follow-up for any persistent or worsening pain  No follow-ups on file.    [name], MD

## 2022-09-08 ENCOUNTER — Other Ambulatory Visit: Payer: Self-pay

## 2022-09-08 MED ORDER — ATORVASTATIN CALCIUM 20 MG PO TABS: ORAL_TABLET | ORAL | 0 refills | Status: DC

## 2022-09-12 ENCOUNTER — Other Ambulatory Visit: Payer: Self-pay | Admitting: Family Medicine

## 2022-09-13 ENCOUNTER — Ambulatory Visit (INDEPENDENT_AMBULATORY_CARE_PROVIDER_SITE_OTHER): Payer: No Typology Code available for payment source | Admitting: Family Medicine

## 2022-09-13 ENCOUNTER — Encounter: Payer: Self-pay | Admitting: Family Medicine

## 2022-09-13 ENCOUNTER — Other Ambulatory Visit: Payer: Self-pay | Admitting: Family Medicine

## 2022-09-13 VITALS — BP 134/78 | HR 75 | Temp 98.5°F | Ht 66.0 in | Wt 157.0 lb

## 2022-09-13 DIAGNOSIS — S90869A Insect bite (nonvenomous), unspecified foot, initial encounter: Secondary | ICD-10-CM | POA: Diagnosis not present

## 2022-09-13 DIAGNOSIS — W57XXXA Bitten or stung by nonvenomous insect and other nonvenomous arthropods, initial encounter: Secondary | ICD-10-CM | POA: Diagnosis not present

## 2022-09-13 NOTE — Progress Notes (Signed)
Established Patient Office Visit  Subjective   Patient ID: Dennis Villarreal, male    DOB: [birth year]  Age: 71 y.o. MRN: [id number]  Chief Complaint  Patient presents with   Insect Bite    Patient complains of insect bites, x6 days     HPI   [name] is seen following several insect bites which occurred over a week ago when he was down in [place].  He states he was getting ready to go into a restaurant and noticed several very small black ants were covering his feet and lower legs.  He had several stinging sensations and this was followed by significant amount of swelling.  He did not have any generalized rash.  No truncal or upper extremity rash.  No anaphylaxis symptoms.  Took some Benadryl.  Swelling and stinging have gone down significantly since then.  No fevers or chills.  No visible rash at this time.  Past Medical History:  Diagnosis Date   ALLERGIC RHINITIS 09/04/2009   Basal cell carcinoma    Cancer (HCC) 2012   melanoma   DYSLIPIDEMIA 09/04/2009   GERD 09/04/2009   High cholesterol    just above borderline per patient    History of hiatal hernia    History of kidney stones    Pneumonia 2014   Hx   Past Surgical History:  Procedure Laterality Date   CHOLECYSTECTOMY     GALLBLADDER SURGERY  01/2018   INGUINAL HERNIA REPAIR Left 04/06/2022   Procedure: LAPAROSCOPIC LEFT INGUINAL HERNIA REPAIR WITH MESH;  Surgeon: Axel Filler, MD;  Location: [facility] OR;  Service: General;  Laterality: Left;   SHOULDER ARTHROSCOPY WITH OPEN ROTATOR CUFF REPAIR AND DISTAL CLAVICLE ACROMINECTOMY Left 04/13/2021   Procedure: LEFT SHOULDER ARTHROSCOPY, DEBRIDEMENT, MINI OPEN ROTATOR CUFF TEAR REPAIR, BICEPS TENODESIS;  Surgeon: Cammy Copa, MD;  Location: [facility] OR;  Service: Orthopedics;  Laterality: Left;   SHOULDER SURGERY  1986, 2009   left shoulder scromoplasty   TONSILLECTOMY     TRIGGER FINGER RELEASE Left     reports that he quit smoking about 40 years ago. His smoking use  included cigarettes. He started smoking about 60 years ago. He has a 20 pack-year smoking history. He has never used smokeless tobacco. He reports current alcohol use. He reports that he does not use drugs. family history includes Glaucoma in his brother and father; Heart disease in his mother; Hypertension in his mother; Melanoma in his father; Stroke in his mother. Allergies  Allergen Reactions   Aleve [Naproxen] Hives, Swelling and Other (See Comments)    Can take ibuprofen   Levaquin [Levofloxacin]     hives and GI upset   Percocet [Oxycodone-Acetaminophen]     Pt prefers not to take     Review of Systems  Constitutional:  Negative for chills and fever.  Skin:  Negative for rash.      Objective:     BP 134/78 (BP Location: Left Arm, Patient Position: Sitting, Cuff Size: Normal)   Pulse 75   Temp 98.5 F (36.9 C) (Oral)   Ht 5\' 6"  (1.676 m)   Wt 157 lb (71.2 kg)   SpO2 99%   BMI 25.34 kg/m  BP Readings from Last 3 Encounters:  09/13/22 134/78  08/29/22 (!) 146/70  04/06/22 (!) 153/84   Wt Readings from Last 3 Encounters:  09/13/22 157 lb (71.2 kg)  08/29/22 156 lb 6.4 oz (70.9 kg)  04/06/22 140 lb (63.5 kg)  Physical Exam Vitals reviewed.  Constitutional:      Appearance: Normal appearance.  Cardiovascular:     Rate and Rhythm: Normal rate and regular rhythm.  Skin:    Comments: Very subtle edema of both feet.  No erythema.  No visible bite marks.  No pustules.  No vesicles.  Neurological:     Mental Status: He is alert.      No results found for any visits on 09/13/22.    The 10-year ASCVD risk score (Arnett DK, et al., 2019) is: 18.3%    Assessment & Plan:   Recent insect bites both feet from some type of small black ant in [place].  Local allergic reaction.  No evidence for infection.  No generalized rash.  Continue frequent elevation.  Continue Benadryl as needed at night.  Follow-up as needed  [name], MD

## 2022-09-19 ENCOUNTER — Ambulatory Visit: Payer: No Typology Code available for payment source | Admitting: Behavioral Health

## 2022-09-19 DIAGNOSIS — F32A Depression, unspecified: Secondary | ICD-10-CM | POA: Diagnosis not present

## 2022-09-19 DIAGNOSIS — F419 Anxiety disorder, unspecified: Secondary | ICD-10-CM

## 2022-09-19 DIAGNOSIS — F4323 Adjustment disorder with mixed anxiety and depressed mood: Secondary | ICD-10-CM | POA: Diagnosis not present

## 2022-09-19 NOTE — Progress Notes (Signed)
                Dennis L Winstead, LMFT 

## 2022-09-23 NOTE — Progress Notes (Signed)
[facility] Counselor Initial Adult Exam  Name: Dennis Villarreal Date: 09/23/2022 MRN: [id number] DOB: Jul 08, 1950 PCP: Kristian Covey, MD  Time spent: 60 min In Person @ [facility]  Guardian/Payee:  [employer] [facility] [place]    Paperwork requested: No   Reason for Visit /Presenting Problem: Wife is requesting a divorce. She had brain surgery 2 yrs ago & has not been the same person since that time. She has treated Pt unkindly for 4 yrs.   Mental Status Exam: Appearance:   Casual     Behavior:  Appropriate and Sharing  Motor:  Normal  Speech/Language:   Clear and Coherent and Normal Rate  Affect:  Appropriate  Mood:  normal  Thought process:  normal  Thought content:    WNL  Sensory/Perceptual disturbances:    WNL  Orientation:  oriented to person, place, time/date, and situation  Attention:  Good  Concentration:  Good  Memory:  WNL  Fund of knowledge:   Good  Insight:    Good  Judgment:   Good  Impulse Control:  Good   Risk Assessment: Danger to Self:  No Self-injurious Behavior: No Danger to Others: No Duty to Warn:no Physical Aggression / Violence:No  Access to Firearms a concern: No  Gang Involvement:No  Patient / guardian was educated about steps to take if suicide or homicide risk level increases between visits: yes; appropriate to ICD process While future psychiatric events cannot be accurately predicted, the patient does not currently require acute inpatient psychiatric care and does not currently meet [facility] involuntary commitment criteria.  Substance Abuse History: Current substance abuse: No     Past Psychiatric History:   No previous psychological problems have been observed Outpatient Providers: [name], MD History of Psych Hospitalization: No  Psychological Testing:  NA    Abuse History:  Victim of: No.,  NA    Report needed: No. Victim of Neglect:No. Perpetrator of  NA   Witness / Exposure to Domestic  Violence: No   Protective Services Involvement: No  Witness to [employer] Violence:  No   Family History:  Family History  Problem Relation Age of Onset   Heart disease Mother        CVA and cardiac arrhythmia, ? atrial fib   Stroke Mother    Hypertension Mother    Melanoma Father    Glaucoma Father    Glaucoma Brother     Living situation: the patient lives with their spouse  Sexual Orientation: Straight  Relationship Status: married  Name of spouse / other: Lanora Manis ('Bet') If a parent, number of children / ages: 94yo Maralyn Sago who is a Dental Asst, 72yo Almira Coaster who is an Charity fundraiser @ [employer] in [place], [place] & 72yo [place]flat who lives in [place], [place] & is a Sr Stage manager for a [employer] fast streamer.  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 He collects Soc Sec & works PT for the [employer] on the [employer]. Wife Philis Nettle has her own Massage Business in which she does well. She does not share finances w/Tim. She collects Government social research officer.   Military Service: No   Educational History: Education: college graduate  Religion/Sprituality/World View: Unk  Any cultural differences that may affect / interfere with treatment:  None noted today  Recreation/Hobbies: Pt loves his PT job &  does limited activities in the home. He lives separated from Wife by first & second floor accommodations.   Stressors: Loss of affection, love & connection from Wife Philis Nettle he feels is emot'ly unstable since her brain surgery to remove a golf ball sized tumor. She has been hurtful to him since that time; April 2022. 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    Strengths: Supportive Relationships, Family, Self Advocate, Able to Communicate Effectively, and desire to save his marriage from divorce.   Barriers:  Wife resists joining him in Th  sessions until she has her own Therapy. This has occurred 4 times now. He is uncertain of her willingness to participate in Cpl Th.   Legal History: Pending legal issue / charges: The patient has no significant history of legal issues. History of legal issue / charges:  NA  Medical History/Surgical History: reviewed Past Medical History:  Diagnosis Date   ALLERGIC RHINITIS 09/04/2009   Basal cell carcinoma    Cancer (HCC) 2012   melanoma   DYSLIPIDEMIA 09/04/2009   GERD 09/04/2009   High cholesterol    just above borderline per patient    History of hiatal hernia    History of kidney stones    Pneumonia 2014   Hx    Past Surgical History:  Procedure Laterality Date   CHOLECYSTECTOMY     GALLBLADDER SURGERY  01/2018   INGUINAL HERNIA REPAIR Left 04/06/2022   Procedure: LAPAROSCOPIC LEFT INGUINAL HERNIA REPAIR WITH MESH;  Surgeon: Axel Filler, MD;  Location: [facility] OR;  Service: General;  Laterality: Left;   SHOULDER ARTHROSCOPY WITH OPEN ROTATOR CUFF REPAIR AND DISTAL CLAVICLE ACROMINECTOMY Left 04/13/2021   Procedure: LEFT SHOULDER ARTHROSCOPY, DEBRIDEMENT, MINI OPEN ROTATOR CUFF TEAR REPAIR, BICEPS TENODESIS;  Surgeon: Cammy Copa, MD;  Location: [facility] OR;  Service: Orthopedics;  Laterality: Left;   SHOULDER SURGERY  1986, 2009   left shoulder scromoplasty   TONSILLECTOMY     TRIGGER FINGER RELEASE Left     Medications: Current Outpatient Medications  Medication Sig Dispense Refill   atorvastatin (LIPITOR) 20 MG tablet TAKE ONE TABLET BY MOUTH DAILY 30 tablet 0   FLUoxetine (PROZAC) 40 MG capsule Take 1 capsule (40 mg total) by mouth daily. 90 capsule 0   pantoprazole (PROTONIX) 40 MG tablet Take 1 tablet (40 mg total) by mouth daily. 90 tablet 2   psyllium (METAMUCIL) 58.6 % powder Take 1 packet by mouth daily.     tadalafil (CIALIS) 5 MG tablet TAKE 1 TABLET BY MOUTH DAILY 30 tablet 1   tamsulosin (FLOMAX) 0.4 MG CAPS capsule TAKE 1 CAPSULE BY MOUTH DAILY **PLEASE  SCHEDULE APPOINTMENT FOR REFILLS** 90 capsule 0   traMADol (ULTRAM) 50 MG tablet Take 1 tablet (50 mg total) by mouth every 6 (six) hours as needed. 20 tablet 0   No current facility-administered medications for this visit.    Allergies  Allergen Reactions   Aleve [Naproxen] Hives, Swelling and Other (See Comments)    Can take ibuprofen   Levaquin [Levofloxacin]     hives and GI upset   Percocet [Oxycodone-Acetaminophen]     Pt prefers not to take     Diagnoses:  Anxiety and depression  Adjustment disorder with mixed anxiety and depressed mood  Plan of Care: [name] will attend all sessions as scheduled every 2-3 wks. He will keep a Notebook to record his feelings, thoughts, & takeways from our sessions in order to reinforce his progress &  growth & to feel empowered, to feel less like he has been used in his marital relationship, & to find his way into this territory of divorce w/an objective Professional.   Target Date: 10/24/2022  Progress: 4  Frequency: Once every 2-3 wks  Modality: Claretta Fraise, LMFT

## 2022-09-29 ENCOUNTER — Telehealth: Payer: Self-pay | Admitting: Family Medicine

## 2022-09-29 MED ORDER — PANTOPRAZOLE SODIUM 40 MG PO TBEC: 40.0000 mg | DELAYED_RELEASE_TABLET | Freq: Every day | ORAL | 0 refills | Status: DC

## 2022-09-29 NOTE — Telephone Encounter (Signed)
Rx done. 

## 2022-09-29 NOTE — Telephone Encounter (Signed)
Prescription Request  09/29/2022  LOV: 09/13/2022  What is the name of the medication or equipment?  pantoprazole (PROTONIX) 40 MG tablet  Have you contacted your pharmacy to request a refill? Yes   Which pharmacy would you like this sent to?  [facility] [id number] Ginette Otto, [place] - [address] [place] [address] Phone: [phone] Fax: [phone]    Patient notified that their request is being sent to the clinical staff for review and that they should receive a response within 2 business days.   Please advise at Mobile [phone] (mobile)

## 2022-10-04 DIAGNOSIS — Z6823 Body mass index (BMI) 23.0-23.9, adult: Secondary | ICD-10-CM | POA: Diagnosis not present

## 2022-10-04 DIAGNOSIS — F325 Major depressive disorder, single episode, in full remission: Secondary | ICD-10-CM | POA: Diagnosis not present

## 2022-10-04 DIAGNOSIS — Z008 Encounter for other general examination: Secondary | ICD-10-CM | POA: Diagnosis not present

## 2022-10-05 ENCOUNTER — Ambulatory Visit (INDEPENDENT_AMBULATORY_CARE_PROVIDER_SITE_OTHER): Payer: No Typology Code available for payment source | Admitting: Family Medicine

## 2022-10-05 ENCOUNTER — Encounter: Payer: Self-pay | Admitting: Family Medicine

## 2022-10-05 VITALS — BP 126/76 | HR 62 | Temp 97.8°F | Ht 66.0 in | Wt 160.1 lb

## 2022-10-05 DIAGNOSIS — Z23 Encounter for immunization: Secondary | ICD-10-CM

## 2022-10-05 DIAGNOSIS — T7840XD Allergy, unspecified, subsequent encounter: Secondary | ICD-10-CM

## 2022-10-05 DIAGNOSIS — E785 Hyperlipidemia, unspecified: Secondary | ICD-10-CM

## 2022-10-05 MED ORDER — ATORVASTATIN CALCIUM 20 MG PO TABS: ORAL_TABLET | ORAL | 3 refills | Status: DC

## 2022-10-05 MED ORDER — PREDNISONE 10 MG PO TABS: ORAL_TABLET | ORAL | 0 refills | Status: DC

## 2022-10-05 NOTE — Patient Instructions (Signed)
Set up labs for next Monday.

## 2022-10-05 NOTE — Progress Notes (Unsigned)
Established Patient Office Visit  Subjective   Patient ID: Dennis Villarreal, male    DOB: 01-22-1951  Age: 72 y.o. MRN: [id number]  Chief Complaint  Patient presents with   Medical Management of Chronic Issues    HPI  {History (Optional):23778} Dennis Villarreal is seen for the following items  Still having some intermittent stinging sensation and mild swelling both feet and ankles.  Refer to prior note.  He was [place] in [place] recently and had several bites from some type of black ant.  Has been taking some Benadryl.  No other systemic rash.  No dyspnea.  No orthopnea.  Does have history of hyperlipidemia and takes atorvastatin 20 mg daily.  Needs refills and also due for follow-up labs.  Nonfasting at this time.  He has history of GERD which is controlled with Protonix 40 mg daily.  History of depression currently stable on fluoxetine 40 mg daily.  Past Medical History:  Diagnosis Date   ALLERGIC RHINITIS 09/04/2009   Basal cell carcinoma    Cancer (HCC) 2012   melanoma   DYSLIPIDEMIA 09/04/2009   GERD 09/04/2009   High cholesterol    just above borderline per patient    History of hiatal hernia    History of kidney stones    Pneumonia 2014   Hx   Past Surgical History:  Procedure Laterality Date   CHOLECYSTECTOMY     GALLBLADDER SURGERY  01/2018   INGUINAL HERNIA REPAIR Left 04/06/2022   Procedure: LAPAROSCOPIC LEFT INGUINAL HERNIA REPAIR WITH MESH;  Surgeon: Axel Filler, MD;  Location: [facility] OR;  Service: General;  Laterality: Left;   SHOULDER ARTHROSCOPY WITH OPEN ROTATOR CUFF REPAIR AND DISTAL CLAVICLE ACROMINECTOMY Left 04/13/2021   Procedure: LEFT SHOULDER ARTHROSCOPY, DEBRIDEMENT, MINI OPEN ROTATOR CUFF TEAR REPAIR, BICEPS TENODESIS;  Surgeon: Cammy Copa, MD;  Location: [facility] OR;  Service: Orthopedics;  Laterality: Left;   SHOULDER SURGERY  1986, 2009   left shoulder scromoplasty   TONSILLECTOMY     TRIGGER FINGER RELEASE Left     reports that he quit  smoking about 40 years ago. His smoking use included cigarettes. He started smoking about 60 years ago. He has a 20 pack-year smoking history. He has never used smokeless tobacco. He reports current alcohol use. He reports that he does not use drugs. family history includes Glaucoma in his brother and father; Heart disease in his mother; Hypertension in his mother; Melanoma in his father; Stroke in his mother. Allergies  Allergen Reactions   Aleve [Naproxen] Hives, Swelling and Other (See Comments)    Can take ibuprofen   Levaquin [Levofloxacin]     hives and GI upset   Percocet [Oxycodone-Acetaminophen]     Pt prefers not to take     Review of Systems  Constitutional:  Negative for chills, fever and malaise/fatigue.  Eyes:  Negative for blurred vision.  Respiratory:  Negative for shortness of breath.   Cardiovascular:  Positive for leg swelling. Negative for chest pain.  Skin:  Negative for itching.  Neurological:  Negative for dizziness, weakness and headaches.      Objective:     BP 126/76 (BP Location: Left Arm, Patient Position: Sitting, Cuff Size: Normal)   Pulse 62   Temp 97.8 F (36.6 C) (Oral)   Ht 5\' 6"  (1.676 m)   Wt 160 lb 1.6 oz (72.6 kg)   SpO2 98%   BMI 25.84 kg/m  {Vitals History (Optional):23777}  Physical Exam Vitals reviewed.  Constitutional:  General: He is not in acute distress.    Appearance: Normal appearance. He is not ill-appearing.  Cardiovascular:     Rate and Rhythm: Normal rate and regular rhythm.  Pulmonary:     Effort: Pulmonary effort is normal.     Breath sounds: Normal breath sounds. No wheezing or rales.  Musculoskeletal:     Comments: Legs and ankles and feet reveal only very mild nonpitting edema.  No visible rash.  No hives.  No pustules.  No cellulitis changes.  Neurological:     Mental Status: He is alert.      No results found for any visits on 10/05/22.  {Labs (Optional):23779}  The 10-year ASCVD risk score  (Arnett DK, et al., 2019) is: 17.9%    Assessment & Plan:   #1 mild bilateral ankle and lower leg edema.  He is describing some intermittent "stinging "sensation following multiple bites from a type of ant down in [place].  No generalized systemic reactions.  Difficult to sort out whether his mild edema in mild dysesthesias are related.  Will try to taper prednisone.  Reviewed potential side effects.  Continue antihistamine  #2 hyperlipidemia.  Overdue for labs.  Future labs ordered with lipid panel and CMP.  Refill Lipitor for 1 year.  #3 GERD adequately controlled Protonix.  Continue Protonix 40 mg daily  Patient like to get flu vaccine.  This was given today   No follow-ups on file.    [name], MD

## 2022-10-10 ENCOUNTER — Other Ambulatory Visit (INDEPENDENT_AMBULATORY_CARE_PROVIDER_SITE_OTHER): Payer: No Typology Code available for payment source

## 2022-10-10 DIAGNOSIS — E785 Hyperlipidemia, unspecified: Secondary | ICD-10-CM

## 2022-10-10 LAB — COMPREHENSIVE METABOLIC PANEL
ALT: 19 U/L (ref 0–53)
AST: 19 U/L (ref 0–37)
Albumin: 4 g/dL (ref 3.5–5.2)
Alkaline Phosphatase: 65 U/L (ref 39–117)
BUN: 16 mg/dL (ref 6–23)
CO2: 28 meq/L (ref 19–32)
Calcium: 9.1 mg/dL (ref 8.4–10.5)
Chloride: 102 meq/L (ref 96–112)
Creatinine, Ser: 0.82 mg/dL (ref 0.40–1.50)
GFR: 88.04 mL/min (ref >60.00)
Glucose, Bld: 104 mg/dL — ABNORMAL HIGH (ref 70–99)
Potassium: 4.1 meq/L (ref 3.5–5.1)
Sodium: 137 meq/L (ref 135–145)
Total Bilirubin: 0.8 mg/dL (ref 0.2–1.2)
Total Protein: 6.8 g/dL (ref 6.0–8.3)

## 2022-10-10 LAB — LIPID PANEL
Cholesterol: 154 mg/dL (ref 0–200)
HDL: 56.9 mg/dL (ref >39.00)
LDL Cholesterol: 76 mg/dL (ref 0–99)
NonHDL: 96.72
Total CHOL/HDL Ratio: 3
Triglycerides: 106 mg/dL (ref 0.0–149.0)
VLDL: 21.2 mg/dL (ref 0.0–40.0)

## 2022-10-11 NOTE — Progress Notes (Signed)
Noted.  Dennis Covey MD [facility]

## 2022-10-18 ENCOUNTER — Ambulatory Visit: Payer: No Typology Code available for payment source | Admitting: Behavioral Health

## 2022-11-01 ENCOUNTER — Ambulatory Visit: Payer: No Typology Code available for payment source | Admitting: Behavioral Health

## 2022-11-01 NOTE — Progress Notes (Unsigned)
                Anastasya Jewell L Farryn Linares, LMFT 

## 2022-11-14 ENCOUNTER — Encounter: Payer: Self-pay | Admitting: Family Medicine

## 2022-11-14 ENCOUNTER — Ambulatory Visit (INDEPENDENT_AMBULATORY_CARE_PROVIDER_SITE_OTHER): Payer: No Typology Code available for payment source | Admitting: Family Medicine

## 2022-11-14 VITALS — BP 122/64 | HR 65 | Temp 98.3°F | Ht 66.0 in | Wt 157.6 lb

## 2022-11-14 DIAGNOSIS — M542 Cervicalgia: Secondary | ICD-10-CM

## 2022-11-14 DIAGNOSIS — R29898 Other symptoms and signs involving the musculoskeletal system: Secondary | ICD-10-CM

## 2022-11-14 NOTE — Progress Notes (Signed)
Established Patient Office Visit  Subjective   Patient ID: Dennis Villarreal, male    DOB: Aug 25, 1950  Age: 72 y.o. MRN: [id number]  Chief Complaint  Patient presents with   Jaw Pain    Patient complains of right sided jaw pain, x1 week, Tried Ibuprofen    Knee Pain    Patient complains of right knee pain    HPI   Dennis Villarreal is seen today for a couple of acute issues as follows  Chief complaint above list "jaw pain".  However, his pain is really more posterior occipital area on the right side.  He states over a week ago he was helping someone on a ladder when a large screw fell off.  He looked up and this hit him across the bridge of the nose.  He jerked his neck to the side and within a couple days had pain right lower occipital region.  He describes this as a dull ache and actually improved somewhat at this time.  No radiculitis symptoms.  No upper extremity numbness or weakness.  Has not noted any redness or swelling.  Took some ibuprofen with mild relief.  Denies any facial pain or TMJ pain.  Other issues right knee popping sensation recently.  This is more medial.  Did have some pain issues last March and ended up seeing sports medicine had echo and x-ray which showed mild degenerative changes medially.  Mild medial meniscus degeneration.  He had steroid injection at that time.  No recent swelling.  No warmth.  Past Medical History:  Diagnosis Date   ALLERGIC RHINITIS 09/04/2009   Basal cell carcinoma    Cancer (HCC) 2012   melanoma   DYSLIPIDEMIA 09/04/2009   GERD 09/04/2009   High cholesterol    just above borderline per patient    History of hiatal hernia    History of kidney stones    Pneumonia 2014   Hx   Past Surgical History:  Procedure Laterality Date   CHOLECYSTECTOMY     GALLBLADDER SURGERY  01/2018   INGUINAL HERNIA REPAIR Left 04/06/2022   Procedure: LAPAROSCOPIC LEFT INGUINAL HERNIA REPAIR WITH MESH;  Surgeon: Axel Filler, MD;  Location: [facility] OR;  Service:  General;  Laterality: Left;   SHOULDER ARTHROSCOPY WITH OPEN ROTATOR CUFF REPAIR AND DISTAL CLAVICLE ACROMINECTOMY Left 04/13/2021   Procedure: LEFT SHOULDER ARTHROSCOPY, DEBRIDEMENT, MINI OPEN ROTATOR CUFF TEAR REPAIR, BICEPS TENODESIS;  Surgeon: Cammy Copa, MD;  Location: [facility] OR;  Service: Orthopedics;  Laterality: Left;   SHOULDER SURGERY  1986, 2009   left shoulder scromoplasty   TONSILLECTOMY     TRIGGER FINGER RELEASE Left     reports that he quit smoking about 40 years ago. His smoking use included cigarettes. He started smoking about 60 years ago. He has a 20 pack-year smoking history. He has never used smokeless tobacco. He reports current alcohol use. He reports that he does not use drugs. family history includes Glaucoma in his brother and father; Heart disease in his mother; Hypertension in his mother; Melanoma in his father; Stroke in his mother. Allergies  Allergen Reactions   Aleve [Naproxen] Hives, Swelling and Other (See Comments)    Can take ibuprofen   Levaquin [Levofloxacin]     hives and GI upset   Percocet [Oxycodone-Acetaminophen]     Pt prefers not to take     Review of Systems  Constitutional:  Negative for chills and fever.  HENT:  Negative for sore throat.   Eyes:  Negative for blurred vision.  Cardiovascular:  Negative for chest pain.  Musculoskeletal:  Negative for neck pain.  Neurological:  Negative for focal weakness and headaches.      Objective:     BP 122/64 (BP Location: Left Arm, Patient Position: Sitting, Cuff Size: Normal)   Pulse 65   Temp 98.3 F (36.8 C) (Oral)   Ht 5\' 6"  (1.676 m)   Wt 157 lb 9.6 oz (71.5 kg)   SpO2 98%   BMI 25.44 kg/m  BP Readings from Last 3 Encounters:  11/14/22 122/64  10/05/22 126/76  09/13/22 134/78   Wt Readings from Last 3 Encounters:  11/14/22 157 lb 9.6 oz (71.5 kg)  10/05/22 160 lb 1.6 oz (72.6 kg)  09/13/22 157 lb (71.2 kg)      Physical Exam Vitals reviewed.  Constitutional:       General: He is not in acute distress.    Appearance: Normal appearance. He is not toxic-appearing.  Neck:     Comments: Mild/minimal tenderness near the attachment of the paracervical muscles to the occiput on the right side.  No visible swelling.  No erythema.  No neck adenopathy noted. Musculoskeletal:     Cervical back: Neck supple.     Comments: Right knee reveals full range of motion.  No effusion.  No warmth.  Mild medial joint line tenderness.  No lateral tenderness.  Ligament testing is normal.  Lymphadenopathy:     Cervical: No cervical adenopathy.  Neurological:     Mental Status: He is alert.      No results found for any visits on 11/14/22.    The 10-year ASCVD risk score (Arnett DK, et al., 2019) is: 16.3%    Assessment & Plan:   #1 right posterior neck pain.  Suspect muscular.  Symptoms actually improving somewhat.  Continue topical heat and consider topical sports cream along with some gentle massage.  Touch base if not continuing to resolve next couple weeks  #2 intermittent right knee pain.  Only mild degenerative changes noted on x-ray last March.  May have right medial meniscal irritation.  Avoid squatting as much as possible.  Symptoms are mild currently and recommend observation for now  [name], MD

## 2022-11-21 ENCOUNTER — Ambulatory Visit: Payer: No Typology Code available for payment source | Admitting: Behavioral Health

## 2022-11-23 ENCOUNTER — Other Ambulatory Visit: Payer: Self-pay | Admitting: Family Medicine

## 2022-12-06 ENCOUNTER — Ambulatory Visit: Payer: No Typology Code available for payment source | Admitting: Behavioral Health

## 2022-12-06 DIAGNOSIS — F419 Anxiety disorder, unspecified: Secondary | ICD-10-CM | POA: Diagnosis not present

## 2022-12-06 DIAGNOSIS — F32A Depression, unspecified: Secondary | ICD-10-CM

## 2022-12-06 DIAGNOSIS — F4323 Adjustment disorder with mixed anxiety and depressed mood: Secondary | ICD-10-CM

## 2022-12-06 NOTE — Progress Notes (Signed)
                Anastasya Jewell L Farryn Linares, LMFT 

## 2022-12-06 NOTE — Progress Notes (Signed)
[facility] Counselor/Therapist Progress Note  Patient ID: Dennis Villarreal, MRN: [id number],    Date: 12/06/2022  Time Spent: 55 min Caregility video; Pt is home in private & Provider is working remotely from [facility] [employer] [facility]. Pt is aware of the risks/limitations of telehealth & consents to Tx today.  Time In: 9:00am Time Out: 9:55am  Treatment Type: Individual Therapy  Reported Symptoms: Elevated anx/dep & stress due to marital relationship health status since Dennis Villarreal had brain cancer surgery in April 2022. Marriage has been on tenuous ground since that time.  Mental Status Exam: Appearance:  Casual     Behavior: Appropriate and Sharing  Motor: Normal  Speech/Language:  Clear and Coherent and Normal Rate  Affect: Appropriate  Mood: normal  Thought process: normal  Thought content:   WNL  Sensory/Perceptual disturbances:   WNL  Orientation: oriented to person, place, time/date, and situation  Attention: Good  Concentration: Good  Memory: WNL  Fund of knowledge:  Good  Insight:   Good  Judgment:  Good  Impulse Control: Good   Risk Assessment: Danger to Self:  No Self-injurious Behavior: No Danger to Others: No Duty to Warn:no Physical Aggression / Violence:No  Access to Firearms a concern: No  Gang Involvement:No   Subjective: Dennis Villarreal is more clear today about his marriage & what he wants @ this point in time. He & Dennis have spoken recently & he gained clarity from this discussion. Neither Spouse is seeing anyone. Pt wants more for his life after the 2 & 1/2 yrs he has been living in solitude @ home. Dennis often tries to hear Dennis Villarreal now & affirms his comments slightly. This is different than a few months ago.   Dennis has requested Dennis Villarreal ride in the car tgthr to visit [place] where her Dennis Villarreal & Husb Dennis Villarreal & their Dennis Villarreal Son are living. All 3 Adult Children travelled w/Dennis to the [place] to visit her extended Family. This has made her very happy.    Dennis is slowly making some progress & seems to engage Pt more now.   Interventions: Psycho-education/Bibliotherapy, Insight-Oriented, and Family Systems  Diagnosis:Anxiety and depression  Adjustment disorder with mixed anxiety and depressed mood  Plan: Dennis Villarreal has witnessed some improvements, however slight in his relationship w/his Dennis in the past few months. He is still unhappy w/the situation & how much his Dennis has changed in personality, caring, & love for him. He has begun a chat relationship w/a woman in [place], [place]. They have discussed the possibility of him visiting in the past few days. Dennis Villarreal will spend more time considering the logistics, reality, & safety of this trip & we will speak about this in Dec to f/u.  Target Date: 01/23/2023  Progress: 5  Frequency: Once monthly  Modality: Claretta Fraise, LMFT

## 2022-12-23 ENCOUNTER — Other Ambulatory Visit: Payer: Self-pay | Admitting: Family Medicine

## 2022-12-26 ENCOUNTER — Other Ambulatory Visit: Payer: Self-pay

## 2022-12-26 ENCOUNTER — Other Ambulatory Visit: Payer: Self-pay | Admitting: Family Medicine

## 2022-12-26 NOTE — Telephone Encounter (Signed)
I received Triage note for patient in regards to having refill for Gabapentin and patient reported he was having severe aching and pain in groin area. I spoke with the patient and he declined appt at this time and stated that the following issues were previously dicussed with PCP and he is just looking to have Gabapentin refilled.

## 2022-12-27 ENCOUNTER — Encounter: Payer: Self-pay | Admitting: Family Medicine

## 2022-12-27 ENCOUNTER — Ambulatory Visit (INDEPENDENT_AMBULATORY_CARE_PROVIDER_SITE_OTHER): Payer: No Typology Code available for payment source | Admitting: Family Medicine

## 2022-12-27 VITALS — BP 126/76 | HR 65 | Temp 98.3°F | Ht 66.0 in | Wt 159.2 lb

## 2022-12-27 DIAGNOSIS — M79604 Pain in right leg: Secondary | ICD-10-CM | POA: Diagnosis not present

## 2022-12-27 DIAGNOSIS — M79605 Pain in left leg: Secondary | ICD-10-CM

## 2022-12-27 DIAGNOSIS — N401 Enlarged prostate with lower urinary tract symptoms: Secondary | ICD-10-CM

## 2022-12-27 DIAGNOSIS — E785 Hyperlipidemia, unspecified: Secondary | ICD-10-CM | POA: Diagnosis not present

## 2022-12-27 MED ORDER — GABAPENTIN 100 MG PO CAPS: ORAL_CAPSULE | ORAL | 0 refills | Status: AC

## 2022-12-27 NOTE — Patient Instructions (Signed)
Start back the Gabapentin and give me some feedback in couple of weeks if leg pain not improved  Follow up immediately for any progressive weakness or pain.

## 2022-12-27 NOTE — Telephone Encounter (Signed)
Rx sent 

## 2022-12-27 NOTE — Progress Notes (Signed)
Established Patient Office Visit  Subjective   Patient ID: Dennis Villarreal, male    DOB: Jun 04, 1950  Age: 72 y.o. MRN: [id number]  Chief Complaint  Patient presents with   Groin Pain        Leg Pain    HPI   Tim and called yesterday with recurrence of what he has been more or less a chronic intermittent problem which is achy pain lower extremities left greater than right mostly posterior thigh region.  He had previously taken gabapentin and had called for refill.  Denies recent injury.  Denies lumbar back pain.  Pain is aching quality and varies in intensity.  Sometimes radiates toward the testicle region.  No weakness.  No urine or stool incontinence.  He had MRI scan 2022 which showed mild for age lumbar spine degeneration with no significant stenosis.  Did have some disc bulging on the left L2 bilateral L3 and bilateral L4 nerve levels.  He continues to deal with tremendous stress at home.  His wife is still living at home but they are basically estranged within the same house.  [name] is currently in counseling and is try to get his wife to go for dual counseling but she has declined  He has history of BPH.  Takes Flomax 0.4 mg once daily.  Still has occasional slow stream but up no progressive urinary symptoms. Lipidemia treated with Lipitor.  Recent lipids total cholesterol 154 with LDL 76 and HDL 56  Past Medical History:  Diagnosis Date   ALLERGIC RHINITIS 09/04/2009   Basal cell carcinoma    Cancer (HCC) 2012   melanoma   DYSLIPIDEMIA 09/04/2009   GERD 09/04/2009   High cholesterol    just above borderline per patient    History of hiatal hernia    History of kidney stones    Pneumonia 2014   Hx   Past Surgical History:  Procedure Laterality Date   CHOLECYSTECTOMY     GALLBLADDER SURGERY  01/2018   INGUINAL HERNIA REPAIR Left 04/06/2022   Procedure: LAPAROSCOPIC LEFT INGUINAL HERNIA REPAIR WITH MESH;  Surgeon: Axel Filler, MD;  Location: [facility] OR;  Service:  General;  Laterality: Left;   SHOULDER ARTHROSCOPY WITH OPEN ROTATOR CUFF REPAIR AND DISTAL CLAVICLE ACROMINECTOMY Left 04/13/2021   Procedure: LEFT SHOULDER ARTHROSCOPY, DEBRIDEMENT, MINI OPEN ROTATOR CUFF TEAR REPAIR, BICEPS TENODESIS;  Surgeon: Cammy Copa, MD;  Location: [facility] OR;  Service: Orthopedics;  Laterality: Left;   SHOULDER SURGERY  1986, 2009   left shoulder scromoplasty   TONSILLECTOMY     TRIGGER FINGER RELEASE Left     reports that he quit smoking about 40 years ago. His smoking use included cigarettes. He started smoking about 60 years ago. He has a 20 pack-year smoking history. He has never used smokeless tobacco. He reports current alcohol use. He reports that he does not use drugs. family history includes Glaucoma in his brother and father; Heart disease in his mother; Hypertension in his mother; Melanoma in his father; Stroke in his mother. Allergies  Allergen Reactions   Aleve [Naproxen] Hives, Swelling and Other (See Comments)    Can take ibuprofen   Levaquin [Levofloxacin]     hives and GI upset   Percocet [Oxycodone-Acetaminophen]     Pt prefers not to take     Review of Systems  Constitutional:  Negative for malaise/fatigue.  Eyes:  Negative for blurred vision.  Respiratory:  Negative for shortness of breath.   Cardiovascular:  Negative for  chest pain.  Musculoskeletal:  Positive for myalgias. Negative for back pain.  Neurological:  Negative for dizziness, weakness and headaches.      Objective:     BP 126/76 (BP Location: Left Arm, Patient Position: Sitting, Cuff Size: Normal)   Pulse 65   Temp 98.3 F (36.8 C) (Oral)   Ht 5\' 6"  (1.676 m)   Wt 159 lb 3.2 oz (72.2 kg)   SpO2 100%   BMI 25.70 kg/m  BP Readings from Last 3 Encounters:  12/27/22 126/76  11/14/22 122/64  10/05/22 126/76   Wt Readings from Last 3 Encounters:  12/27/22 159 lb 3.2 oz (72.2 kg)  11/14/22 157 lb 9.6 oz (71.5 kg)  10/05/22 160 lb 1.6 oz (72.6 kg)       Physical Exam Constitutional:      Appearance: He is well-developed.  Eyes:     Pupils: Pupils are equal, round, and reactive to light.  Neck:     Thyroid: No thyromegaly.  Cardiovascular:     Rate and Rhythm: Normal rate and regular rhythm.  Pulmonary:     Effort: Pulmonary effort is normal. No respiratory distress.     Breath sounds: Normal breath sounds. No wheezing or rales.  Musculoskeletal:     Cervical back: Neck supple.     Comments: Straight leg raises are negative bilaterally.  No spinal tenderness.  Neurological:     Mental Status: He is alert and oriented to person, place, and time.     Comments: Full strength with plantarflexion, dorsiflexion, knee extension, hip flexion bilaterally.  Deep tendon reflexes 2+ knee and 1+ ankle bilaterally      No results found for any visits on 12/27/22.  Last CBC Lab Results  Component Value Date   WBC 8.4 04/04/2022   HGB 14.8 04/04/2022   HCT 42.6 04/04/2022   MCV 93.0 04/04/2022   MCH 32.3 04/04/2022   RDW 11.8 04/04/2022   PLT 184 04/04/2022   Last metabolic panel Lab Results  Component Value Date   GLUCOSE 104 (H) 10/10/2022   NA 137 10/10/2022   K 4.1 10/10/2022   CL 102 10/10/2022   CO2 28 10/10/2022   BUN 16 10/10/2022   CREATININE 0.82 10/10/2022   GFR 88.04 10/10/2022   CALCIUM 9.1 10/10/2022   PROT 6.8 10/10/2022   ALBUMIN 4.0 10/10/2022   LABGLOB 2.4 03/18/2014   AGRATIO 1.8 03/18/2014   BILITOT 0.8 10/10/2022   ALKPHOS 65 10/10/2022   AST 19 10/10/2022   ALT 19 10/10/2022   ANIONGAP 7 04/09/2021   Last lipids Lab Results  Component Value Date   CHOL 154 10/10/2022   HDL 56.90 10/10/2022   LDLCALC 76 10/10/2022   LDLDIRECT 175.6 04/07/2010   TRIG 106.0 10/10/2022   CHOLHDL 3 10/10/2022      The 78-GNFA ASCVD risk score (Arnett DK, et al., 2019) is: 17.2%    Assessment & Plan:   #1 bilateral lower extremity pain.  He describes achy pain mostly left posterior thigh region.  Sounds  more likely radicular in nature.  Does not describe any claudication type symptoms.  Symptoms very intermittent and doubt related to statin .   Has responded previously favorably to gabapentin.  Refill sent this morning.  Give this 2 weeks on gabapentin and titrate up slowly as directed.  If not improved after that time be in touch to consider either further imaging or possibly referral to back specialist  #2 hyperlipidemia treated with atorvastatin 20 mg daily.  Recent lipids reviewed and stable.  Continue current dosage  #3 BPH stable on Flomax 0.4 mg nightly.  Continue current dosage.  Avoid anticholinergic medications  [name], MD

## 2023-01-02 ENCOUNTER — Ambulatory Visit (INDEPENDENT_AMBULATORY_CARE_PROVIDER_SITE_OTHER): Payer: PRIVATE HEALTH INSURANCE | Admitting: Behavioral Health

## 2023-01-02 ENCOUNTER — Other Ambulatory Visit: Payer: Self-pay | Admitting: Family Medicine

## 2023-01-02 DIAGNOSIS — F4323 Adjustment disorder with mixed anxiety and depressed mood: Secondary | ICD-10-CM

## 2023-01-02 DIAGNOSIS — F419 Anxiety disorder, unspecified: Secondary | ICD-10-CM | POA: Diagnosis not present

## 2023-01-02 DIAGNOSIS — Z630 Problems in relationship with spouse or partner: Secondary | ICD-10-CM | POA: Diagnosis not present

## 2023-01-02 DIAGNOSIS — F32A Depression, unspecified: Secondary | ICD-10-CM | POA: Diagnosis not present

## 2023-01-02 NOTE — Progress Notes (Signed)
[facility] Counselor/Therapist Progress Note  Patient ID: ARAV SCROGGIN, MRN: [id number],    Date: 01/02/2023  Time Spent: 55 min Caregility video; Pt is home in private & Provider is working remotely from [facility] [employer] [facility]. Pt is aware of the risks/limitations of telehealth & consents to Tx today. Pt changed appt to virtual this morning. Time In: 9:00am Time Out: 9:55am   Treatment Type: Individual Therapy  Reported Symptoms: Elevated anx/dep & stress due to the continuing marital conflict  Mental Status Exam: Appearance:  Casual and Neat     Behavior: Appropriate and Sharing  Motor: Normal  Speech/Language:  Clear and Coherent  Affect: Appropriate  Mood: depressed  Thought process: normal  Thought content:   Rumination  Sensory/Perceptual disturbances:   WNL  Orientation: oriented to person, place, time/date, and situation  Attention: Good  Concentration: Good  Memory: WNL  Fund of knowledge:  Good  Insight:   Good  Judgment:  Good  Impulse Control: Good   Risk Assessment: Danger to Self:  No Self-injurious Behavior: No Danger to Others: No Duty to Warn:no Physical Aggression / Violence:No  Access to Firearms a concern: No  Gang Involvement:No   Subjective: Pt is becoming complacent about the state of his marriage. It is not changing & he describes a heated argument w/his Wife last week that ended in a stalemate. Wife has told Tim she will not pay any bills & wants him to move out. He refuses to do this over the Holidays & does not think it is possible for him to manage this situation & live separately.   Pt has consulted an Atty for some guidance w/his Wife in a Mediation session. This did not end well. Pt has decided he will split the marital assets if that is what is needed. Wife is unwilling to pool financial resources for the bills in the home.    Interventions: Psycho-education/Bibliotherapy and Family Systems  Diagnosis:Anxiety and  depression  Marital conflict  Plan: [name] will explore the Support Grp options for his situation being estranged from his Wife. He will contact his PCP Dr. [name], MD & request a re-initiation of his Prozac script that has helped his anx/dep Sx in the past. He will reach out to his Brothers in [place] for more support & try to visit & see them over the Holidays.  Target Date: 02/08/2023  Progress: 6  Frequency: Once every 2-3 wks  Modality: Claretta Fraise, LMFT

## 2023-01-02 NOTE — Progress Notes (Signed)
                Dennis Jewell L Farryn Linares, LMFT 

## 2023-01-10 ENCOUNTER — Encounter (INDEPENDENT_AMBULATORY_CARE_PROVIDER_SITE_OTHER): Payer: No Typology Code available for payment source | Admitting: Family Medicine

## 2023-01-10 NOTE — Progress Notes (Signed)
error 

## 2023-01-30 ENCOUNTER — Ambulatory Visit: Payer: PRIVATE HEALTH INSURANCE | Admitting: Behavioral Health

## 2023-01-30 DIAGNOSIS — F32A Depression, unspecified: Secondary | ICD-10-CM

## 2023-01-30 DIAGNOSIS — F419 Anxiety disorder, unspecified: Secondary | ICD-10-CM | POA: Diagnosis not present

## 2023-01-30 DIAGNOSIS — Z630 Problems in relationship with spouse or partner: Secondary | ICD-10-CM

## 2023-01-30 NOTE — Progress Notes (Signed)
   Deneise Lever, LMFT

## 2023-01-30 NOTE — Progress Notes (Addendum)
 [facility] Counselor/Therapist Progress Note  Patient ID: DEMETRIOS BYRON, MRN: [id number],    Date: 01/30/2023  Time Spent: 55 min Caregility video; Pt is home in private & Provider working remotely from [facility]. Pt is aware of risks/limitations of telehealth & consents to Tx today.  Time In: 11:00am Time Out: 11:55am   Treatment Type: Individual Therapy  Reported Symptoms: Reduction in anx/dep & marital stress over the past few wks  Mental Status Exam: Appearance:  Casual     Behavior: Appropriate and Sharing  Motor: Normal  Speech/Language:  Clear and Coherent  Affect: Appropriate  Mood: normal  Thought process: normal  Thought content:   WNL  Sensory/Perceptual disturbances:   WNL  Orientation: oriented to person, place, time/date, and situation  Attention: Good  Concentration: Good  Memory: WNL  Fund of knowledge:  Good  Insight:   Good  Judgment:  Good  Impulse Control: Good   Risk Assessment: Danger to Self:  No Self-injurious Behavior: No Danger to Others: No Duty to Warn:no Physical Aggression / Violence:No  Access to Firearms a concern: No  Gang Involvement:No   Subjective: Pt is exp'g less anxiety over the past few wks. The calm btwn him & Wife has been helpful. He tries to approach ea day w/a positive attitude & his older Terresa is very supportive.   Pt re-initiated his Prozac  in the past 2 wks. It is helping him feel much improved.   Interventions: Psycho-education/Bibliotherapy and Family Systems  Diagnosis:Anxiety and depression  Marital conflict  Plan: Velinda is currently satisfied w/his marital situation. He & Wife have not argued over the past few wks. He has ire-initiated his Prozac  script & required explicit directions today on how to make this happen for himself. He will call the Prescriber once our visit is concluded to start this process.   Target Date: 02/23/2023  Progress: 5  Frequency: Once every 2-3 wks  Modality:  Kennis  Next visit we will discuss Pt's progress w/the skills in, Difficult Conversations by Bethena Dayhoff, & Heen.  Richerd LITTIE Ling, LMFT

## 2023-02-06 ENCOUNTER — Ambulatory Visit: Payer: No Typology Code available for payment source | Admitting: Family Medicine

## 2023-02-20 ENCOUNTER — Ambulatory Visit (INDEPENDENT_AMBULATORY_CARE_PROVIDER_SITE_OTHER): Payer: PRIVATE HEALTH INSURANCE | Admitting: Behavioral Health

## 2023-02-20 DIAGNOSIS — F32A Depression, unspecified: Secondary | ICD-10-CM | POA: Diagnosis not present

## 2023-02-20 DIAGNOSIS — Z630 Problems in relationship with spouse or partner: Secondary | ICD-10-CM

## 2023-02-20 DIAGNOSIS — F4323 Adjustment disorder with mixed anxiety and depressed mood: Secondary | ICD-10-CM | POA: Diagnosis not present

## 2023-02-20 DIAGNOSIS — F419 Anxiety disorder, unspecified: Secondary | ICD-10-CM | POA: Diagnosis not present

## 2023-02-20 NOTE — Progress Notes (Signed)
[facility] Counselor/Therapist Progress Note  Patient ID: Dennis Villarreal, MRN: [id number],    Date: 02/20/2023  Time Spent: 55 min Caregility video; Pt is home in private & Provider is working remotely from [facility] [employer]. Pt is aware of the risks/limitations of telehealth & consents to Tx today.  Time In: 10:00am Time Out: 10:55am  Treatment Type: Individual Therapy  Reported Symptoms: Pt has discontinued his Prozac prescription. He does not think he needs to be medicated to help him cope.   Mental Status Exam: Appearance:  Casual     Behavior: Appropriate and Sharing  Motor: Normal  Speech/Language:  Clear and Coherent  Affect: Appropriate  Mood: normal  Thought process: normal  Thought content:   WNL  Sensory/Perceptual disturbances:   WNL  Orientation: oriented to person, place, time/date, and situation  Attention: Good  Concentration: Good  Memory: WNL  Fund of knowledge:  Good  Insight:   Good  Judgment:  Good  Impulse Control: Good   Risk Assessment: Danger to Self:  No Self-injurious Behavior: No Danger to Others: No Duty to Warn:no Physical Aggression / Violence:No  Access to Firearms a concern: No  Gang Involvement:No   Subjective: Pt is determining for himself how to proceed in his marital relationship. He is distancing himself emot'ly for his own sake. He is trying to limit his sharing w/his Wife bc she gets irritated & will refuse to speak w/him. Pt's Wife presents no interest in exploring their relationship.   Pt is in disagreement of the marriage becoming a roommate situation. He has been married for 56yrs & cannot see his Wife in this light. He wants to avoid the cost of an Atty, but Wife is not going to engage in this conversation. They are in a stalemate re: the financial information. He has informed his Wife he is no longer paying her Car Ins.  Interventions: Psycho-education/Bibliotherapy and  Insight-Oriented  Diagnosis:Adjustment disorder with mixed anxiety and depressed mood  Marital conflict  Anxiety and depression  Plan: Dennis Villarreal will secure a copy of 'Emotional Intelligence' by Kristen Cardinal. He has decided to "wait it out". His Wife is not emot'ly connected to him currently. He will purchase a Notebook to put entries into btwn sessions so he can record his concerns & not forget them.  Target Date: 2/30/2025  Progress: 6  Frequency: Once monthly  Modality: Claretta Fraise, LMFT

## 2023-02-20 NOTE — Progress Notes (Signed)
Dennis Lever, LMFT

## 2023-02-23 ENCOUNTER — Encounter: Payer: Self-pay | Admitting: Family Medicine

## 2023-02-23 ENCOUNTER — Ambulatory Visit: Payer: No Typology Code available for payment source | Admitting: Family Medicine

## 2023-02-23 DIAGNOSIS — Z0000 Encounter for general adult medical examination without abnormal findings: Secondary | ICD-10-CM

## 2023-02-23 NOTE — Patient Instructions (Signed)
I really enjoyed getting to talk with you today! I am available on Tuesdays and Thursdays for virtual visits if you have any questions or concerns, or if I can be of any further assistance.   CHECKLIST FROM ANNUAL WELLNESS VISIT:  -Follow up (please call to schedule if not scheduled after visit):   -yearly for annual wellness visit with primary care office  Here is a list of your preventive care/health maintenance measures and the plan for each if any are due:  PLAN For any measures below that may be due:  -can get the covid vaccine at the pharmacy  Health Maintenance  Topic Date Due   COVID-19 Vaccine (5 - 2024-25 season) 09/25/2022   Medicare Annual Wellness (AWV)  02/23/2024   DTaP/Tdap/Td (2 - Td or Tdap) 04/01/2024   Colonoscopy  11/30/2031   Pneumonia Vaccine 5+ Years old  Completed   INFLUENZA VACCINE  Completed   Hepatitis C Screening  Completed   Zoster Vaccines- Shingrix  Completed   HPV VACCINES  Aged Out    -See a dentist at least yearly  -Get your eyes checked and then per your eye specialist's recommendations  -Other issues addressed today:   -I have included below further information regarding a healthy whole foods based diet, physical activity guidelines for adults, stress management and opportunities for social connections. I hope you find this information useful.   -----------------------------------------------------------------------------------------------------------------------------------------------------------------------------------------------------------------------------------------------------------    NUTRITION: -eat real food: lots of colorful vegetables (half the plate) and fruits -5-7 servings of vegetables and fruits per day (fresh or steamed is best), exp. 2 servings of vegetables with lunch and dinner and 2 servings of fruit per day. Berries and greens such as kale and collards are great choices.  -consume on a regular basis:  fresh  fruits, fresh veggies, fish, nuts, seeds, healthy oils (such as olive oil, avocado oil), whole grains (make sure first ingredient on label contains the word "whole"), -can eat small amounts of dairy and lean meat (no larger than the palm of your hand), but avoid processed meats such as ham, bacon, lunch meat, etc. -drink water -try to avoid fast food and pre-packaged foods, processed meat, ultra processed foods (donuts, candy, etc.) -most experts advise limiting sodium to < 2300mg  per day, should limit further is any chronic conditions such as high blood pressure, heart disease, diabetes, etc. The American Heart Association advised that < 1500mg  is is ideal -try to avoid foods that contain any ingredients with names you do not recognize  -try to avoid foods with added sugar or sweeteners/sweets  -try to avoid sweet drinks -try to avoid white rice, white bread, pasta (unless whole grain)  EXERCISE GUIDELINES FOR ADULTS: -if you wish to increase your physical activity, do so gradually and with the approval of your doctor -STOP and seek medical care immediately if you have any chest pain, chest discomfort or trouble breathing when starting or increasing exercise  -move and stretch your body, legs, feet and arms when sitting for long periods -Physical activity guidelines for optimal health in adults: -get at least 150 minutes per week of moderate exercise (can talk, but not sing); this is about 20-30 minutes of sustained activity 5-7 days per week or two 10-15 minute episodes of sustained activity 5-7 days per week -do some muscle building/resistance training at least 2 days per week  -balance exercises 3+ days per week:   Stand somewhere where you have something sturdy to hold onto if you lose balance.  1) lift up on toes, start with 5x per day and work up to 20x   2) stand and lift on leg straight out to the side so that foot is a few inches of the floor, start with 5x each side and work up to  20x each side   3) stand on one foot, start with 5 seconds each side and work up to 20 seconds on each side  If you need ideas or help with getting more active:  -Silver sneakers https://tools.silversneakers.com  -Walk with a Doc: http://www.duncan-[email or web address]/  -try to include resistance (weight lifting/strength building) and balance exercises twice per week: or the following link for ideas: http://castillo-powell.com/  BuyDucts.dk  STRESS MANAGEMENT: -can try meditating, or just sitting quietly with deep breathing while intentionally relaxing all parts of your body for 5 minutes daily -if you need further help with stress, anxiety or depression please follow up with your primary doctor or contact the wonderful folks at [facility]: [phone]  SOCIAL CONNECTIONS: -options in [place] if you wish to engage in more social and exercise related activities:  -Silver sneakers https://tools.silversneakers.com  -Walk with a Doc: http://www.duncan-[email or web address]/  -Check out the [facility] Active Adults 50+ section on the [place] [employer] (hiking clubs, book clubs, cards and games, chess, exercise classes, aquatic classes and much more) - see the website for details: https://www.[place].gov/departments/parks-recreation/active-adults50  -YouTube has lots of exercise videos for different ages and abilities as well  -[facility] (a variety of indoor and outdoor inperson activities for adults). [phone]. [address]  -Virtual Online Classes (a variety of topics): see seniorplanet.org or call [phone]  -consider volunteering at a school, hospice center, church, senior center or elsewhere    ADVANCED HEALTHCARE DIRECTIVES:  [place] Advanced Directives  assistance:   ExpressWeek.com.cy  Everyone should have advanced health care directives in place. This is so that you get the care you want, should you ever be in a situation where you are unable to make your own medical decisions.   From the [place] Advanced Directive Website: "Advance Health Care Directives are legal documents in which you give written instructions about your health care if, in the future, you cannot speak for yourself.   A health care power of attorney allows you to name a person you trust to make your health care decisions if you cannot make them yourself. A declaration of a desire for a natural death (or living will) is document, which states that you desire not to have your life prolonged by extraordinary measures if you have a terminal or incurable illness or if you are in a vegetative state. An advance instruction for mental health treatment makes a declaration of instructions, information and preferences regarding your mental health treatment. It also states that you are aware that the advance instruction authorizes a mental health treatment provider to act according to your wishes. It may also outline your consent or refusal of mental health treatment. A declaration of an anatomical gift allows anyone over the age of 30 to make a gift by will, organ donor card or other document."   Please see the following website or an elder law attorney for forms, FAQs and for completion of advanced directives: [place] Health Care Directives Advance Health Care Directives (http://guzman.[email or web address]/)  Or copy and paste the following to your web browser: PoshChat.fi

## 2023-02-23 NOTE — Progress Notes (Signed)
PATIENT CHECK-IN and HEALTH RISK ASSESSMENT QUESTIONNAIRE:  -completed by phone/video for upcoming Medicare Preventive Visit  Pre-Visit Check-in: 1)Vitals (height, wt, BP, etc) - record in vitals section for visit on day of visit Request home vitals (wt, BP, etc.) and enter into vitals, THEN update Vital Signs SmartPhrase below at the top of the HPI. See below.  2)Review and Update Medications, Allergies PMH, Surgeries, Social history in Epic 3)Hospitalizations in the last year with date/reason? No  4)Review and Update Care Team (patient's specialists) in Epic 5) Complete PHQ9 in Epic  6) Complete Fall Screening in Epic 7)Review all Health Maintenance Due and order under PCP if not done.  Medicare Wellness Patient Questionnaire:  Answer theses question about your habits: How often do you have a drink containing alcohol? 4-5 days a week 1 week How often do you have six or more drinks on one occasion? never Have you ever smoked?Yes Quit date if applicable? 40 years ago  How many packs a day do/did you smoke? <1 Do you use smokeless tobacco? No Do you use an illicit drugs? No On average, how many days per week do you engage in moderate to strenuous exercise (like a brisk walk)? Reports get some exercise - has shoulder exercise - and doesn't get enough exercise On average, how many minutes do you engage in exercise at this level? At work walks 3-4 miles, does 30-60 minutes a few days week Are you sexually active? No Typical breakfast: Varies  Typical lunch: Varies  Typical dinner: Varies  Typical snacks: Cheese and crackers   Beverages: Coconut water  Answer theses question about your everyday activities: Can you perform most household chores? Yes Are you deaf or have significant trouble hearing? Yes  Do you feel that you have a problem with memory? No Do you feel safe at home? Yes  Last dentist visit? 10/24 8. Do you have any difficulty performing your everyday activities?No Are  you having any difficulty walking, taking medications on your own, and or difficulty managing daily home needs?No Do you have difficulty walking or climbing stairs?No Do you have difficulty dressing or bathing?No Do you have difficulty doing errands alone such as visiting a doctor's office or shopping?No Do you currently have any difficulty preparing food and eating?No Do you currently have any difficulty using the toilet?No Do you have any difficulty managing your finances?No Do you have any difficulties with housekeeping of managing your housekeeping?No   Do you have Advanced Directives in place (Living Will, Healthcare Power or Attorney)? no   Last eye Exam and location?  1 year ago    Do you currently use prescribed or non-prescribed narcotic or opioid pain medications?no   Do you have a history or close family history of breast, ovarian, tubal or peritoneal cancer or a family member with BRCA (breast cancer susceptibility 1 and 2) gene mutations? No    Nurse/Assistant Credentials/time stamp: Mg 4:10 pm   ----------------------------------------------------------------------------------------------------------------------------------------------------------------------------------------------------------------------  Because this visit was a virtual/telehealth visit, some criteria may be missing or patient reported. Any vitals not documented were not able to be obtained and vitals that have been documented are patient reported.    MEDICARE ANNUAL PREVENTIVE CARE VISIT WITH PROVIDER (Welcome to Medicare, initial annual wellness or annual wellness exam)  Virtual Visit via Phone Note  I connected with Shirlyn Goltz on 02/23/23  by phone per patient preference (declined video option) and verified that I am speaking with the correct person using two identifiers.  Location patient: home  Location provider:work or home office Persons participating in the virtual visit: patient,  provider  Concerns and/or follow up today: reports is doing ok. Has had some issues with digestion that are chronic - constipation and sometimes hemorrhoids. Has talked with his PCP and does metamucil and has cream for hemorrhoids when they flare.  Also is dealing with some stress with family issues. Seeing counselor and has good support from his children so feels is doing what he can and not depression.    See HM section in Epic for other details of completed HM.    ROS: negative for report of fevers, unintentional weight loss, vision changes, vision loss, hearing loss or change, chest pain, sob, hemoptysis, melena, hematochezia, hematuria, falls, bleeding or bruising, thoughts of suicide or self harm, memory loss  Patient-completed extensive health risk assessment - reviewed and discussed with the patient: See Health Risk Assessment completed with patient prior to the visit either above or in recent phone note. This was reviewed in detailed with the patient today and appropriate recommendations, orders and referrals were placed as needed per Summary below and patient instructions.   Review of Medical History: -PMH, PSH, Family History and current specialty and care providers reviewed and updated and listed below   Patient Care Team: Kristian Covey, MD as PCP - General   Past Medical History:  Diagnosis Date   ALLERGIC RHINITIS 09/04/2009   Basal cell carcinoma    Cancer (HCC) 2012   melanoma   DYSLIPIDEMIA 09/04/2009   GERD 09/04/2009   High cholesterol    just above borderline per patient    History of hiatal hernia    History of kidney stones    Pneumonia 2014   Hx    Past Surgical History:  Procedure Laterality Date   CHOLECYSTECTOMY     GALLBLADDER SURGERY  01/2018   INGUINAL HERNIA REPAIR Left 04/06/2022   Procedure: LAPAROSCOPIC LEFT INGUINAL HERNIA REPAIR WITH MESH;  Surgeon: Axel Filler, MD;  Location[facility] OR;  Service: General;  Laterality: Left;    SHOULDER ARTHROSCOPY WITH OPEN ROTATOR CUFF REPAIR AND DISTAL CLAVICLE ACROMINECTOMY Left 04/13/2021   Procedure: LEFT SHOULDER ARTHROSCOPY, DEBRIDEMENT, MINI OPEN ROTATOR CUFF TEAR REPAIR, BICEPS TENODESIS;  Surgeon: Cammy Copa, MD;  Location: [facility] OR;  Service: Orthopedics;  Laterality: Left;   SHOULDER SURGERY  1986, 2009   left shoulder scromoplasty   TONSILLECTOMY     TRIGGER FINGER RELEASE Left     Social History   Socioeconomic History   Marital status: Married    Spouse name: Lanora Manis   Number of children: 3   Years of education: [employer] education level: Associate degree: occupational, Scientist, product/process development, or vocational program  Occupational History   Occupation: Counsellor      Comment: Tax adviser  Tobacco Use   Smoking status: Former    Current packs/day: 0.00    Average packs/day: 1 pack/day for 20.0 years (20.0 ttl pk-yrs)    Types: Cigarettes    Start date: 01/24/1962    Quit date: 01/24/1982    Years since quitting: 41.1   Smokeless tobacco: Never  Vaping Use   Vaping status: Never Used  Substance and Sexual Activity   Alcohol use: Yes    Alcohol/week: 0.0 standard drinks of alcohol    Comment: socially   Drug use: No   Sexual activity: Not Currently  Other Topics Concern   Not on file  Social History Narrative   Lives at home with  wife and youngest daughter.   Has 3 children.    Caffeine: 2 cups/day    Social Drivers of Corporate investment banker Strain: Low Risk  (02/23/2023)   Overall Financial Resource Strain (CARDIA)    Difficulty of Paying Living Expenses: Not hard at all  Food Insecurity: No Food Insecurity (02/23/2023)   Hunger Vital Sign    Worried About Running Out of Food in the Last Year: Never true    Ran Out of Food in the Last Year: Never true  Transportation Needs: No Transportation Needs (02/23/2023)   PRAPARE - Administrator, Civil Service (Medical): No    Lack of Transportation (Non-Medical): No   Physical Activity: Insufficiently Active (02/23/2023)   Exercise Vital Sign    Days of Exercise per Week: 3 days    Minutes of Exercise per Session: 30 min  Stress: No Stress Concern Present (02/23/2023)   [employer] of Occupational Health - Occupational Stress Questionnaire    Feeling of Stress : Not at all  Social Connections: Moderately Integrated (02/23/2023)   Social Connection and Isolation Panel [NHANES]    Frequency of Communication with Friends and Family: Three times a week    Frequency of Social Gatherings with Friends and Family: Never    Attends Religious Services: Never    Database administrator or Organizations: Yes    Attends Engineer, structural: More than 4 times per year    Marital Status: Married  Catering manager Violence: Not At Risk (02/23/2023)   Humiliation, Afraid, Rape, and Kick questionnaire    Fear of Current or Ex-Partner: No    Emotionally Abused: No    Physically Abused: No    Sexually Abused: No    Family History  Problem Relation Age of Onset   Heart disease Mother        CVA and cardiac arrhythmia, ? atrial fib   Stroke Mother    Hypertension Mother    Melanoma Father    Glaucoma Father    Glaucoma Brother     Current Outpatient Medications on File Prior to Visit  Medication Sig Dispense Refill   atorvastatin (LIPITOR) 20 MG tablet TAKE ONE TABLET BY MOUTH DAILY 90 tablet 3   gabapentin (NEURONTIN) 100 MG capsule TAKE ONE CAPSULE BY MOUTH EVERY EVENING FOR 3 DAYS THEN TITRATE UP TO TWO CAPSULES EVERY EVENING IF NEEDED 60 capsule 0   pantoprazole (PROTONIX) 40 MG tablet TAKE 1 TABLET BY MOUTH DAILY 90 tablet 1   psyllium (METAMUCIL) 58.6 % powder Take 1 packet by mouth daily.     tadalafil (CIALIS) 5 MG tablet TAKE 1 TABLET BY MOUTH DAILY 30 tablet 1   tamsulosin (FLOMAX) 0.4 MG CAPS capsule TAKE 1 CAPSULE BY MOUTH DAILY 90 capsule 0   No current facility-administered medications on file prior to visit.    Allergies   Allergen Reactions   Aleve [Naproxen] Hives, Swelling and Other (See Comments)    Can take ibuprofen   Levaquin [Levofloxacin]     hives and GI upset   Percocet [Oxycodone-Acetaminophen]     Pt prefers not to take        Physical Exam Vitals requested from patient and listed below if patient had equipment and was able to obtain at home for this virtual visit: There were no vitals filed for this visit. Estimated body mass index is 25.7 kg/m as calculated from the following:   Height as of 12/27/22: 5\' 6"  (1.676 m).  Weight as of 12/27/22: 159 lb 3.2 oz (72.2 kg).  EKG (optional): deferred due to virtual visit  GENERAL: alert, oriented, no acute distress detected; full vision exam deferred due to pandemic and/or virtual encounter  PSYCH/NEURO: pleasant and cooperative, no obvious depression or anxiety, speech and thought processing grossly intact, Cognitive function grossly intact  Flowsheet Row Office Visit from 02/23/2023 in [facility] [place]  PHQ-9 Total Score 0           02/23/2023    3:57 PM 02/02/2022    8:40 AM 11/22/2021   11:16 AM 11/16/2021   11:53 AM 06/30/2021    7:17 AM  Depression screen PHQ 2/9  Decreased Interest 0 1 1 1 2   Down, Depressed, Hopeless 0 0 1 1 2   PHQ - 2 Score 0 1 2 2 4   Altered sleeping 0  3 3 3   Tired, decreased energy 0  2 0 2  Change in appetite 0  2 1 2   Feeling bad or failure about yourself  0  1 0 3  Trouble concentrating 0  0 0 1  Moving slowly or fidgety/restless 0  1 0 1  Suicidal thoughts 0  0 0 0  PHQ-9 Score 0  11 6 16   Difficult doing work/chores Not difficult at all  Somewhat difficult Somewhat difficult Somewhat difficult       11/15/2021   12:48 PM 11/16/2021   11:53 AM 11/12/2022    8:47 AM 02/19/2023    8:56 AM 02/23/2023    3:59 PM  Fall Risk  Falls in the past year? 0 0 0 0 0  Was there an injury with Fall?  0  0 0  Fall Risk Category Calculator  0  0  0  Fall Risk Category (Retired)   Low     (RETIRED) Patient Fall Risk Level  Low fall risk     Patient at Risk for Falls Due to  Medication side effect   No Fall Risks  Fall risk Follow up  Falls prevention discussed;Education provided;Falls evaluation completed   Falls evaluation completed     Patient-reported     SUMMARY AND PLAN:  Encounter for Medicare annual wellness exam  Discussed applicable health maintenance/preventive health measures and advised and referred or ordered per patient preferences: -advised on updated covid vaccine per CDC, advised can get at pharmacy if decides to do Health Maintenance  Topic Date Due   COVID-19 Vaccine (5 - 2024-25 season) 09/25/2022   Medicare Annual Wellness (AWV)  02/23/2024   DTaP/Tdap/Td (2 - Td or Tdap) 04/01/2024   Colonoscopy  11/30/2031   Pneumonia Vaccine 43+ Years old  Completed   INFLUENZA VACCINE  Completed   Hepatitis C Screening  Completed   Zoster Vaccines- Shingrix  Completed   HPV VACCINES  Aged Out     Education and counseling on the following was provided based on the above review of health and a plan/checklist for the patient, along with additional information discussed, was provided for the patient in the patient instructions :  -Advised on importance of completing advanced directives, discussed options for completing and provided information in patient instructions as well -Advised and counseled on a healthy lifestyle - including the importance of a healthy diet, regular physical activity, social connections and stress management. -Reviewed patient's current diet. Advised and counseled on a whole foods based healthy diet. A summary of a healthy diet was provided in the Patient Instructions. We discussed dietary things related  to constipation and advised when doing grains to only do whole grain, focus on getting increased servings of veggies and fruits, ensure pre and probiotic foods, etc. Also discussed otc options for constipation management and advise  to see PCP or GI if not improving.  -reviewed patient's current physical activity level and discussed exercise guidelines for adults. Discussed deas for safe exercise at home to assist in meeting exercise guideline recommendations in a safe and healthy way.  -Advise yearly dental visits at minimum and regular eye exams -Advised and counseled on alcohol safe limits, risks Follow up: see patient instructions   Patient Instructions  I really enjoyed getting to talk with you today! I am available on Tuesdays and Thursdays for virtual visits if you have any questions or concerns, or if I can be of any further assistance.   CHECKLIST FROM ANNUAL WELLNESS VISIT:  -Follow up (please call to schedule if not scheduled after visit):   -yearly for annual wellness visit with primary care office  Here is a list of your preventive care/health maintenance measures and the plan for each if any are due:  PLAN For any measures below that may be due:  -can get the covid vaccine at the pharmacy  Health Maintenance  Topic Date Due   COVID-19 Vaccine (5 - 2024-25 season) 09/25/2022   Medicare Annual Wellness (AWV)  02/23/2024   DTaP/Tdap/Td (2 - Td or Tdap) 04/01/2024   Colonoscopy  11/30/2031   Pneumonia Vaccine 62+ Years old  Completed   INFLUENZA VACCINE  Completed   Hepatitis C Screening  Completed   Zoster Vaccines- Shingrix  Completed   HPV VACCINES  Aged Out    -See a dentist at least yearly  -Get your eyes checked and then per your eye specialist's recommendations  -Other issues addressed today:   -I have included below further information regarding a healthy whole foods based diet, physical activity guidelines for adults, stress management and opportunities for social connections. I hope you find this information useful.    -----------------------------------------------------------------------------------------------------------------------------------------------------------------------------------------------------------------------------------------------------------    NUTRITION: -eat real food: lots of colorful vegetables (half the plate) and fruits -5-7 servings of vegetables and fruits per day (fresh or steamed is best), exp. 2 servings of vegetables with lunch and dinner and 2 servings of fruit per day. Berries and greens such as kale and collards are great choices.  -consume on a regular basis:  fresh fruits, fresh veggies, fish, nuts, seeds, healthy oils (such as olive oil, avocado oil), whole grains (make sure first ingredient on label contains the word "whole"), -can eat small amounts of dairy and lean meat (no larger than the palm of your hand), but avoid processed meats such as ham, bacon, lunch meat, etc. -drink water -try to avoid fast food and pre-packaged foods, processed meat, ultra processed foods (donuts, candy, etc.) -most experts advise limiting sodium to < 2300mg  per day, should limit further is any chronic conditions such as high blood pressure, heart disease, diabetes, etc. The American Heart Association advised that < 1500mg  is is ideal -try to avoid foods that contain any ingredients with names you do not recognize  -try to avoid foods with added sugar or sweeteners/sweets  -try to avoid sweet drinks -try to avoid white rice, white bread, pasta (unless whole grain)  EXERCISE GUIDELINES FOR ADULTS: -if you wish to increase your physical activity, do so gradually and with the approval of your doctor -STOP and seek medical care immediately if you have any chest pain, chest discomfort  or trouble breathing when starting or increasing exercise  -move and stretch your body, legs, feet and arms when sitting for long periods -Physical activity guidelines for optimal health in adults: -get  at least 150 minutes per week of moderate exercise (can talk, but not sing); this is about 20-30 minutes of sustained activity 5-7 days per week or two 10-15 minute episodes of sustained activity 5-7 days per week -do some muscle building/resistance training at least 2 days per week  -balance exercises 3+ days per week:   Stand somewhere where you have something sturdy to hold onto if you lose balance.    1) lift up on toes, start with 5x per day and work up to 20x   2) stand and lift on leg straight out to the side so that foot is a few inches of the floor, start with 5x each side and work up to 20x each side   3) stand on one foot, start with 5 seconds each side and work up to 20 seconds on each side  If you need ideas or help with getting more active:  -Silver sneakers https://tools.silversneakers.com  -Walk with a Doc: http://www.duncan-williams.com/  -try to include resistance (weight lifting/strength building) and balance exercises twice per week: or the following link for ideas: http://castillo-powell.com/  BuyDucts.dk  STRESS MANAGEMENT: -can try meditating, or just sitting quietly with deep breathing while intentionally relaxing all parts of your body for 5 minutes daily -if you need further help with stress, anxiety or depression please follow up with your primary doctor or contact the wonderful folks at [facility]: [phone]  SOCIAL CONNECTIONS: -options in [place] if you wish to engage in more social and exercise related activities:  -Silver sneakers https://tools.silversneakers.com  -Walk with a Doc: http://www.duncan-williams.com/  -Check out the [facility] Active Adults 50+ section on the [place] of [employer] (hiking clubs, book clubs, cards and games, chess, exercise classes, aquatic classes and much more) - see the website for  details: https://www.[place].gov/departments/parks-recreation/active-adults50  -YouTube has lots of exercise videos for different ages and abilities as well  -[facility] (a variety of indoor and outdoor inperson activities for adults). [phone]. [address]  -Virtual Online Classes (a variety of topics): see seniorplanet.org or call [phone]  -consider volunteering at a school, hospice center, church, senior center or elsewhere    ADVANCED HEALTHCARE DIRECTIVES:  [place] Advanced Directives assistance:   ExpressWeek.com.cy  Everyone should have advanced health care directives in place. This is so that you get the care you want, should you ever be in a situation where you are unable to make your own medical decisions.   From the [place] Advanced Directive Website: "Advance Health Care Directives are legal documents in which you give written instructions about your health care if, in the future, you cannot speak for yourself.   A health care power of attorney allows you to name a person you trust to make your health care decisions if you cannot make them yourself. A declaration of a desire for a natural death (or living will) is document, which states that you desire not to have your life prolonged by extraordinary measures if you have a terminal or incurable illness or if you are in a vegetative state. An advance instruction for mental health treatment makes a declaration of instructions, information and preferences regarding your mental health treatment. It also states that you are aware that the advance instruction authorizes a mental health treatment provider to act according to your  wishes. It may also outline your consent or refusal of mental health treatment. A declaration of an anatomical gift allows anyone over the age of 44 to make a gift by will, organ donor card or other document."   Please  see the following website or an elder law attorney for forms, FAQs and for completion of advanced directives: [place] TEFL teacher Health Care Directives Advance Health Care Directives (http://guzman.com/)  Or copy and paste the following to your web browser: PoshChat.fi          Terressa Koyanagi, DO

## 2023-02-23 NOTE — Progress Notes (Signed)
Patient unable to obtain vital signs due to telehealth visit

## 2023-03-13 ENCOUNTER — Ambulatory Visit (INDEPENDENT_AMBULATORY_CARE_PROVIDER_SITE_OTHER): Payer: No Typology Code available for payment source | Admitting: Behavioral Health

## 2023-03-13 ENCOUNTER — Ambulatory Visit (INDEPENDENT_AMBULATORY_CARE_PROVIDER_SITE_OTHER): Payer: No Typology Code available for payment source | Admitting: Family Medicine

## 2023-03-13 ENCOUNTER — Encounter: Payer: Self-pay | Admitting: Family Medicine

## 2023-03-13 VITALS — BP 110/80 | HR 65 | Temp 98.2°F | Wt 160.8 lb

## 2023-03-13 DIAGNOSIS — M5441 Lumbago with sciatica, right side: Secondary | ICD-10-CM | POA: Diagnosis not present

## 2023-03-13 DIAGNOSIS — F4323 Adjustment disorder with mixed anxiety and depressed mood: Secondary | ICD-10-CM

## 2023-03-13 MED ORDER — PREDNISONE 10 MG PO TABS: ORAL_TABLET | ORAL | 0 refills | Status: AC

## 2023-03-13 MED ORDER — METHOCARBAMOL 500 MG PO TABS: 500.0000 mg | ORAL_TABLET | Freq: Three times a day (TID) | ORAL | 0 refills | Status: AC | PRN

## 2023-03-13 NOTE — Patient Instructions (Signed)
-  Symptoms correlate with sciatica pain. Provided information about sciatica.  -Prescribed Prednisone 10mg , 6 day taper and Robaxin 500mg  tablet, take 1 tablet every 8 hours as needed for muscle spasms for pain.  -Advise to not take any NSAIDS, such as Ibuprofen, Advil, Aleve, or Naproxen while taking Prednisone.  -Caution: Robaxin can cause drowsiness.  -May still take Gabapentin at night time, but can cause more drowsiness if taken with Robaxin.  -If not improved or symptoms become worse, please follow up with Dr. Caryl Never or myself.

## 2023-03-13 NOTE — Progress Notes (Signed)
[facility] Counselor/Therapist Progress Note  Patient ID: Dennis Villarreal, MRN: [id number],    Date: 03/13/2023  Time Spent: 55 min Caregility video; Pt is home in private & Provider is working remotely from [facility] [employer] [facility]. Pt is aware of the risks/limitations of telehealth & consents to Tx today.  Time In: 10:00am Time Out: 10:55am   Treatment Type: Individual Therapy  Reported Symptoms: Elevated anx/dep & reduction in marital distress  Mental Status Exam: Appearance:  Casual     Behavior: Appropriate and Sharing  Motor: Normal  Speech/Language:  Clear and Coherent  Affect: Appropriate  Mood: normal  Thought process: normal  Thought content:   WNL  Sensory/Perceptual disturbances:   WNL  Orientation: oriented to person, place, time/date, and situation  Attention: Good  Concentration: Good  Memory: WNL  Fund of knowledge:  Good  Insight:   Good  Judgment:  Good  Impulse Control: Good   Risk Assessment: Danger to Self:  No Self-injurious Behavior: No Danger to Others: No Duty to Warn:no Physical Aggression / Violence:No  Access to Firearms a concern: No  Gang Involvement:No   Subjective: Pt reports feeling, "down & out lately". He is interested in travelling this year alone. The situation w/his Wife has not changed. His 2 Dtrs support him fully about the marital conflict. His Wife is not engaged in the relationship. She wants him to listen & not comment per Pt report. She will tell him things that are daily details, but turn away when it becomes important. Pt has a hearing loss for 10-15 yrs now. It has only worsened.   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 He will turn his hearing aids lower or turn them off altogether. He is working 3-4 days/wk @ 30-32 hrs/wk.   Interventions: Insight-Oriented and Family Systems  Diagnosis:Adjustment disorder with mixed anxiety and depressed mood  Plan: Discussed the impact of his hearing  loss on his relationship. Provided psychoedu for hearing loss & cognitive decline. Tim has minimal interaction w/his Wife. He is trying to minimize the conflict, but ignoring ea other does not provide any time to discuss problems or work through them. She is often so quiet it startles Tim to see her. He does not like the situation. He feels btwn a rock & a hard place. He cannot find a way to be assertive about his needs & it work out. He has determined the situation is not about his Wife, it is about himself. He will cont to record in his Notebook, his observations about his marriage.   Target Date: 04/08/2023  Progress: 5  Frequency: Once every 2-3 wks  Modality: Claretta Fraise, LMFT

## 2023-03-13 NOTE — Progress Notes (Signed)
   Dennis Lever, LMFT

## 2023-03-13 NOTE — Progress Notes (Signed)
Acute Office Visit   Subjective:  Patient ID: Dennis Villarreal, male    DOB: 1950-12-25, 73 y.o.   MRN: [id number]  Chief Complaint  Patient presents with   Back Pain    right    HPI:  Patient is complaining of right lower back pain. He reports the last two weeks he has had some muscle "fluttering" with some decrease strength of the right leg. He reports he went on bike rides last week while in [place] and noticed symptoms become worse. He describes pain as a "catching" pain, all of a sudden tight feeling. Intermittent. Mainly in the morning and when over exerting himself during the day. He reports pain usually last at least 30 minutes. He reports he takes Ibuprofen and had a few Robaxin that have seemed to help. He did take Gabapentin that he reports he usually takes for bilateral thigh pain that seemed to not help with this pain. He has been in a hot tub that seemed to help some.  He reports intermittent numbness and tingling that starts at the upper thigh, either inner or outer thigh. Usually last about 20 seconds that usually occurs 2-3 times a day. He reports the pain radiates from right lower back into the right leg. Overall the pain has improved some since starting 2 weeks ago.   Denies loss of bowel or bladder.  Denies any recent injury or doing anything out of the ordinary.  He reports he does some lifting at work, usually about 40 pounds or less. But, does not recall lifting anything abnormal.    Review of Systems  Musculoskeletal:  Positive for back pain.   See HPI above      Objective:   BP 110/80 (BP Location: Left Arm, Patient Position: Sitting, Cuff Size: Large)   Pulse 65   Temp 98.2 F (36.8 C) (Oral)   Wt 160 lb 12.8 oz (72.9 kg)   SpO2 98%   BMI 25.95 kg/m    Physical Exam Vitals reviewed.  Constitutional:      General: He is not in acute distress.    Appearance: Normal appearance. He is not ill-appearing, toxic-appearing or diaphoretic.  Eyes:      General:        Right eye: No discharge.        Left eye: No discharge.     Conjunctiva/sclera: Conjunctivae normal.  Cardiovascular:     Rate and Rhythm: Normal rate and regular rhythm.     Heart sounds: Normal heart sounds. No murmur heard.    No friction rub. No gallop.  Pulmonary:     Effort: Pulmonary effort is normal. No respiratory distress.     Breath sounds: Normal breath sounds.  Musculoskeletal:        General: Normal range of motion.     Lumbar back: No tenderness. Negative right straight leg raise test and negative left straight leg raise test.     Comments: Right lateral thigh pain with intern hip rotation    Skin:    General: Skin is warm and dry.  Neurological:     General: No focal deficit present.     Mental Status: He is alert and oriented to person, place, and time. Mental status is at baseline.     Motor: No weakness.     Gait: Gait normal.  Psychiatric:        Mood and Affect: Mood normal.        Behavior: Behavior normal.  Thought Content: Thought content normal.        Judgment: Judgment normal.       Assessment & Plan:  Acute right-sided low back pain with right-sided sciatica -     predniSONE; Take 6 tablets (60 mg total) by mouth daily with breakfast for 1 day, THEN 5 tablets (50 mg total) daily with breakfast for 1 day, THEN 4 tablets (40 mg total) daily with breakfast for 1 day, THEN 3 tablets (30 mg total) daily with breakfast for 1 day, THEN 2 tablets (20 mg total) daily with breakfast for 1 day, THEN 1 tablet (10 mg total) daily with breakfast for 1 day.  Dispense: 21 tablet; Refill: 0 -     Methocarbamol; Take 1 tablet (500 mg total) by mouth every 8 (eight) hours as needed for up to 5 days.  Dispense: 15 tablet; Refill: 0  -Symptoms correlate with sciatica pain. Provided information about sciatica.  -Prescribed Prednisone 10mg , 6 day taper and Robaxin 500mg  tablet, take 1 tablet every 8 hours as needed for muscle spasms for pain.  -Advise  to not take any NSAIDS, such as Ibuprofen, Advil, Aleve, or Naproxen while taking Prednisone.  -Caution: Robaxin can cause drowsiness.  -May still take Gabapentin at night time, but can cause more drowsiness if taken with Robaxin.  -If not improved or symptoms become worse, please follow up with Dr. Caryl Never or this provider.   Zandra Abts, NP

## 2023-03-22 ENCOUNTER — Other Ambulatory Visit: Payer: Self-pay | Admitting: Family Medicine

## 2023-03-22 DIAGNOSIS — M5441 Lumbago with sciatica, right side: Secondary | ICD-10-CM

## 2023-04-04 DIAGNOSIS — K219 Gastro-esophageal reflux disease without esophagitis: Secondary | ICD-10-CM | POA: Diagnosis not present

## 2023-04-04 DIAGNOSIS — F17211 Nicotine dependence, cigarettes, in remission: Secondary | ICD-10-CM | POA: Diagnosis not present

## 2023-04-04 DIAGNOSIS — N401 Enlarged prostate with lower urinary tract symptoms: Secondary | ICD-10-CM | POA: Diagnosis not present

## 2023-04-04 DIAGNOSIS — E785 Hyperlipidemia, unspecified: Secondary | ICD-10-CM | POA: Diagnosis not present

## 2023-04-04 DIAGNOSIS — M545 Low back pain, unspecified: Secondary | ICD-10-CM | POA: Diagnosis not present

## 2023-04-04 DIAGNOSIS — Z008 Encounter for other general examination: Secondary | ICD-10-CM | POA: Diagnosis not present

## 2023-04-05 ENCOUNTER — Encounter: Payer: Self-pay | Admitting: Family Medicine

## 2023-04-05 ENCOUNTER — Ambulatory Visit (INDEPENDENT_AMBULATORY_CARE_PROVIDER_SITE_OTHER): Admitting: Family Medicine

## 2023-04-05 VITALS — BP 146/70 | HR 62 | Temp 97.9°F | Wt 160.1 lb

## 2023-04-05 DIAGNOSIS — M5441 Lumbago with sciatica, right side: Secondary | ICD-10-CM

## 2023-04-05 MED ORDER — METHOCARBAMOL 500 MG PO TABS: 500.0000 mg | ORAL_TABLET | Freq: Three times a day (TID) | ORAL | 2 refills | Status: AC | PRN

## 2023-04-05 NOTE — Progress Notes (Signed)
 Established Patient Office Visit  Subjective   Patient ID: Dennis Villarreal, male    DOB: September 11, 1950  Age: 73 y.o. MRN: [id number]  Chief Complaint  Patient presents with   Medication Refill    HPI   [name] is seen for follow-up regarding recent back pain with right-sided sciatica symptoms.  His had some somewhat chronic intermittent back pain.  Had MRI 2022 which showed mild for age degenerative changes and some bulging of disc at various levels.  He was seen here recently and prescribed Robaxin which he feels like has helped.  He is using this intermittently.  He also had some steroids at that time.  Overall is better.  Denies any urine or stool incontinence.  No lower extremity numbness or weakness.  Recently joined a [employer] and plans to start fitness program.  He is aware to avoid back flexion exercises.  He has been doing some regular extension back stretches which have helped.  Past Medical History:  Diagnosis Date   ALLERGIC RHINITIS 09/04/2009   Basal cell carcinoma    Cancer (HCC) 2012   melanoma   DYSLIPIDEMIA 09/04/2009   GERD 09/04/2009   High cholesterol    just above borderline per patient    History of hiatal hernia    History of kidney stones    Pneumonia 2014   Hx   Past Surgical History:  Procedure Laterality Date   CHOLECYSTECTOMY     GALLBLADDER SURGERY  01/2018   INGUINAL HERNIA REPAIR Left 04/06/2022   Procedure: LAPAROSCOPIC LEFT INGUINAL HERNIA REPAIR WITH MESH;  Surgeon: Axel Filler, MD;  Location: [facility];  Service: General;  Laterality: Left;   SHOULDER ARTHROSCOPY WITH OPEN ROTATOR CUFF REPAIR AND DISTAL CLAVICLE ACROMINECTOMY Left 04/13/2021   Procedure: LEFT SHOULDER ARTHROSCOPY, DEBRIDEMENT, MINI OPEN ROTATOR CUFF TEAR REPAIR, BICEPS TENODESIS;  Surgeon: Cammy Copa, MD;  Location: [facility] OR;  Service: Orthopedics;  Laterality: Left;   SHOULDER SURGERY  1986, 2009   left shoulder scromoplasty   TONSILLECTOMY     TRIGGER FINGER RELEASE  Left     reports that he quit smoking about 41 years ago. His smoking use included cigarettes. He started smoking about 61 years ago. He has a 20 pack-year smoking history. He has never used smokeless tobacco. He reports current alcohol use. He reports that he does not use drugs. family history includes Glaucoma in his brother and father; Heart disease in his mother; Hypertension in his mother; Melanoma in his father; Stroke in his mother. Allergies  Allergen Reactions   Levaquin [Levofloxacin]     hives and GI upset   Percocet [Oxycodone-Acetaminophen]     Pt prefers not to take     Review of Systems  Constitutional:  Negative for chills, fever and weight loss.  Musculoskeletal:  Positive for back pain.  Neurological:  Negative for tingling and focal weakness.      Objective:     BP (!) 146/70 (BP Location: Left Arm, Patient Position: Sitting, Cuff Size: Normal)   Pulse 62   Temp 97.9 F (36.6 C) (Oral)   Wt 160 lb 1.6 oz (72.6 kg)   SpO2 95%   BMI 25.84 kg/m    Physical Exam Vitals reviewed.  Constitutional:      General: He is not in acute distress.    Appearance: He is not ill-appearing.  Cardiovascular:     Rate and Rhythm: Normal rate and regular rhythm.  Musculoskeletal:     Comments: Straight leg  raise is negative bilaterally  Good range of motion both hips  Neurological:     Mental Status: He is alert.     Comments: Full strength lower extremities.  2+ deep tendon reflex ankle and knee bilaterally      No results found for any visits on 04/05/23.    The 10-year ASCVD risk score (Arnett DK, et al., 2019) is: 21.7%    Assessment & Plan:   Patient seen for follow-up regarding recent acute back pain with right-sided sciatica symptoms.  Overall improved.  Nonfocal neuroexam.  He has improved with back extension exercises and Robaxin.  Requesting refill of Robaxin.  This was supplied.  He is aware this can cause sedation or dizziness.  Use with caution.   Reiterated the importance of continued back extension exercises and we also reviewed proper lifting.  Follow-up for any recurrent pain or other concerns  [name], MD

## 2023-04-17 DIAGNOSIS — N138 Other obstructive and reflux uropathy: Secondary | ICD-10-CM | POA: Diagnosis not present

## 2023-04-17 DIAGNOSIS — F1190 Opioid use, unspecified, uncomplicated: Secondary | ICD-10-CM | POA: Diagnosis not present

## 2023-04-17 DIAGNOSIS — N401 Enlarged prostate with lower urinary tract symptoms: Secondary | ICD-10-CM | POA: Diagnosis not present

## 2023-04-17 DIAGNOSIS — Z8042 Family history of malignant neoplasm of prostate: Secondary | ICD-10-CM | POA: Diagnosis not present

## 2023-04-17 DIAGNOSIS — G8929 Other chronic pain: Secondary | ICD-10-CM | POA: Diagnosis not present

## 2023-04-17 DIAGNOSIS — Z87442 Personal history of urinary calculi: Secondary | ICD-10-CM | POA: Diagnosis not present

## 2023-04-17 DIAGNOSIS — M5440 Lumbago with sciatica, unspecified side: Secondary | ICD-10-CM | POA: Diagnosis not present

## 2023-04-29 ENCOUNTER — Other Ambulatory Visit: Payer: Self-pay | Admitting: Family Medicine

## 2023-05-01 ENCOUNTER — Ambulatory Visit (INDEPENDENT_AMBULATORY_CARE_PROVIDER_SITE_OTHER): Payer: PRIVATE HEALTH INSURANCE | Admitting: Behavioral Health

## 2023-05-01 DIAGNOSIS — F32A Depression, unspecified: Secondary | ICD-10-CM | POA: Diagnosis not present

## 2023-05-01 DIAGNOSIS — F4323 Adjustment disorder with mixed anxiety and depressed mood: Secondary | ICD-10-CM | POA: Diagnosis not present

## 2023-05-01 DIAGNOSIS — Z630 Problems in relationship with spouse or partner: Secondary | ICD-10-CM

## 2023-05-01 DIAGNOSIS — F419 Anxiety disorder, unspecified: Secondary | ICD-10-CM | POA: Diagnosis not present

## 2023-05-01 NOTE — Progress Notes (Signed)
 [facility] Counselor/Therapist Progress Note  Patient ID: Dennis Villarreal, MRN: [id number],    Date: 05/01/2023  Time Spent: 45 min Caregility video; Pt is home in private & Provider is working remotely @ [facility]. Pt is aware of the risks/limitations of telehealth & consents to Tx today.  Time In: 10:00am Time Out: 10:45am  Treatment Type: Individual Therapy  Reported Symptoms: Reduction in some Sx of  anx/dep & relational stressors in the marriage  Mental Status Exam: Appearance:  Casual     Behavior: Appropriate and Sharing  Motor: Normal  Speech/Language:  Clear and Coherent  Affect: Appropriate  Mood: anxious and depressed  Thought process: normal  Thought content:   WNL  Sensory/Perceptual disturbances:   WNL  Orientation: oriented to person, place, time/date, and situation  Attention: Good  Concentration: Good  Memory: WNL  Fund of knowledge:  Good  Insight:   Fair  Judgment:  Good  Impulse Control: Good   Risk Assessment: Danger to Self:  No Self-injurious Behavior: No Danger to Others: No Duty to Warn:no Physical Aggression / Violence:No  Access to Firearms a concern: No  Gang Involvement:No   Subjective: Wife & Pt have attended one session of Mediation that invld ppw for "Pre-Separation Spousal Support". He does not know what his next step should be in this process.   Pt appreciated the copy of the Values Card Deck sent to him; Family, Forgiveness, Harmony, Humor, Assertiveness, & Compassion.  Pt is concerned for his Wife texting him; she only does this when she needs something. He wants to know if he should cont to text. He cont's to send the msg, "Why not start over?" He is walking on eggshells daily & is very tired from doing this mentally. Pt is wondering if he should even try anymore.  Wife is pushing for Mediation. They have both been given ppw to fill out & return to the Atty. Pt feels this is a tactic to drag the situation on further.  The Cpl will have to estb Separation & then, "endure that for an entire year."  Interventions:  Relational work/Cultural differences/Marital conflict  Diagnosis:Adjustment disorder with mixed anxiety and depressed mood  Marital conflict  Anxiety and depression  Plan: Requested Dennis Villarreal explain what it would look like if he quit trying? And emphasized that Dennis Villarreal sounds ready to move forward a step. Examined Dennis Villarreal's language today & explored if he is ready to take his next step & what does that mean to him? Is he maintaining his marital status for himself, his Family, society or for some other motive? He is very concerned for the Financial aspects of his relationship.   Provided Dennis Villarreal w/a Referral via mail to Dennis Villarreal, Certified Divorce Best boy, Mediator. Dennis Villarreal is feeling sick today, therefore his change back to Virtual today. We exited the session once his gut got upset.  Next session we will do In Person, as originally planned today due to his hearing deficit.  Target Date: 05/24/2023  Progress: 5  Frequency: Once every 2-3 wks  Modality: Claretta Fraise, LMFT

## 2023-05-01 NOTE — Progress Notes (Signed)
   Deneise Lever, LMFT

## 2023-05-23 ENCOUNTER — Ambulatory Visit: Payer: PRIVATE HEALTH INSURANCE | Admitting: Behavioral Health

## 2023-05-31 DIAGNOSIS — M9903 Segmental and somatic dysfunction of lumbar region: Secondary | ICD-10-CM | POA: Diagnosis not present

## 2023-05-31 DIAGNOSIS — M5117 Intervertebral disc disorders with radiculopathy, lumbosacral region: Secondary | ICD-10-CM | POA: Diagnosis not present

## 2023-05-31 DIAGNOSIS — M9905 Segmental and somatic dysfunction of pelvic region: Secondary | ICD-10-CM | POA: Diagnosis not present

## 2023-05-31 DIAGNOSIS — M9902 Segmental and somatic dysfunction of thoracic region: Secondary | ICD-10-CM | POA: Diagnosis not present

## 2023-05-31 DIAGNOSIS — M461 Sacroiliitis, not elsewhere classified: Secondary | ICD-10-CM | POA: Diagnosis not present

## 2023-05-31 DIAGNOSIS — M7918 Myalgia, other site: Secondary | ICD-10-CM | POA: Diagnosis not present

## 2023-05-31 DIAGNOSIS — M7911 Myalgia of mastication muscle: Secondary | ICD-10-CM | POA: Diagnosis not present

## 2023-05-31 DIAGNOSIS — M9901 Segmental and somatic dysfunction of cervical region: Secondary | ICD-10-CM | POA: Diagnosis not present

## 2023-06-07 DIAGNOSIS — M5117 Intervertebral disc disorders with radiculopathy, lumbosacral region: Secondary | ICD-10-CM | POA: Diagnosis not present

## 2023-06-07 DIAGNOSIS — M9903 Segmental and somatic dysfunction of lumbar region: Secondary | ICD-10-CM | POA: Diagnosis not present

## 2023-06-07 DIAGNOSIS — M9901 Segmental and somatic dysfunction of cervical region: Secondary | ICD-10-CM | POA: Diagnosis not present

## 2023-06-07 DIAGNOSIS — M7911 Myalgia of mastication muscle: Secondary | ICD-10-CM | POA: Diagnosis not present

## 2023-06-07 DIAGNOSIS — M9905 Segmental and somatic dysfunction of pelvic region: Secondary | ICD-10-CM | POA: Diagnosis not present

## 2023-06-07 DIAGNOSIS — M461 Sacroiliitis, not elsewhere classified: Secondary | ICD-10-CM | POA: Diagnosis not present

## 2023-06-07 DIAGNOSIS — M7918 Myalgia, other site: Secondary | ICD-10-CM | POA: Diagnosis not present

## 2023-06-07 DIAGNOSIS — M9902 Segmental and somatic dysfunction of thoracic region: Secondary | ICD-10-CM | POA: Diagnosis not present

## 2023-06-14 DIAGNOSIS — M9902 Segmental and somatic dysfunction of thoracic region: Secondary | ICD-10-CM | POA: Diagnosis not present

## 2023-06-14 DIAGNOSIS — M7918 Myalgia, other site: Secondary | ICD-10-CM | POA: Diagnosis not present

## 2023-06-14 DIAGNOSIS — M7911 Myalgia of mastication muscle: Secondary | ICD-10-CM | POA: Diagnosis not present

## 2023-06-14 DIAGNOSIS — M5117 Intervertebral disc disorders with radiculopathy, lumbosacral region: Secondary | ICD-10-CM | POA: Diagnosis not present

## 2023-06-14 DIAGNOSIS — M9903 Segmental and somatic dysfunction of lumbar region: Secondary | ICD-10-CM | POA: Diagnosis not present

## 2023-06-14 DIAGNOSIS — M9901 Segmental and somatic dysfunction of cervical region: Secondary | ICD-10-CM | POA: Diagnosis not present

## 2023-06-14 DIAGNOSIS — M9905 Segmental and somatic dysfunction of pelvic region: Secondary | ICD-10-CM | POA: Diagnosis not present

## 2023-06-14 DIAGNOSIS — M461 Sacroiliitis, not elsewhere classified: Secondary | ICD-10-CM | POA: Diagnosis not present

## 2023-06-20 DIAGNOSIS — L814 Other melanin hyperpigmentation: Secondary | ICD-10-CM | POA: Diagnosis not present

## 2023-06-20 DIAGNOSIS — Z85820 Personal history of malignant melanoma of skin: Secondary | ICD-10-CM | POA: Diagnosis not present

## 2023-06-20 DIAGNOSIS — Z85828 Personal history of other malignant neoplasm of skin: Secondary | ICD-10-CM | POA: Diagnosis not present

## 2023-06-20 DIAGNOSIS — L578 Other skin changes due to chronic exposure to nonionizing radiation: Secondary | ICD-10-CM | POA: Diagnosis not present

## 2023-06-20 DIAGNOSIS — D1801 Hemangioma of skin and subcutaneous tissue: Secondary | ICD-10-CM | POA: Diagnosis not present

## 2023-06-20 DIAGNOSIS — L821 Other seborrheic keratosis: Secondary | ICD-10-CM | POA: Diagnosis not present

## 2023-06-20 DIAGNOSIS — Z08 Encounter for follow-up examination after completed treatment for malignant neoplasm: Secondary | ICD-10-CM | POA: Diagnosis not present

## 2023-06-23 ENCOUNTER — Ambulatory Visit: Payer: Self-pay

## 2023-06-23 NOTE — Telephone Encounter (Signed)
 Chief Complaint: nose bleed Symptoms: nose bleeding Frequency: 2-3 times per day X 2 weeks Pertinent Negatives: Patient denies lightheadedness, dizziness, chest pain, fever, injuries, all other symptoms Disposition: [] ED /[x] Urgent Care (no appt availability in office) / [] Appointment(In office/virtual)/ []  [facility]/ [] Home Care/ [x] Refused Recommended Disposition /[] [facility]/ []  Follow-up with PCP Additional Notes:  Couple weeks of 2-3 nosebleeds per day and becoming more frequent. Last for a few minutes. He packs nostril and this usually stops the bleed. Yesterday had two nosebleeds, this happened after bending at the waist twice. Denies all other symptoms. Acute evaluation advised, none are available at practice location today, advised urgent care but patient requests to be scheduled in office on Monday as he current is stable without nose bleed. Scheduled acute evaluation on June 2 per patient request, he will proceed to uc/er if he has another nosebleed or develops lightheadedness, dizziness, chest pain, or new symptoms. Educated on care advice as documented in protocol, patient verbalized understanding. Discussed reasons to call back or call for EMS.     Copied from CRM [id number]. Topic: Clinical - Red Word Triage >> Jun 23, 2023  3:32 PM [place] wrote: Red Word that prompted transfer to Nurse Triage: Consistent nose bleeds daily the last couple weeks, coming from left nostril. Has gotten worse the last couple days Reason for Disposition  [1] Bleeding recurs 3 or more times in 24 hours AND [2] direct pressure applied correctly  Protocols used: Nosebleed-A-AH

## 2023-06-26 ENCOUNTER — Ambulatory Visit: Admitting: Family Medicine

## 2023-06-28 DIAGNOSIS — M7911 Myalgia of mastication muscle: Secondary | ICD-10-CM | POA: Diagnosis not present

## 2023-06-28 DIAGNOSIS — M7918 Myalgia, other site: Secondary | ICD-10-CM | POA: Diagnosis not present

## 2023-06-28 DIAGNOSIS — M9903 Segmental and somatic dysfunction of lumbar region: Secondary | ICD-10-CM | POA: Diagnosis not present

## 2023-06-28 DIAGNOSIS — M461 Sacroiliitis, not elsewhere classified: Secondary | ICD-10-CM | POA: Diagnosis not present

## 2023-06-28 DIAGNOSIS — M9905 Segmental and somatic dysfunction of pelvic region: Secondary | ICD-10-CM | POA: Diagnosis not present

## 2023-06-28 DIAGNOSIS — M9901 Segmental and somatic dysfunction of cervical region: Secondary | ICD-10-CM | POA: Diagnosis not present

## 2023-06-28 DIAGNOSIS — M9902 Segmental and somatic dysfunction of thoracic region: Secondary | ICD-10-CM | POA: Diagnosis not present

## 2023-06-28 DIAGNOSIS — M5117 Intervertebral disc disorders with radiculopathy, lumbosacral region: Secondary | ICD-10-CM | POA: Diagnosis not present

## 2023-07-08 ENCOUNTER — Other Ambulatory Visit: Payer: Self-pay | Admitting: Family Medicine

## 2023-07-22 ENCOUNTER — Other Ambulatory Visit: Payer: Self-pay | Admitting: Family Medicine

## 2023-07-22 DIAGNOSIS — M5441 Lumbago with sciatica, right side: Secondary | ICD-10-CM

## 2023-07-24 ENCOUNTER — Other Ambulatory Visit: Payer: Self-pay | Admitting: Family Medicine

## 2023-08-10 ENCOUNTER — Ambulatory Visit: Payer: Self-pay

## 2023-08-10 NOTE — Telephone Encounter (Addendum)
 Copied from CRM [id number]. Topic: Clinical - Medical Advice >> Aug 10, 2023 10:51 AM Dennis Villarreal wrote: Reason for CRM: Patient has been having some digestion issues, has a GI appointment on August 10th, but questioning if there is anything he can do in the meantime before that appointment. Callback number for patient is [phone].

## 2023-08-10 NOTE — Telephone Encounter (Signed)
 Left a message for the patient to return my call.

## 2023-08-10 NOTE — Telephone Encounter (Signed)
     FYI Only or Action Required?: FYI only for provider.  Patient was last seen in primary care on 04/05/2023 by Micheal Wolm ORN, MD.  Called Nurse Triage reporting Abdominal Pain, Hemorrhoids, and Nausea.  Symptoms began several years ago.  Interventions attempted: OTC medications:   and Prescription medications:  SABRA  Symptoms are: unchanged.  Triage Disposition: See PCP Within 2 Weeks  Patient/caregiver understands and will follow disposition?: YesReason for Disposition  Abdominal pain is a chronic symptom (recurrent or ongoing AND present > 4 weeks)  Answer Assessment - Initial Assessment Questions 1. LOCATION: Where does it hurt?      Lower abdominal pain 2. RADIATION: Does the pain shoot anywhere else? (e.g., chest, back)     denies 3. ONSET: When did the pain begin? (Minutes, hours or days ago)      Years ago 4. SUDDEN: Gradual or sudden onset?     Pain onset is gradual during episodes 5. PATTERN Does the pain come and go, or is it constant?     Comes and goes 6. SEVERITY: How bad is the pain?  (e.g., Scale 1-10; mild, moderate, or severe)     Abdominal pain, currently zero,  7. RECURRENT SYMPTOM: Have you ever had this type of stomach pain before? If Yes, ask: When was the last time? and What happened that time?      yes 8. CAUSE: What do you think is causing the stomach pain? (e.g., gallstones, recent abdominal surgery)     Family history of diverticulitis and colon ca 9. RELIEVING/AGGRAVATING FACTORS: What makes it better or worse? (e.g., antacids, bending or twisting motion, bowel movement)     Watching diet, limiting dairy, using hemorrhoid cream 10. OTHER SYMPTOMS: Do you have any other symptoms? (e.g., back pain, diarrhea, fever, urination pain, vomiting)       Nausea, urgency to bathroom for bowel movements, hemorrhoids, painful bowel movents, reports soft stool   Patient has appointment scheduled for 09/03/2023 with GI He would like  to know if Dr. Micheal would recommend anything to relieve the urgency to have a bowel movement until he is seen by GI  Protocols used: Abdominal Pain - Male-A-AH

## 2023-08-15 ENCOUNTER — Ambulatory Visit: Admitting: Family Medicine

## 2023-08-29 DIAGNOSIS — K219 Gastro-esophageal reflux disease without esophagitis: Secondary | ICD-10-CM | POA: Diagnosis not present

## 2023-08-29 DIAGNOSIS — R159 Full incontinence of feces: Secondary | ICD-10-CM | POA: Diagnosis not present

## 2023-08-29 DIAGNOSIS — K5904 Chronic idiopathic constipation: Secondary | ICD-10-CM | POA: Diagnosis not present

## 2023-08-29 DIAGNOSIS — K5790 Diverticulosis of intestine, part unspecified, without perforation or abscess without bleeding: Secondary | ICD-10-CM | POA: Diagnosis not present

## 2023-08-29 DIAGNOSIS — R140 Abdominal distension (gaseous): Secondary | ICD-10-CM | POA: Diagnosis not present

## 2023-09-20 ENCOUNTER — Encounter: Payer: Self-pay | Admitting: Family Medicine

## 2023-09-20 ENCOUNTER — Ambulatory Visit (INDEPENDENT_AMBULATORY_CARE_PROVIDER_SITE_OTHER): Admitting: Family Medicine

## 2023-09-20 VITALS — BP 130/70 | HR 64 | Temp 97.9°F | Wt 157.6 lb

## 2023-09-20 DIAGNOSIS — R519 Headache, unspecified: Secondary | ICD-10-CM | POA: Diagnosis not present

## 2023-09-20 DIAGNOSIS — R040 Epistaxis: Secondary | ICD-10-CM | POA: Diagnosis not present

## 2023-09-20 DIAGNOSIS — K5909 Other constipation: Secondary | ICD-10-CM

## 2023-09-20 MED ORDER — POLYETHYLENE GLYCOL 3350 17 G PO PACK: 17.0000 g | PACK | Freq: Every day | ORAL | 5 refills | Status: AC | PRN

## 2023-09-20 NOTE — Patient Instructions (Signed)
 Continue with nasal saline irrigation -especially at night  May apply small amount of vaseline topically anterior nose.   Be in touch if headaches increasing in frequency.

## 2023-09-20 NOTE — Progress Notes (Signed)
 Established Patient Office Visit  Subjective   Patient ID: Dennis Villarreal, male    DOB: 05-Jul-1950  Age: 73 y.o. MRN: [id number]  Chief Complaint  Patient presents with   Epistaxis   Headache    HPI   Velinda is seen today for several issues as follows  He states has had some intermittent nosebleeds left naris really since winter.  Recently started using some salt water irrigation and feels like frequency has decreased.  Denies any change in smell.  No aspirin or other anticoagulant use.  Nosebleeds are usually very brief and easy to control.  Has had some recent transient headaches.  Somewhat right sided but usually only last about 10 minutes or so.  These are infrequent occurring about 2 times per month.  No progressive headaches.  No exertional headaches.  Home blood pressures consistently around 120/80.  No nausea or vomiting.  No cognitive change, focal weakness, or other neurologic concern.  He has history of frequent constipation.  Went to GI.  They recommended MiraLAX .  He knows this is over-the-counter but is requesting prescription to see if this can be covered by his insurance.  Past Medical History:  Diagnosis Date   ALLERGIC RHINITIS 09/04/2009   Basal cell carcinoma    Cancer (HCC) 2012   melanoma   DYSLIPIDEMIA 09/04/2009   GERD 09/04/2009   High cholesterol    just above borderline per patient    History of hiatal hernia    History of kidney stones    Pneumonia 2014   Hx   Past Surgical History:  Procedure Laterality Date   CHOLECYSTECTOMY     GALLBLADDER SURGERY  01/2018   INGUINAL HERNIA REPAIR Left 04/06/2022   Procedure: LAPAROSCOPIC LEFT INGUINAL HERNIA REPAIR WITH MESH;  Surgeon: Rubin Calamity, MD;  Location: [facility] OR;  Service: General;  Laterality: Left;   SHOULDER ARTHROSCOPY WITH OPEN ROTATOR CUFF REPAIR AND DISTAL CLAVICLE ACROMINECTOMY Left 04/13/2021   Procedure: LEFT SHOULDER ARTHROSCOPY, DEBRIDEMENT, MINI OPEN ROTATOR CUFF TEAR REPAIR, BICEPS  TENODESIS;  Surgeon: Addie Cordella Hamilton, MD;  Location: [facility] OR;  Service: Orthopedics;  Laterality: Left;   SHOULDER SURGERY  1986, 2009   left shoulder scromoplasty   TONSILLECTOMY     TRIGGER FINGER RELEASE Left     reports that he quit smoking about 41 years ago. His smoking use included cigarettes. He started smoking about 61 years ago. He has a 20 pack-year smoking history. He has never used smokeless tobacco. He reports current alcohol use. He reports that he does not use drugs. family history includes Glaucoma in his brother and father; Heart disease in his mother; Hypertension in his mother; Melanoma in his father; Stroke in his mother. Allergies  Allergen Reactions   Levaquin [Levofloxacin]     hives and GI upset   Percocet [Oxycodone-Acetaminophen ]     Pt prefers not to take     Review of Systems  Constitutional:  Negative for chills, fever and malaise/fatigue.  HENT:  Positive for nosebleeds.   Eyes:  Negative for blurred vision.  Respiratory:  Negative for shortness of breath.   Cardiovascular:  Negative for chest pain.  Neurological:  Positive for headaches. Negative for dizziness and weakness.      Objective:     BP 130/70 (BP Location: Left Arm, Cuff Size: Normal)   Pulse 64   Temp 97.9 F (36.6 C) (Oral)   Wt 157 lb 9.6 oz (71.5 kg)   SpO2 98%   BMI 25.44  kg/m  BP Readings from Last 3 Encounters:  09/20/23 130/70  04/05/23 (!) 146/70  03/13/23 110/80   Wt Readings from Last 3 Encounters:  09/20/23 157 lb 9.6 oz (71.5 kg)  04/05/23 160 lb 1.6 oz (72.6 kg)  03/13/23 160 lb 12.8 oz (72.9 kg)      Physical Exam Vitals reviewed.  Constitutional:      General: He is not in acute distress.    Appearance: He is not ill-appearing.  HENT:     Head:     Comments: Right naris is clear.  Left naris reveals very minuscule amount of dried blood anterior naris.  No clear site of active bleeding.  No lesions noted. Cardiovascular:     Rate and Rhythm: Normal  rate and regular rhythm.  Pulmonary:     Effort: Pulmonary effort is normal.     Breath sounds: Normal breath sounds.  Neurological:     Mental Status: He is alert.     Cranial Nerves: No cranial nerve deficit.      No results found for any visits on 09/20/23.  Last CBC Lab Results  Component Value Date   WBC 8.4 04/04/2022   HGB 14.8 04/04/2022   HCT 42.6 04/04/2022   MCV 93.0 04/04/2022   MCH 32.3 04/04/2022   RDW 11.8 04/04/2022   PLT 184 04/04/2022   Last metabolic panel Lab Results  Component Value Date   GLUCOSE 104 (H) 10/10/2022   NA 137 10/10/2022   K 4.1 10/10/2022   CL 102 10/10/2022   CO2 28 10/10/2022   BUN 16 10/10/2022   CREATININE 0.82 10/10/2022   GFR 88.04 10/10/2022   CALCIUM  9.1 10/10/2022   PROT 6.8 10/10/2022   ALBUMIN 4.0 10/10/2022   LABGLOB 2.4 03/18/2014   AGRATIO 1.8 03/18/2014   BILITOT 0.8 10/10/2022   ALKPHOS 65 10/10/2022   AST 19 10/10/2022   ALT 19 10/10/2022   ANIONGAP 7 04/09/2021   Last lipids Lab Results  Component Value Date   CHOL 154 10/10/2022   HDL 56.90 10/10/2022   LDLCALC 76 10/10/2022   LDLDIRECT 175.6 04/07/2010   TRIG 106.0 10/10/2022   CHOLHDL 3 10/10/2022   Last hemoglobin A1c Lab Results  Component Value Date   HGBA1C 5.5 03/18/2014      The 10-year ASCVD risk score (Arnett DK, et al., 2019) is: 18%    Assessment & Plan:   #1 intermittent epistaxis left naris for months.  No obvious areas of bleeding Kiesselbach plexus.  He feels like this is occurring more laterally.  No lesions noted.  No polyps.  Very small amount of dried blood but no evidence for active bleeding. - Continue nasal saline irrigation regularly - Consider very small amount of Vaseline to prevent drying - If nosebleeds persist or specially picking up in frequency be in touch  #2 elevated blood pressure initially today without history of hypertension.  Home blood pressures consistently 120/80.  Repeat today after rest 130/70.   Continue to monitor  #3 history of chronic constipation.  Patient requesting prescription for MiraLAX .  He knows it is over-the-counter but we send in prescription to see if this will help with his coverage   Wolm Scarlet, MD

## 2023-10-09 ENCOUNTER — Other Ambulatory Visit: Payer: Self-pay | Admitting: Family Medicine

## 2023-10-20 ENCOUNTER — Other Ambulatory Visit: Payer: Self-pay | Admitting: Family Medicine

## 2023-10-30 DIAGNOSIS — N400 Enlarged prostate without lower urinary tract symptoms: Secondary | ICD-10-CM | POA: Diagnosis not present

## 2023-10-30 DIAGNOSIS — R42 Dizziness and giddiness: Secondary | ICD-10-CM | POA: Diagnosis not present

## 2023-11-01 ENCOUNTER — Other Ambulatory Visit: Payer: Self-pay | Admitting: Family Medicine

## 2023-11-01 MED ORDER — ATORVASTATIN CALCIUM 20 MG PO TABS: 20.0000 mg | ORAL_TABLET | Freq: Every day | ORAL | 0 refills | Status: DC

## 2023-11-01 NOTE — Telephone Encounter (Signed)
 Copied from CRM [id number]. Topic: Clinical - Medication Refill >> Nov 01, 2023 10:26 AM Dedra B wrote: Medication: atorvastatin  (LIPITOR) 20 MG tablet (100 day supply)  Caller: Justina from [facility], [phone] ext [phone]  Has the patient contacted their pharmacy? Yes, no refills   This is the patient's preferred pharmacy:  [id number] - [place], [place] - [address] [place] [address] Phone: [phone] Fax: [phone]  Is this the correct pharmacy for this prescription? Yes  Has the prescription been filled recently? No  Is the patient out of the medication? Caller doesn't know  Has the patient been seen for an appointment in the last year OR does the patient have an upcoming appointment? Yes  Can we respond through MyChart? Yes  Agent: Please be advised that Rx refills may take up to 3 business days. We ask that you follow-up with your pharmacy.

## 2023-11-04 ENCOUNTER — Other Ambulatory Visit: Payer: Self-pay | Admitting: Family Medicine

## 2023-12-06 ENCOUNTER — Other Ambulatory Visit: Payer: Self-pay | Admitting: Family Medicine

## 2023-12-06 MED ORDER — ATORVASTATIN CALCIUM 20 MG PO TABS
20.0000 mg | ORAL_TABLET | Freq: Every day | ORAL | 0 refills | Status: AC
Start: 2023-12-06 — End: ?

## 2023-12-06 NOTE — Telephone Encounter (Signed)
 Copied from CRM [id number]. Topic: Clinical - Medication Refill >> Dec 06, 2023 12:51 PM Ashley R wrote: Medication:  atorvastatin  (LIPITOR) 20 MG tablet - 100 day supply  Has the patient contacted their pharmacy? Yes  This is the patient's preferred pharmacy:  [facility] [id number] [place], [place] - [address] [place] [address] Phone: [phone] Fax: [phone]  Is this the correct pharmacy for this prescription? Yes If no, delete pharmacy and type the correct one.   Has the prescription been filled recently? Yes  Is the patient out of the medication? Yes  Has the patient been seen for an appointment in the last year OR does the patient have an upcoming appointment? Yes  Can we respond through MyChart? Yes  Agent: Please be advised that Rx refills may take up to 3 business days. We ask that you follow-up with your pharmacy.

## 2023-12-18 ENCOUNTER — Other Ambulatory Visit: Payer: Self-pay | Admitting: Family Medicine

## 2023-12-19 ENCOUNTER — Other Ambulatory Visit (HOSPITAL_COMMUNITY): Payer: Self-pay

## 2023-12-19 ENCOUNTER — Telehealth: Payer: Self-pay

## 2023-12-19 NOTE — Telephone Encounter (Signed)
 Pharmacy Patient Advocate Encounter  Received notification from [facility] that Prior Authorization for Tadalafil  5 has been APPROVED from 12/19/23 to 06/18/24 for 30 tablets per 30 days. Ran test claim, Copay is $13.94. This test claim was processed through [facility]- copay amounts may vary at other pharmacies due to pharmacy/plan contracts, or as the patient moves through the different stages of their insurance plan.   PA #/Case ID/Reference #: # [id number]

## 2023-12-19 NOTE — Telephone Encounter (Signed)
 Pharmacy Patient Advocate Encounter   Received notification from Onbase that prior authorization for Tadalafil  5 tabs is required/requested.   Insurance verification completed.   The patient is insured through [facility].   Per test claim: PA required; PA submitted to above mentioned insurance via Latent Key/confirmation #/EOC [id number] Status is pending

## 2024-01-03 ENCOUNTER — Other Ambulatory Visit: Payer: Self-pay | Admitting: Family Medicine

## 2024-01-03 NOTE — Telephone Encounter (Signed)
 Copied from CRM #[id number]. Topic: Clinical - Medication Refill >> Jan 03, 2024 10:06 AM Emylou G wrote: Medication: atorvastatin  (LIPITOR) 20 MG tablet  Has the patient contacted their pharmacy? No (Agent: If no, request that the patient contact the pharmacy for the refill. If patient does not wish to contact the pharmacy document the reason why and proceed with request.) (Agent: If yes, when and what did the pharmacy advise?)  This is the patient's preferred pharmacy:  [id number] [place], [place] - [address] [place] [address] Phone: [phone] Fax: [phone]  Is this the correct pharmacy for this prescription? Yes If no, delete pharmacy and type the correct one.   Has the prescription been filled recently? No  Is the patient out of the medication? Yes  Has the patient been seen for an appointment in the last year OR does the patient have an upcoming appointment? Yes  Can we respond through MyChart? Unsure  Agent: Please be advised that Rx refills may take up to 3 business days. We ask that you follow-up with your pharmacy.

## 2024-01-10 ENCOUNTER — Other Ambulatory Visit: Payer: Self-pay

## 2024-01-10 MED ORDER — ATORVASTATIN CALCIUM 20 MG PO TABS: 20.0000 mg | ORAL_TABLET | Freq: Every day | ORAL | 0 refills | Status: AC

## 2024-01-19 ENCOUNTER — Other Ambulatory Visit: Payer: Self-pay | Admitting: Family Medicine

## 2024-01-24 ENCOUNTER — Other Ambulatory Visit: Payer: Self-pay | Admitting: Family Medicine

## 2024-01-24 ENCOUNTER — Telehealth: Payer: Self-pay

## 2024-01-24 DIAGNOSIS — R1310 Dysphagia, unspecified: Secondary | ICD-10-CM

## 2024-01-24 NOTE — Telephone Encounter (Signed)
 Copied from CRM #[id number]. Topic: General - Other >> Jan 24, 2024 10:33 AM Alfonso HERO wrote: Reason for CRM: patient has issues with swallowing stated Dr Micheal referred him to a specialist for this in the past and would like to see if he can do this again. Asking for someone to reach out to him.

## 2024-01-24 NOTE — Addendum Note (Signed)
 Addended by: METTA KRISTEN CROME on: 01/24/2024 10:55 AM   Modules accepted: Orders

## 2024-01-24 NOTE — Telephone Encounter (Signed)
 Left detailed message on voicemail informing patient of message below and referral has been placed.

## 2024-02-20 ENCOUNTER — Other Ambulatory Visit: Payer: Self-pay | Admitting: Family Medicine

## 2024-02-27 ENCOUNTER — Other Ambulatory Visit: Payer: Self-pay | Admitting: Family Medicine
